# Patient Record
Sex: Female | Born: 1990 | Race: Black or African American | Hispanic: No | Marital: Single | Smoking: Never smoker
Health system: Southern US, Community
[De-identification: ages and names within clinical notes are randomized; demographics above are authoritative.]

## PROBLEM LIST (undated history)

## (undated) ENCOUNTER — Inpatient Hospital Stay (HOSPITAL_COMMUNITY): Payer: Self-pay

## (undated) DIAGNOSIS — Z3490 Encounter for supervision of normal pregnancy, unspecified, unspecified trimester: Secondary | ICD-10-CM

## (undated) DIAGNOSIS — N390 Urinary tract infection, site not specified: Secondary | ICD-10-CM

## (undated) DIAGNOSIS — F32A Depression, unspecified: Secondary | ICD-10-CM

## (undated) DIAGNOSIS — G43909 Migraine, unspecified, not intractable, without status migrainosus: Secondary | ICD-10-CM

## (undated) DIAGNOSIS — Z98891 History of uterine scar from previous surgery: Secondary | ICD-10-CM

## (undated) DIAGNOSIS — F329 Major depressive disorder, single episode, unspecified: Secondary | ICD-10-CM

## (undated) DIAGNOSIS — R87629 Unspecified abnormal cytological findings in specimens from vagina: Secondary | ICD-10-CM

## (undated) DIAGNOSIS — Z8489 Family history of other specified conditions: Secondary | ICD-10-CM

## (undated) DIAGNOSIS — B009 Herpesviral infection, unspecified: Secondary | ICD-10-CM

## (undated) DIAGNOSIS — F99 Mental disorder, not otherwise specified: Secondary | ICD-10-CM

## (undated) HISTORY — DX: Mental disorder, not otherwise specified: F99

## (undated) HISTORY — DX: Migraine, unspecified, not intractable, without status migrainosus: G43.909

## (undated) HISTORY — DX: Herpesviral infection, unspecified: B00.9

## (undated) HISTORY — DX: History of uterine scar from previous surgery: Z98.891

## (undated) HISTORY — DX: Encounter for supervision of normal pregnancy, unspecified, unspecified trimester: Z34.90

## (undated) HISTORY — DX: Unspecified abnormal cytological findings in specimens from vagina: R87.629

---
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
## 2013-08-03 LAB — OB RESULTS CONSOLE VARICELLA ZOSTER ANTIBODY, IGG: Varicella: IMMUNE

## 2013-08-03 LAB — OB RESULTS CONSOLE ABO/RH: RH TYPE: POSITIVE

## 2013-08-03 LAB — SICKLE CELL SCREEN: Sickle Cell Screen: NEGATIVE

## 2013-08-03 LAB — OB RESULTS CONSOLE HGB/HCT, BLOOD
HEMATOCRIT: 36 %
Hemoglobin: 11.9 g/dL

## 2013-08-03 LAB — OB RESULTS CONSOLE HIV ANTIBODY (ROUTINE TESTING): HIV: NONREACTIVE

## 2013-08-03 LAB — OB RESULTS CONSOLE RPR: RPR: NONREACTIVE

## 2013-08-03 LAB — OB RESULTS CONSOLE PLATELET COUNT: Platelets: 208 10*3/uL

## 2013-08-03 LAB — OB RESULTS CONSOLE TSH: TSH: 0.69

## 2013-08-03 LAB — OB RESULTS CONSOLE ANTIBODY SCREEN: Antibody Screen: NEGATIVE

## 2013-08-03 LAB — OB RESULTS CONSOLE HEPATITIS B SURFACE ANTIGEN: HEP B S AG: NEGATIVE

## 2013-08-03 LAB — OB RESULTS CONSOLE RUBELLA ANTIBODY, IGM: Rubella: IMMUNE

## 2013-08-07 LAB — HM PAP SMEAR

## 2013-08-07 LAB — OB RESULTS CONSOLE GC/CHLAMYDIA
Chlamydia: NEGATIVE
Gonorrhea: NEGATIVE

## 2013-11-22 LAB — OB RESULTS CONSOLE HGB/HCT, BLOOD
HCT: 33 %
Hemoglobin: 11.3 g/dL

## 2013-11-22 LAB — OB RESULTS CONSOLE PLATELET COUNT: PLATELETS: 190 10*3/uL

## 2014-01-23 ENCOUNTER — Encounter: Payer: Self-pay | Admitting: Advanced Practice Midwife

## 2014-01-23 ENCOUNTER — Ambulatory Visit (INDEPENDENT_AMBULATORY_CARE_PROVIDER_SITE_OTHER): Payer: Medicaid Other | Admitting: Advanced Practice Midwife

## 2014-01-23 VITALS — BP 110/66 | Ht 64.0 in | Wt 173.0 lb

## 2014-01-23 DIAGNOSIS — G43909 Migraine, unspecified, not intractable, without status migrainosus: Secondary | ICD-10-CM | POA: Insufficient documentation

## 2014-01-23 DIAGNOSIS — G43001 Migraine without aura, not intractable, with status migrainosus: Secondary | ICD-10-CM

## 2014-01-23 DIAGNOSIS — R8271 Bacteriuria: Secondary | ICD-10-CM | POA: Insufficient documentation

## 2014-01-23 DIAGNOSIS — A6000 Herpesviral infection of urogenital system, unspecified: Secondary | ICD-10-CM | POA: Insufficient documentation

## 2014-01-23 DIAGNOSIS — Z1389 Encounter for screening for other disorder: Secondary | ICD-10-CM

## 2014-01-23 DIAGNOSIS — Z3490 Encounter for supervision of normal pregnancy, unspecified, unspecified trimester: Secondary | ICD-10-CM

## 2014-01-23 DIAGNOSIS — Z3403 Encounter for supervision of normal first pregnancy, third trimester: Secondary | ICD-10-CM

## 2014-01-23 DIAGNOSIS — Z3400 Encounter for supervision of normal first pregnancy, unspecified trimester: Secondary | ICD-10-CM

## 2014-01-23 DIAGNOSIS — Z331 Pregnant state, incidental: Secondary | ICD-10-CM

## 2014-01-23 DIAGNOSIS — R011 Cardiac murmur, unspecified: Secondary | ICD-10-CM | POA: Insufficient documentation

## 2014-01-23 LAB — POCT URINALYSIS DIPSTICK
Blood, UA: NEGATIVE
Glucose, UA: NEGATIVE
Ketones, UA: NEGATIVE
LEUKOCYTES UA: NEGATIVE
NITRITE UA: NEGATIVE
PROTEIN UA: NEGATIVE

## 2014-01-23 NOTE — Progress Notes (Signed)
  Subjective:    Yvonne Stone is a G1P0 [id number] being seen today for her first obstetrical visit. She has just moved here from [place] where [place] has received care.  Her obstetrical history is significant for group B strep colonizer in urine this pregnancy.  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  Her 2 hr gtt was normal.  Normal anatomy scan.  Has had a few outbreaks of gential HSV2, on suppression therapy.  Pap showed LGSIL, recommended post partum evaluation.   Patient reports stuffy nose with some yellow drainage and some ear discomfort for 2-3 days  Filed Vitals:   01/23/14 1527 01/23/14 1531  BP: 110/66   Height:  5\' 4"  (1.626 m)  Weight: 78.472 kg (173 lb)     HISTORY: OB History  Gravida Para Term Preterm AB SAB TAB Ectopic Multiple Living  2    1 1         # Outcome Date GA Lbr Len/2nd Weight Sex Delivery Anes PTL Lv  2 CUR           1 SAB              Past Medical History  Diagnosis Date  . Migraines   . Vaginal Pap smear, abnormal    History reviewed. No pertinent past surgical history. Family History  Problem Relation Age of Onset  . Heart murmur Mother   . Hypertension Mother   . Diabetes Mother   . Heart murmur Sister   . Cancer Maternal Grandmother     breast   . Heart murmur Sister      Exam                                       HEENT:  Ears clear, throat is not red   Skin: normal coloration and turgor, no rashes    Neurologic: oriented, normal, normal mood   Extremities: normal strength, tone, and muscle mass   HEENT PERRLA   Mouth/Teeth mucous membranes moist, normal dentition   Neck supple and no masses   Cardiovascular: regular rate and rhythm   Respiratory:  appears well, vitals  normal, no respiratory distress, acyanotic   Abdomen: soft, non-tender;  FHR: 160          Assessment:    Pregnancy: G1P0 Patient Active Problem List   Diagnosis Date Noted  . 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 Heart murmur 01/23/2014  . Pregnancy 01/23/2014  . Group B streptococcal bacteriuria 01/23/2014  . Migraines         Plan:     Request records from cardiology consult Continue prenatal vitamins and acyclovir 400mg  TID Problem list reviewed and updated  If still URI sx Monday call for Abx  Follow up in 1 weeks for Low-risk ob appt   CRESENZO-DISHMAN,Donshay Lupinski 01/24/2014

## 2014-01-23 NOTE — Progress Notes (Signed)
Pt denies any problems or concerns at this time. Pt here for her New OB visit, [place] Pt states that she has had headache and brown mucus when blowing her nose and coughing, pt has had this for about 2 days. Pt was unsure of what she could take.

## 2014-01-27 ENCOUNTER — Other Ambulatory Visit: Payer: Self-pay | Admitting: *Deleted

## 2014-01-27 ENCOUNTER — Telehealth: Payer: Self-pay | Admitting: *Deleted

## 2014-01-27 ENCOUNTER — Telehealth: Payer: Self-pay | Admitting: Adult Health

## 2014-01-27 MED ORDER — PRENATAL 27-0.8 MG PO TABS: 1.0000 | ORAL_TABLET | Freq: Every day | ORAL | Status: DC

## 2014-01-27 NOTE — Telephone Encounter (Signed)
Spoke with pt. Pt is requesting a Rx for Valtrex 500 mg. Also, the cardiology office from [facility] is [facility] [address], [place], [place], [address]. Phone # [phone] and fax # [phone]. Thanks!!! [employer]

## 2014-01-27 NOTE — Telephone Encounter (Signed)
Refill request sent to [facility] for prenatal vits. [facility]

## 2014-01-28 ENCOUNTER — Encounter: Payer: Self-pay | Admitting: *Deleted

## 2014-01-28 ENCOUNTER — Telehealth: Payer: Self-pay | Admitting: *Deleted

## 2014-01-28 DIAGNOSIS — R87619 Unspecified abnormal cytological findings in specimens from cervix uteri: Secondary | ICD-10-CM | POA: Insufficient documentation

## 2014-01-28 DIAGNOSIS — R87612 Low grade squamous intraepithelial lesion on cytologic smear of cervix (LGSIL): Secondary | ICD-10-CM

## 2014-01-28 DIAGNOSIS — A6000 Herpesviral infection of urogenital system, unspecified: Secondary | ICD-10-CM

## 2014-01-28 DIAGNOSIS — R011 Cardiac murmur, unspecified: Secondary | ICD-10-CM

## 2014-01-28 DIAGNOSIS — Z3490 Encounter for supervision of normal pregnancy, unspecified, unspecified trimester: Secondary | ICD-10-CM

## 2014-01-28 DIAGNOSIS — R8271 Bacteriuria: Secondary | ICD-10-CM

## 2014-01-28 MED ORDER — VALACYCLOVIR HCL 1 G PO TABS: 500.0000 mg | ORAL_TABLET | Freq: Two times a day (BID) | ORAL | Status: DC

## 2014-01-28 NOTE — Telephone Encounter (Signed)
Request for records regarding cardiology consult faxed; valtrex  BID #[id number]

## 2014-01-28 NOTE — Telephone Encounter (Signed)
Pt called confused as to how many times a day she should be taking her Valtrex.  She states the rx bottle says to take it twice a day and Drenda Freeze told her to take it 3 X daily.  I spoke with Drenda Freeze and she states she was going to give the pt acyclovir that is 3 times daily but the pt wanted Valtrex so that is what she sent to the pharmacy and that medication is only 2 X daily, the patient states she is very confused and that since she left here last Thursday she has been taking the Valtrex 3 times a day.  I informed her that that is fine but to start taking just twice a day as prescribed.  Pt still confused so she put her mother on the phone and I explained it to her.  Pts mother verbalized understanding and repeated back to me that she will only take the medication twice daily from now on.

## 2014-01-30 ENCOUNTER — Encounter: Payer: Self-pay | Admitting: Obstetrics & Gynecology

## 2014-04-08 ENCOUNTER — Encounter: Payer: Self-pay | Admitting: *Deleted

## 2014-11-28 ENCOUNTER — Encounter (HOSPITAL_COMMUNITY): Payer: Self-pay | Admitting: *Deleted

## 2015-11-23 LAB — OB RESULTS CONSOLE GC/CHLAMYDIA
CHLAMYDIA, DNA PROBE: NEGATIVE
Chlamydia: NEGATIVE
GC PROBE AMP, GENITAL: NEGATIVE
Gonorrhea: NEGATIVE

## 2015-12-01 ENCOUNTER — Other Ambulatory Visit: Payer: Self-pay | Admitting: Obstetrics & Gynecology

## 2015-12-01 DIAGNOSIS — O3680X0 Pregnancy with inconclusive fetal viability, not applicable or unspecified: Secondary | ICD-10-CM

## 2015-12-02 ENCOUNTER — Encounter: Payer: Self-pay | Admitting: *Deleted

## 2015-12-03 ENCOUNTER — Ambulatory Visit (INDEPENDENT_AMBULATORY_CARE_PROVIDER_SITE_OTHER): Payer: Medicaid Other

## 2015-12-03 DIAGNOSIS — O3680X0 Pregnancy with inconclusive fetal viability, not applicable or unspecified: Secondary | ICD-10-CM | POA: Diagnosis not present

## 2015-12-03 DIAGNOSIS — Z3A01 Less than 8 weeks gestation of pregnancy: Secondary | ICD-10-CM | POA: Diagnosis not present

## 2015-12-03 NOTE — Progress Notes (Signed)
US 6+6 wks,single IUP w/ys,pos fht 131 bpm,normal ov's bilat,crl 10.1 mm

## 2015-12-16 ENCOUNTER — Other Ambulatory Visit (HOSPITAL_COMMUNITY)
Admission: RE | Admit: 2015-12-16 | Discharge: 2015-12-16 | Disposition: A | Discharge status: Home / Self Care | Payer: Medicaid Other | Source: Ambulatory Visit | Attending: Adult Health | Admitting: Adult Health

## 2015-12-16 ENCOUNTER — Ambulatory Visit (INDEPENDENT_AMBULATORY_CARE_PROVIDER_SITE_OTHER): Payer: Medicaid Other | Admitting: Adult Health

## 2015-12-16 ENCOUNTER — Encounter: Payer: Self-pay | Admitting: Adult Health

## 2015-12-16 VITALS — BP 106/80 | HR 84 | Wt 159.0 lb

## 2015-12-16 DIAGNOSIS — Z0283 Encounter for blood-alcohol and blood-drug test: Secondary | ICD-10-CM

## 2015-12-16 DIAGNOSIS — Z3490 Encounter for supervision of normal pregnancy, unspecified, unspecified trimester: Secondary | ICD-10-CM

## 2015-12-16 DIAGNOSIS — Z1389 Encounter for screening for other disorder: Secondary | ICD-10-CM

## 2015-12-16 DIAGNOSIS — Z331 Pregnant state, incidental: Secondary | ICD-10-CM | POA: Diagnosis not present

## 2015-12-16 DIAGNOSIS — Z01419 Encounter for gynecological examination (general) (routine) without abnormal findings: Secondary | ICD-10-CM | POA: Diagnosis present

## 2015-12-16 DIAGNOSIS — Z3A09 9 weeks gestation of pregnancy: Secondary | ICD-10-CM

## 2015-12-16 DIAGNOSIS — Z113 Encounter for screening for infections with a predominantly sexual mode of transmission: Secondary | ICD-10-CM | POA: Diagnosis present

## 2015-12-16 DIAGNOSIS — Z3481 Encounter for supervision of other normal pregnancy, first trimester: Secondary | ICD-10-CM | POA: Diagnosis not present

## 2015-12-16 DIAGNOSIS — Z3491 Encounter for supervision of normal pregnancy, unspecified, first trimester: Secondary | ICD-10-CM

## 2015-12-16 DIAGNOSIS — Z3682 Encounter for antenatal screening for nuchal translucency: Secondary | ICD-10-CM

## 2015-12-16 DIAGNOSIS — Z369 Encounter for antenatal screening, unspecified: Secondary | ICD-10-CM

## 2015-12-16 DIAGNOSIS — Z98891 History of uterine scar from previous surgery: Secondary | ICD-10-CM

## 2015-12-16 HISTORY — DX: Encounter for supervision of normal pregnancy, unspecified, unspecified trimester: Z34.90

## 2015-12-16 HISTORY — DX: History of uterine scar from previous surgery: Z98.891

## 2015-12-16 LAB — POCT URINALYSIS DIPSTICK
Glucose, UA: NEGATIVE
Ketones, UA: NEGATIVE
Leukocytes, UA: NEGATIVE
NITRITE UA: NEGATIVE

## 2015-12-16 NOTE — Patient Instructions (Signed)
Second Trimester of Pregnancy The second trimester is from week 13 through week 28, months 4 through 6. The second trimester is often a time when you feel your best. Your body has also adjusted to being pregnant, and you begin to feel better physically. Usually, morning sickness has lessened or quit completely, you may have more energy, and you may have an increase in appetite. The second trimester is also a time when the fetus is growing rapidly. At the end of the sixth month, the fetus is about 9 inches long and weighs about 1 pounds. You will likely begin to feel the baby move (quickening) between 18 and 20 weeks of the pregnancy. BODY CHANGES Your body goes through many changes during pregnancy. The changes vary from woman to woman.   Your weight will continue to increase. You will notice your lower abdomen bulging out.  You may begin to get stretch marks on your hips, abdomen, and breasts.  You may develop headaches that can be relieved by medicines approved by your health care provider.  You may urinate more often because the fetus is pressing on your bladder.  You may develop or continue to have heartburn as a result of your pregnancy.  You may develop constipation because certain hormones are causing the muscles that push waste through your intestines to slow down.  You may develop hemorrhoids or swollen, bulging veins (varicose veins).  You may have back pain because of the weight gain and pregnancy hormones relaxing your joints between the bones in your pelvis and as a result of a shift in weight and the muscles that support your balance.  Your breasts will continue to grow and be tender.  Your gums may bleed and may be sensitive to brushing and flossing.  Dark spots or blotches (chloasma, mask of pregnancy) may develop on your face. This will likely fade after the baby is born.  A dark line from your belly button to the pubic area (linea nigra) may appear. This will likely fade  after the baby is born.  You may have changes in your hair. These can include thickening of your hair, rapid growth, and changes in texture. Some women also have hair loss during or after pregnancy, or hair that feels dry or thin. Your hair will most likely return to normal after your baby is born. WHAT TO EXPECT AT YOUR PRENATAL VISITS During a routine prenatal visit:  You will be weighed to make sure you and the fetus are growing normally.  Your blood pressure will be taken.  Your abdomen will be measured to track your baby's growth.  The fetal heartbeat will be listened to.  Any test results from the previous visit will be discussed. Your health care provider may ask you:  How you are feeling.  If you are feeling the baby move.  If you have had any abnormal symptoms, such as leaking fluid, bleeding, severe headaches, or abdominal cramping.  If you are using any tobacco products, including cigarettes, chewing tobacco, and electronic cigarettes.  If you have any questions. Other tests that may be performed during your second trimester include:  Blood tests that check for:  Low iron levels (anemia).  Gestational diabetes (between 24 and 28 weeks).  Rh antibodies.  Urine tests to check for infections, diabetes, or protein in the urine.  An ultrasound to confirm the proper growth and development of the baby.  An amniocentesis to check for possible genetic problems.  Fetal screens for spina bifida   and Down syndrome.  HIV (human immunodeficiency virus) testing. Routine prenatal testing includes screening for HIV, unless you choose not to have this test. HOME CARE INSTRUCTIONS   Avoid all smoking, herbs, alcohol, and unprescribed drugs. These chemicals affect the formation and growth of the baby.  Do not use any tobacco products, including cigarettes, chewing tobacco, and electronic cigarettes. If you need help quitting, ask your health care provider. You may receive  counseling support and other resources to help you quit.  Follow your health care provider's instructions regarding medicine use. There are medicines that are either safe or unsafe to take during pregnancy.  Exercise only as directed by your health care provider. Experiencing uterine cramps is a good sign to stop exercising.  Continue to eat regular, healthy meals.  Wear a good support bra for breast tenderness.  Do not use hot tubs, steam rooms, or saunas.  Wear your seat belt at all times when driving.  Avoid raw meat, uncooked cheese, cat litter boxes, and soil used by cats. These carry germs that can cause birth defects in the baby.  Take your prenatal vitamins.  Take 1500-2000 mg of calcium daily starting at the 20th week of pregnancy until you deliver your baby.  Try taking a stool softener (if your health care provider approves) if you develop constipation. Eat more high-fiber foods, such as fresh vegetables or fruit and whole grains. Drink plenty of fluids to keep your urine clear or pale yellow.  Take warm sitz baths to soothe any pain or discomfort caused by hemorrhoids. Use hemorrhoid cream if your health care provider approves.  If you develop varicose veins, wear support hose. Elevate your feet for 15 minutes, 3-4 times a day. Limit salt in your diet.  Avoid heavy lifting, wear low heel shoes, and practice good posture.  Rest with your legs elevated if you have leg cramps or low back pain.  Visit your dentist if you have not gone yet during your pregnancy. Use a soft toothbrush to brush your teeth and be gentle when you floss.  A sexual relationship may be continued unless your health care provider directs you otherwise.  Continue to go to all your prenatal visits as directed by your health care provider. SEEK MEDICAL CARE IF:   You have dizziness.  You have mild pelvic cramps, pelvic pressure, or nagging pain in the abdominal area.  You have persistent nausea,  vomiting, or diarrhea.  You have a bad smelling vaginal discharge.  You have pain with urination. SEEK IMMEDIATE MEDICAL CARE IF:   You have a fever.  You are leaking fluid from your vagina.  You have spotting or bleeding from your vagina.  You have severe abdominal cramping or pain.  You have rapid weight gain or loss.  You have shortness of breath with chest pain.  You notice sudden or extreme swelling of your face, hands, ankles, feet, or legs.  You have not felt your baby move in over an hour.  You have severe headaches that do not go away with medicine.  You have vision changes.   This information is not intended to replace advice given to you by your health care provider. Make sure you discuss any questions you have with your health care provider.   Document Released: 05/17/2001 Document Revised: 06/13/2014 Document Reviewed: 07/24/2012 Elsevier Interactive Patient Education 2016 [place] of Pregnancy The first trimester of pregnancy is from week 1 until the end of week 12 (months 1 through  3). A week after a sperm fertilizes an egg, the egg will implant on the wall of the uterus. This embryo will begin to develop into a baby. Genes from you and your partner are forming the baby. The female genes determine whether the baby is a boy or a girl. At 6-8 weeks, the eyes and face are formed, and the heartbeat can be seen on ultrasound. At the end of 12 weeks, all the baby's organs are formed.  Now that you are pregnant, you will want to do everything you can to have a healthy baby. Two of the most important things are to get good prenatal care and to follow your health care provider's instructions. Prenatal care is all the medical care you receive before the baby's birth. This care will help prevent, find, and treat any problems during the pregnancy and childbirth. BODY CHANGES Your body goes through many changes during pregnancy. The changes vary from woman to  woman.   You may gain or lose a couple of pounds at first.  You may feel sick to your stomach (nauseous) and throw up (vomit). If the vomiting is uncontrollable, call your health care provider.  You may tire easily.  You may develop headaches that can be relieved by medicines approved by your health care provider.  You may urinate more often. Painful urination may mean you have a bladder infection.  You may develop heartburn as a result of your pregnancy.  You may develop constipation because certain hormones are causing the muscles that push waste through your intestines to slow down.  You may develop hemorrhoids or swollen, bulging veins (varicose veins).  Your breasts may begin to grow larger and become tender. Your nipples may stick out more, and the tissue that surrounds them (areola) may become darker.  Your gums may bleed and may be sensitive to brushing and flossing.  Dark spots or blotches (chloasma, mask of pregnancy) may develop on your face. This will likely fade after the baby is born.  Your menstrual periods will stop.  You may have a loss of appetite.  You may develop cravings for certain kinds of food.  You may have changes in your emotions from day to day, such as being excited to be pregnant or being concerned that something may go wrong with the pregnancy and baby.  You may have more vivid and strange dreams.  You may have changes in your hair. These can include thickening of your hair, rapid growth, and changes in texture. Some women also have hair loss during or after pregnancy, or hair that feels dry or thin. Your hair will most likely return to normal after your baby is born. WHAT TO EXPECT AT YOUR PRENATAL VISITS During a routine prenatal visit:  You will be weighed to make sure you and the baby are growing normally.  Your blood pressure will be taken.  Your abdomen will be measured to track your baby's growth.  The fetal heartbeat will be listened  to starting around week 10 or 12 of your pregnancy.  Test results from any previous visits will be discussed. Your health care provider may ask you:  How you are feeling.  If you are feeling the baby move.  If you have had any abnormal symptoms, such as leaking fluid, bleeding, severe headaches, or abdominal cramping.  If you are using any tobacco products, including cigarettes, chewing tobacco, and electronic cigarettes.  If you have any questions. Other tests that may be performed during your  first trimester include:  Blood tests to find your blood type and to check for the presence of any previous infections. They will also be used to check for low iron levels (anemia) and Rh antibodies. Later in the pregnancy, blood tests for diabetes will be done along with other tests if problems develop.  Urine tests to check for infections, diabetes, or protein in the urine.  An ultrasound to confirm the proper growth and development of the baby.  An amniocentesis to check for possible genetic problems.  Fetal screens for spina bifida and Down syndrome.  You may need other tests to make sure you and the baby are doing well.  HIV (human immunodeficiency virus) testing. Routine prenatal testing includes screening for HIV, unless you choose not to have this test. HOME CARE INSTRUCTIONS  Medicines  Follow your health care provider's instructions regarding medicine use. Specific medicines may be either safe or unsafe to take during pregnancy.  Take your prenatal vitamins as directed.  If you develop constipation, try taking a stool softener if your health care provider approves. Diet  Eat regular, well-balanced meals. Choose a variety of foods, such as meat or vegetable-based protein, fish, milk and low-fat dairy products, vegetables, fruits, and whole grain breads and cereals. Your health care provider will help you determine the amount of weight gain that is right for you.  Avoid raw  meat and uncooked cheese. These carry germs that can cause birth defects in the baby.  Eating four or five small meals rather than three large meals a day may help relieve nausea and vomiting. If you start to feel nauseous, eating a few soda crackers can be helpful. Drinking liquids between meals instead of during meals also seems to help nausea and vomiting.  If you develop constipation, eat more high-fiber foods, such as fresh vegetables or fruit and whole grains. Drink enough fluids to keep your urine clear or pale yellow. Activity and Exercise  Exercise only as directed by your health care provider. Exercising will help you:  Control your weight.  Stay in shape.  Be prepared for labor and delivery.  Experiencing pain or cramping in the lower abdomen or low back is a good sign that you should stop exercising. Check with your health care provider before continuing normal exercises.  Try to avoid standing for long periods of time. Move your legs often if you must stand in one place for a long time.  Avoid heavy lifting.  Wear low-heeled shoes, and practice good posture.  You may continue to have sex unless your health care provider directs you otherwise. Relief of Pain or Discomfort  Wear a good support bra for breast tenderness.   Take warm sitz baths to soothe any pain or discomfort caused by hemorrhoids. Use hemorrhoid cream if your health care provider approves.   Rest with your legs elevated if you have leg cramps or low back pain.  If you develop varicose veins in your legs, wear support hose. Elevate your feet for 15 minutes, 3-4 times a day. Limit salt in your diet. Prenatal Care  Schedule your prenatal visits by the twelfth week of pregnancy. They are usually scheduled monthly at first, then more often in the last 2 months before delivery.  Write down your questions. Take them to your prenatal visits.  Keep all your prenatal visits as directed by your health care  provider. Safety  Wear your seat belt at all times when driving.  Make a list of emergency phone  numbers, including numbers for family, friends, the hospital, and police and fire departments. General Tips  Ask your health care provider for a referral to a local prenatal education class. Begin classes no later than at the beginning of month 6 of your pregnancy.  Ask for help if you have counseling or nutritional needs during pregnancy. Your health care provider can offer advice or refer you to specialists for help with various needs.  Do not use hot tubs, steam rooms, or saunas.  Do not douche or use tampons or scented sanitary pads.  Do not cross your legs for long periods of time.  Avoid cat litter boxes and soil used by cats. These carry germs that can cause birth defects in the baby and possibly loss of the fetus by miscarriage or stillbirth.  Avoid all smoking, herbs, alcohol, and medicines not prescribed by your health care provider. Chemicals in these affect the formation and growth of the baby.  Do not use any tobacco products, including cigarettes, chewing tobacco, and electronic cigarettes. If you need help quitting, ask your health care provider. You may receive counseling support and other resources to help you quit.  Schedule a dentist appointment. At home, brush your teeth with a soft toothbrush and be gentle when you floss. SEEK MEDICAL CARE IF:   You have dizziness.  You have mild pelvic cramps, pelvic pressure, or nagging pain in the abdominal area.  You have persistent nausea, vomiting, or diarrhea.  You have a bad smelling vaginal discharge.  You have pain with urination.  You notice increased swelling in your face, hands, legs, or ankles. SEEK IMMEDIATE MEDICAL CARE IF:   You have a fever.  You are leaking fluid from your vagina.  You have spotting or bleeding from your vagina.  You have severe abdominal cramping or pain.  You have rapid weight gain  or loss.  You vomit blood or material that looks like coffee grounds.  You are exposed to [place] and have never had them.  You are exposed to fifth disease or chickenpox.  You develop a severe headache.  You have shortness of breath.  You have any kind of trauma, such as from a fall or a car accident.   This information is not intended to replace advice given to you by your health care provider. Make sure you discuss any questions you have with your health care provider.   Document Released: 05/17/2001 Document Revised: 06/13/2014 Document Reviewed: 04/02/2013 Elsevier Interactive Patient Education [employer] Return in 4 weeks for It/NT and see Dr Emelda FearFerguson

## 2015-12-16 NOTE — Progress Notes (Signed)
Subjective:  Yvonne Stone is a 25 y.o. 193P1011 African American female at 241w5d by US being seen today for her first obstetrical visit.  Her obstetrical history is significant for had Csection with priod delivery for FTD and fetal distressalso had postpartun depression.  Pregnancy history fully reviewed.  Patient reports no complaints. Denies vb, cramping, uti s/s, abnormal/malodorous vag d/c, or vulvovaginal irritation, has had some itching and used monistat.Has some pain in stomach at times.  BP 106/80 mmHg  Pulse 84  Wt 159 lb (72.122 kg)  LMP 08/19/2015 (Approximate)  HISTORY: OB History  Gravida Para Term Preterm AB SAB TAB Ectopic Multiple Living  3 1 1  1 1    1     # Outcome Date GA Lbr Len/2nd Weight Sex Delivery Anes PTL Lv  3 Current           2 Term 02/22/14 1286w6d  6 lb 10 oz (3.005 kg) M CS-LTranv   Y  1 SAB              Past Medical History  Diagnosis Date  . Migraines   . Vaginal Pap smear, abnormal   . Herpes     genital  . Mental disorder     postpartum depression  . Supervision of normal pregnancy 12/16/2015     [facility] at   8+5 weeks FOB  Corwin Levinswilliam smith 25 yo bm 11th child Dating By  US Pap  12/16/15 GC/CT Initial:                36+wks: Genetic Screen NT/IT:  CF screen  Anatomic US  Flu vaccine  Tdap Recommended ~ 28wks Glucose Screen  2 hr GBS  Feed Preference  Contraception  Circumcision  Childbirth Classes  Pediatrician     History reviewed. No pertinent past surgical history. Family History  Problem Relation Age of Onset  . Heart murmur Mother   . Hypertension Mother   . Diabetes Mother   . Thyroid disease Mother   . Heart murmur Sister   . Cancer Maternal Grandmother     breast   . Hypertension Maternal Grandmother   . Heart murmur Sister   . Hypertension Father   . Hypertension Maternal Grandfather   . Thyroid disease Maternal Aunt   . Hypertension Maternal Aunt   . Diabetes Maternal Aunt   . Diabetes Maternal Uncle    . Hypertension Maternal Uncle   . Diabetes Paternal Aunt   . Hypertension Paternal Aunt   . Hypertension Paternal Uncle   . Diabetes Paternal Uncle   . Seizures Paternal Grandmother   . Stroke Paternal Grandmother   . Hypertension Paternal Grandmother   . Diabetes Paternal Grandfather   . Hypertension Paternal Grandfather     Exam   System:     General: Well developed & nourished, no acute distress   Skin: Warm & dry, normal coloration and turgor, no rashes   Neurologic: Alert & oriented, normal mood   Cardiovascular: Regular rate & rhythm   Respiratory: Effort & rate normal, LCTAB, acyanotic   Abdomen: Soft, non tender   Extremities: normal strength, tone   Pelvic Exam:    Perineum: Normal perineum   Vulva: Normal, no lesions   Vagina:  Normal mucosa, normal discharge   Cervix: Normal, bulbous, appears closed   Uterus: Normal size/shape/contour for GA   Thin prep pap smear with GC/CHl done. FHR: 171 via doppler.   Assessment:   Pregnancy: G3P1011 Patient Active  Problem List   Diagnosis Date Noted  . Supervision of normal pregnancy 12/16/2015  . 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 Heart murmur 01/23/2014  . Pregnancy 01/23/2014  . Group B streptococcal bacteriuria 01/23/2014  . Migraines     [redacted]w[redacted]d G3P1011 New OB visit     Plan:  Initial labs drawn Continue prenatal vitamins Problem list reviewed and updated Reviewed n/v relief measures and warning s/s to report Reviewed recommended weight gain based on pre-gravid BMI Encouraged well-balanced diet Genetic Screening discussed Integrated Screen: requested Cystic fibrosis screening discussed requested Ultrasound discussed; fetal survey: requested Follow up in 4 weeks for IT/NT and see Dr Emelda Fear  Adline Potter, NP 12/16/2015 11:41 AM

## 2015-12-17 LAB — URINE CULTURE

## 2015-12-17 LAB — CYTOLOGY - PAP

## 2015-12-24 LAB — CYSTIC FIBROSIS MUTATION 97: GENE DIS ANAL CARRIER INTERP BLD/T-IMP: NOT DETECTED

## 2015-12-24 LAB — PMP SCREEN PROFILE (10S), URINE
Amphetamine Screen, Ur: NEGATIVE ng/mL
BARBITURATE SCRN UR: NEGATIVE ng/mL
BENZODIAZEPINE SCREEN, URINE: NEGATIVE ng/mL
CREATININE(CRT), U: 247.7 mg/dL (ref 20.0–300.0)
Cannabinoids Ur Ql Scn: NEGATIVE ng/mL
Cocaine(Metab.)Screen, Urine: NEGATIVE ng/mL
METHADONE SCREEN, URINE: NEGATIVE ng/mL
Opiate Scrn, Ur: NEGATIVE ng/mL
Oxycodone+Oxymorphone Ur Ql Scn: NEGATIVE ng/mL
PCP Scrn, Ur: NEGATIVE ng/mL
PH UR, DRUG SCRN: 6.1 (ref 4.5–8.9)
PROPOXYPHENE SCREEN: NEGATIVE ng/mL

## 2015-12-24 LAB — CBC
HEMOGLOBIN: 11.8 g/dL (ref 11.1–15.9)
Hematocrit: 35.8 % (ref 34.0–46.6)
MCH: 26.9 pg (ref 26.6–33.0)
MCHC: 33 g/dL (ref 31.5–35.7)
MCV: 82 fL (ref 79–97)
Platelets: 237 10*3/uL (ref 150–379)
RBC: 4.38 x10E6/uL (ref 3.77–5.28)
RDW: 13.7 % (ref 12.3–15.4)
WBC: 5.1 10*3/uL (ref 3.4–10.8)

## 2015-12-24 LAB — URINALYSIS, ROUTINE W REFLEX MICROSCOPIC
Bilirubin, UA: NEGATIVE
GLUCOSE, UA: NEGATIVE
KETONES UA: NEGATIVE
Leukocytes, UA: NEGATIVE
NITRITE UA: NEGATIVE
RBC, UA: NEGATIVE
UUROB: 0.2 mg/dL (ref 0.2–1.0)
pH, UA: 6.5 (ref 5.0–7.5)

## 2015-12-24 LAB — VARICELLA ZOSTER ANTIBODY, IGG: Varicella zoster IgG: 866 index (ref >165)

## 2015-12-24 LAB — RUBELLA SCREEN: RUBELLA: 2.06 {index} (ref >0.99)

## 2015-12-24 LAB — ABO/RH: Rh Factor: POSITIVE

## 2015-12-24 LAB — HIV ANTIBODY (ROUTINE TESTING W REFLEX): HIV SCREEN 4TH GENERATION: NONREACTIVE

## 2015-12-24 LAB — RPR: RPR Ser Ql: NONREACTIVE

## 2015-12-24 LAB — HEPATITIS B SURFACE ANTIGEN: HEP B S AG: NEGATIVE

## 2015-12-24 LAB — ANTIBODY SCREEN: ANTIBODY SCREEN: NEGATIVE

## 2015-12-31 ENCOUNTER — Telehealth: Payer: Self-pay | Admitting: *Deleted

## 2015-12-31 NOTE — Telephone Encounter (Signed)
Spoke with pt. Pt states she hasn't had a BM in 3 days. I advised to eat 4 prunes daily or drink prune juice and drink plenty of other fluids as well. Pt was advised to let us know if this don't help. Pt voiced understanding. JSY

## 2016-01-13 ENCOUNTER — Ambulatory Visit (INDEPENDENT_AMBULATORY_CARE_PROVIDER_SITE_OTHER): Payer: Medicaid Other | Admitting: Obstetrics and Gynecology

## 2016-01-13 ENCOUNTER — Ambulatory Visit (INDEPENDENT_AMBULATORY_CARE_PROVIDER_SITE_OTHER): Payer: Medicaid Other

## 2016-01-13 VITALS — BP 92/70 | HR 84 | Wt 162.3 lb

## 2016-01-13 DIAGNOSIS — Z3A13 13 weeks gestation of pregnancy: Secondary | ICD-10-CM

## 2016-01-13 DIAGNOSIS — Z3491 Encounter for supervision of normal pregnancy, unspecified, first trimester: Secondary | ICD-10-CM

## 2016-01-13 DIAGNOSIS — Z3492 Encounter for supervision of normal pregnancy, unspecified, second trimester: Secondary | ICD-10-CM

## 2016-01-13 DIAGNOSIS — Z3682 Encounter for antenatal screening for nuchal translucency: Secondary | ICD-10-CM

## 2016-01-13 DIAGNOSIS — Z1389 Encounter for screening for other disorder: Secondary | ICD-10-CM

## 2016-01-13 DIAGNOSIS — Z36 Encounter for antenatal screening of mother: Secondary | ICD-10-CM | POA: Diagnosis not present

## 2016-01-13 DIAGNOSIS — Z331 Pregnant state, incidental: Secondary | ICD-10-CM

## 2016-01-13 LAB — POCT URINALYSIS DIPSTICK
Blood, UA: NEGATIVE
Glucose, UA: NEGATIVE
KETONES UA: NEGATIVE
LEUKOCYTES UA: NEGATIVE
Nitrite, UA: NEGATIVE
PROTEIN UA: NEGATIVE

## 2016-01-13 NOTE — Patient Instructions (Signed)
25 y.o. at 323w5d with Estimated Date of Delivery: 07/22/16 was seen today in office to discuss trial of labor after cesarean section (TOLAC) versus elective repeat cesarean delivery (ERCD).   The patient has a previous obstetric history [facility] after reaching completely dilated but being unable to deliver vaginally after several hours..   The following risks were discussed with the patient: Risk of uterine rupture at term is 0.78 percent with TOLAC,   0.22 percent with ERCD.   1 in 10 uterine ruptures will result in neonatal death or neurological injury. The benefits of TOLAC resulting in a vaginal birth after cesarean (VBAC) include the following: shorter length of hospital stay and postpartum recovery (in most cases);                 fewer complications, such as postpartum fever, wound or uterine infection,  thromboembolism (blood clots in the leg or lung), need for blood transfusion and fewer neonatal breathing problems. The risks of an attempted TOLAC include the following: Risk of failed TOLAC without a vaginal birth after cesarean (VBAC) resulting in repeat cesarean delivery in about 20 to 40 percent of women who attempt VBAC.  Risk of rupture of uterus resulting in an emergency cesarean delivery. The risk of uterine rupture may be related in part to the type of uterine incision made during the first cesarean delivery. A previous transverse uterine incision has the lowest risk of rupture (0.2 to 1.5 percent risk). Vertical or T-shaped uterine incisions have a higher risk of uterine rupture (4 to 9 percent risk). The risk of fetal death is very low with both VBAC and elective repeat cesarean delivery, but the likelihood of fetal death is higher with VBAC than with ERCD. Maternal death is very rare with either type of delivery.  The risks of a repeat cesarean section were reviewed with the patient including but not limited to: 07/998 risk of uterine rupture which  could have serious consequences, bleeding which may require transfusion; infection which may require antibiotics; injury to bowel, bladder or other surrounding organs (bowel, bladder, ureters); injury to the fetus; need for additional procedures including hysterectomy in the event of a life-threatening hemorrhage; thromboembolic phenomenon, incisional problems and other postoperative/anesthesia complications.    Once  All her questionsare answered and she will sign a consent indicating a preference for repeat cesarean section, or for TOLAC. curently she is more likely to request repeat cesarean section

## 2016-01-13 NOTE — Progress Notes (Signed)
US 12+5 wks,measurements c/w dates,normal ov's bilat,NB present,NT 1.6 mm,crl 66.429mm,fhr 153 bpm,post pl gr 0

## 2016-01-13 NOTE — Progress Notes (Signed)
Patient ID: Yvonne Stone, female   DOB: 11-Feb-1991, 25 y.o.   MRN: [id number]  [id number]  Estimated Date of Delivery: 07/22/16 LROB 1239w5d  Blood pressure 92/70, pulse 84, weight 162 lb 4.8 oz (73.6 kg), last menstrual period 08/19/2015, unknown if currently breastfeeding.    Urine results: notable for none  Chief Complaint  Patient presents with  . Routine Prenatal Visit    NT IT today    Patient complaints: none. Pt reports she is considering a repeat cesarean section. She reports [facility]/t failure to descend and fetal distress.   Patient reports too early for fetal movement. She denies any bleeding, rupture of membranes, or regular contractions.  Refer to the ob flow sheet for FH and FHR.    Physical Examination: General appearance - alert, well appearing, and in no distress                                      Abdomen - FHR 153 bpm                                                         soft, nontender, nondistended, no masses or organomegaly  Discussed with pt risks and benefits of VBAC vs repeat cesarean section. At end of discussion, pt had opportunity to ask questions and has no further questions at this time. She reports she is still considering repeat c-section at this time.   Greater than 50% was spent in counseling and coordination of care with the patient. Total time greater than: 25 minutes                                           Questions were answered. Assessment: LROB G3P1011 @ 7039w5d NT/IT today   Plan:  Continued routine obstetrical care Lactation consultant and childbirth classes information provided  [facility] pertaining to prior Cesarean section Internal exam at next visit to assess pelvic bones   Pt provided with VBAC informed consent handout   F/u in 4 weeks for routine prenatal care, internal exam  Once  All her questionsare answered and she will sign a consent indicating a preference  for repeat cesarean section, or for TOLAC. curently she is more likely to request repeat cesarean section      By signing my name below, I, Doreatha MartinEva Mathews, attest that this documentation has been prepared under the direction and in the presence of Tilda BurrowJohn V Taneil Lazarus, MD. Electronically Signed: Doreatha MartinEva Mathews, ED Scribe. 01/13/16. 11:35 AM.  I personally performed the services described in this documentation, which was SCRIBED in my presence. The recorded information has been reviewed and considered accurate. It has been edited as necessary during review. Tilda BurrowFERGUSON,Almetta Liddicoat V, MD

## 2016-01-16 LAB — MATERNAL SCREEN, INTEGRATED #1
Crown Rump Length: 66.9 mm
Gest. Age on Collection Date: 12.9 weeks
MATERNAL AGE AT EDD: 24.3 a
NUCHAL TRANSLUCENCY (NT): 1.6 mm
Number of Fetuses: 1
PAPP-A VALUE: 1802.9 ng/mL
PDF: 0
WEIGHT: 162 [lb_av]

## 2016-02-10 ENCOUNTER — Encounter: Payer: Medicaid Other | Admitting: Women's Health

## 2016-02-15 ENCOUNTER — Ambulatory Visit (INDEPENDENT_AMBULATORY_CARE_PROVIDER_SITE_OTHER): Payer: Medicaid Other | Admitting: Women's Health

## 2016-02-15 ENCOUNTER — Encounter: Payer: Self-pay | Admitting: Women's Health

## 2016-02-15 VITALS — BP 116/74 | HR 88 | Wt 170.0 lb

## 2016-02-15 DIAGNOSIS — Z3682 Encounter for antenatal screening for nuchal translucency: Secondary | ICD-10-CM

## 2016-02-15 DIAGNOSIS — Z331 Pregnant state, incidental: Secondary | ICD-10-CM

## 2016-02-15 DIAGNOSIS — O9989 Other specified diseases and conditions complicating pregnancy, childbirth and the puerperium: Secondary | ICD-10-CM

## 2016-02-15 DIAGNOSIS — Z98891 History of uterine scar from previous surgery: Secondary | ICD-10-CM

## 2016-02-15 DIAGNOSIS — Z8759 Personal history of other complications of pregnancy, childbirth and the puerperium: Secondary | ICD-10-CM

## 2016-02-15 DIAGNOSIS — Z3492 Encounter for supervision of normal pregnancy, unspecified, second trimester: Secondary | ICD-10-CM

## 2016-02-15 DIAGNOSIS — A6000 Herpesviral infection of urogenital system, unspecified: Secondary | ICD-10-CM

## 2016-02-15 DIAGNOSIS — Z363 Encounter for antenatal screening for malformations: Secondary | ICD-10-CM

## 2016-02-15 DIAGNOSIS — Z1389 Encounter for screening for other disorder: Secondary | ICD-10-CM

## 2016-02-15 DIAGNOSIS — Z8659 Personal history of other mental and behavioral disorders: Secondary | ICD-10-CM | POA: Insufficient documentation

## 2016-02-15 LAB — POCT URINALYSIS DIPSTICK
Glucose, UA: NEGATIVE
Ketones, UA: NEGATIVE
LEUKOCYTES UA: NEGATIVE
NITRITE UA: NEGATIVE
PROTEIN UA: NEGATIVE
RBC UA: NEGATIVE

## 2016-02-15 NOTE — Patient Instructions (Addendum)
[place] Pediatricians/Family Doctors:  [facility] [phone]                 [place] [phone] (usually not accepting new patients unless you have family there already, you are always welcome to call and ask)            [facility] ([place]) [phone]   [facility] Pediatricians/Family Doctors:   [place]: [phone]  [facility]: [phone]   Second Trimester of Pregnancy The second trimester is from week 13 through week 28, months 4 through 6. The second trimester is often a time when you feel your best. Your body has also adjusted to being pregnant, and you begin to feel better physically. Usually, morning sickness has lessened or quit completely, you may have more energy, and you may have an increase in appetite. The second trimester is also a time when the fetus is growing rapidly. At the end of the sixth month, the fetus is about 9 inches long and weighs about 1 pounds. You will likely begin to feel the baby move (quickening) between 18 and 20 weeks of the pregnancy. BODY CHANGES Your body goes through many changes during pregnancy. The changes vary from woman to woman.   Your weight will continue to increase. You will notice your lower abdomen bulging out.  You may begin to get stretch marks on your hips, abdomen, and breasts.  You may develop headaches that can be relieved by medicines approved by your health care provider.  You may urinate more often because the fetus is pressing on your bladder.  You may develop or continue to have heartburn as a result of your pregnancy.  You may develop constipation because certain hormones are causing the muscles that push waste through your intestines to slow down.  You may develop hemorrhoids or swollen, bulging veins (varicose veins).  You may have back pain because of the weight  gain and pregnancy hormones relaxing your joints between the bones in your pelvis and as a result of a shift in weight and the muscles that support your balance.  Your breasts will continue to grow and be tender.  Your gums may bleed and may be sensitive to brushing and flossing.  Dark spots or blotches (chloasma, mask of pregnancy) may develop on your face. This will likely fade after the baby is born.  A dark line from your belly button to the pubic area (linea nigra) may appear. This will likely fade after the baby is born.  You may have changes in your hair. These can include thickening of your hair, rapid growth, and changes in texture. Some women also have hair loss during or after pregnancy, or hair that feels dry or thin. Your hair will most likely return to normal after your baby is born. WHAT TO EXPECT AT YOUR PRENATAL VISITS During a routine prenatal visit:  You will be weighed to make sure you and the fetus are growing normally.  Your blood pressure will be taken.  Your abdomen will be measured to track your baby's growth.  The fetal heartbeat will be listened to.  Any test results from the previous visit will be discussed. Your health care provider may ask you:  How you are feeling.  If you are feeling the baby move.  If you have had any abnormal symptoms, such as leaking fluid, bleeding, severe  headaches, or abdominal cramping.  If you are using any tobacco products, including cigarettes, chewing tobacco, and electronic cigarettes.  If you have any questions. Other tests that may be performed during your second trimester include:  Blood tests that check for:  Low iron levels (anemia).  Gestational diabetes (between 24 and 28 weeks).  Rh antibodies.  Urine tests to check for infections, diabetes, or protein in the urine.  An ultrasound to confirm the proper growth and development of the baby.  An amniocentesis to check for possible genetic  problems.  Fetal screens for spina bifida and Down syndrome.  HIV (human immunodeficiency virus) testing. Routine prenatal testing includes screening for HIV, unless you choose not to have this test. HOME CARE INSTRUCTIONS   Avoid all smoking, herbs, alcohol, and unprescribed drugs. These chemicals affect the formation and growth of the baby.  Do not use any tobacco products, including cigarettes, chewing tobacco, and electronic cigarettes. If you need help quitting, ask your health care provider. You may receive counseling support and other resources to help you quit.  Follow your health care provider's instructions regarding medicine use. There are medicines that are either safe or unsafe to take during pregnancy.  Exercise only as directed by your health care provider. Experiencing uterine cramps is a good sign to stop exercising.  Continue to eat regular, healthy meals.  Wear a good support bra for breast tenderness.  Do not use hot tubs, steam rooms, or saunas.  Wear your seat belt at all times when driving.  Avoid raw meat, uncooked cheese, cat litter boxes, and soil used by cats. These carry germs that can cause birth defects in the baby.  Take your prenatal vitamins.  Take 1500-2000 mg of calcium daily starting at the 20th week of pregnancy until you deliver your baby.  Try taking a stool softener (if your health care provider approves) if you develop constipation. Eat more high-fiber foods, such as fresh vegetables or fruit and whole grains. Drink plenty of fluids to keep your urine clear or pale yellow.  Take warm sitz baths to soothe any pain or discomfort caused by hemorrhoids. Use hemorrhoid cream if your health care provider approves.  If you develop varicose veins, wear support hose. Elevate your feet for 15 minutes, 3-4 times a day. Limit salt in your diet.  Avoid heavy lifting, wear low heel shoes, and practice good posture.  Rest with your legs elevated if you  have leg cramps or low back pain.  Visit your dentist if you have not gone yet during your pregnancy. Use a soft toothbrush to brush your teeth and be gentle when you floss.  A sexual relationship may be continued unless your health care provider directs you otherwise.  Continue to go to all your prenatal visits as directed by your health care provider. SEEK MEDICAL CARE IF:   You have dizziness.  You have mild pelvic cramps, pelvic pressure, or nagging pain in the abdominal area.  You have persistent nausea, vomiting, or diarrhea.  You have a bad smelling vaginal discharge.  You have pain with urination. SEEK IMMEDIATE MEDICAL CARE IF:   You have a fever.  You are leaking fluid from your vagina.  You have spotting or bleeding from your vagina.  You have severe abdominal cramping or pain.  You have rapid weight gain or loss.  You have shortness of breath with chest pain.  You notice sudden or extreme swelling of your face, hands, ankles, feet, or legs.  You have not felt your baby move in over an hour.  You have severe headaches that do not go away with medicine.  You have vision changes.   This information is not intended to replace advice given to you by your health care provider. Make sure you discuss any questions you have with your health care provider.   Document Released: 05/17/2001 Document Revised: 06/13/2014 Document Reviewed: 07/24/2012 Elsevier Interactive Patient Education 2016 [employer] Inc.  

## 2016-02-15 NOTE — Progress Notes (Signed)
Low-risk OB appointment G3P1011 8227w3d Estimated Date of Delivery: 07/22/16 BP 116/74   Pulse 88   Wt 170 lb (77.1 kg)   LMP 08/19/2015 (Approximate)   BMI 29.18 kg/m   BP, weight, and urine reviewed.  Refer to obstetrical flow sheet for FH & FHR.  No fm yet. Denies cramping, lof, vb, or uti s/s. No complaints. Has decided she definitely wants RLTCS, declines VBAC.  Reviewed warning s/s to report. Plan:  Continue routine obstetrical care  F/U in 2wks for OB appointment and anatomy u/s 2nd IT today

## 2016-02-18 LAB — MATERNAL SCREEN, INTEGRATED #2
AFP MARKER: 48.5 ng/mL
AFP MoM: 1.21
CROWN RUMP LENGTH: 66.9 mm
DIA MoM: 0.65
DIA VALUE: 107 pg/mL
Estriol, Unconjugated: 1.45 ng/mL
GEST. AGE ON COLLECTION DATE: 12.9 wk
GESTATIONAL AGE: 17.6 wk
HCG MOM: 1
HCG VALUE: 24.8 [IU]/mL
MATERNAL AGE AT EDD: 24.3 a
NUCHAL TRANSLUCENCY (NT): 1.6 mm
Nuchal Translucency MoM: 0.96
Number of Fetuses: 1
PAPP-A MOM: 1.81
PAPP-A VALUE: 1802.9 ng/mL
Test Results:: NEGATIVE
WEIGHT: 162 [lb_av]
Weight: 162 [lb_av]
uE3 MoM: 1.3

## 2016-02-22 ENCOUNTER — Ambulatory Visit (INDEPENDENT_AMBULATORY_CARE_PROVIDER_SITE_OTHER): Payer: Medicaid Other | Admitting: Obstetrics & Gynecology

## 2016-02-22 ENCOUNTER — Encounter: Payer: Self-pay | Admitting: Obstetrics & Gynecology

## 2016-02-22 ENCOUNTER — Telehealth: Payer: Self-pay | Admitting: Women's Health

## 2016-02-22 VITALS — BP 92/60 | HR 84 | Wt 170.0 lb

## 2016-02-22 DIAGNOSIS — Z118 Encounter for screening for other infectious and parasitic diseases: Secondary | ICD-10-CM

## 2016-02-22 DIAGNOSIS — Z3482 Encounter for supervision of other normal pregnancy, second trimester: Secondary | ICD-10-CM

## 2016-02-22 DIAGNOSIS — Z3492 Encounter for supervision of normal pregnancy, unspecified, second trimester: Secondary | ICD-10-CM

## 2016-02-22 DIAGNOSIS — N898 Other specified noninflammatory disorders of vagina: Secondary | ICD-10-CM

## 2016-02-22 DIAGNOSIS — Z331 Pregnant state, incidental: Secondary | ICD-10-CM

## 2016-02-22 DIAGNOSIS — Z1389 Encounter for screening for other disorder: Secondary | ICD-10-CM

## 2016-02-22 DIAGNOSIS — Z1159 Encounter for screening for other viral diseases: Secondary | ICD-10-CM

## 2016-02-22 DIAGNOSIS — O26892 Other specified pregnancy related conditions, second trimester: Secondary | ICD-10-CM

## 2016-02-22 DIAGNOSIS — Z3A19 19 weeks gestation of pregnancy: Secondary | ICD-10-CM

## 2016-02-22 LAB — POCT URINALYSIS DIPSTICK
Blood, UA: NEGATIVE
Glucose, UA: NEGATIVE
KETONES UA: NEGATIVE
Leukocytes, UA: NEGATIVE
Nitrite, UA: NEGATIVE
PROTEIN UA: NEGATIVE

## 2016-02-22 NOTE — Telephone Encounter (Signed)
Pt states she is having a thick white D/C with odor and is wanting to come in and have it checked out.  Call sent to front office staff for appointment to be scheduled.

## 2016-02-22 NOTE — Progress Notes (Signed)
       Chief Complaint  Patient presents with  . work-in-ob    greenish vaginal discharge x 1 month.    Blood pressure 92/60, pulse 84, weight 170 lb (77.1 kg), last menstrual period 08/19/2015, unknown if currently breastfeeding.  25 y.o. G3P1011 Patient's last menstrual period was 08/19/2015 (approximate). The current method of family planning is pregnant.  Subjective Vaginal discharge for 3months Itching no Irritation no Odor no Similar to previous no  Previous treatment   Objective Vulva:  normal appearing vulva with no masses, tenderness or lesions Vagina:  normal mucosa, no discharge Cervix:  no cervical motion tenderness and no lesions Uterus:   Adnexa: ovaries:,       Pertinent ROS   Labs or studies Wet Prep:   A sample of vaginal discharge was obtained from the posterior fornix using a cotton swab. 2 drops of saline were placed on a slide and the cotton swab was immersed in the saline. Microscopic evaluation was performed and results were as follows:  Negative  for yeast  Negative for clue cells , consistent with Bacterial vaginosis Negative for trichomonas  Normal WBC population   Whiff test: Negative     Impression Diagnoses this Encounter::   ICD-9-CM ICD-10-CM   1. Supervision of normal pregnancy, second trimester V22.1 Z34.92   2. Pregnant state, incidental V22.2 Z33.1 POCT urinalysis dipstick  3. Screening for genitourinary condition V81.6 Z13.89 POCT urinalysis dipstick  4. Vaginal discharge during pregnancy in second trimester 646.83 O26.892 POCT Wet Prep [facility]   623.5 N89.8   5. Screening for viral and chlamydial diseases V73.98 Z11.59 GC/Chlamydia Probe Amp   V73.99 Z11.8     Established relevant diagnosis(es):   Plan/Recommendations: No orders of the defined types were placed in this encounter.   Labs or Scans Ordered: Orders Placed This Encounter  Procedures  . GC/Chlamydia Probe Amp  . POCT urinalysis dipstick  .  POCT Wet Prep [facility]([place])    Management:: Normal cervical mucous  Follow up Return for keep scheduled.       All questions were answered.

## 2016-02-25 LAB — GC/CHLAMYDIA PROBE AMP
CHLAMYDIA, DNA PROBE: NEGATIVE
NEISSERIA GONORRHOEAE BY PCR: NEGATIVE

## 2016-02-29 ENCOUNTER — Encounter: Payer: Self-pay | Admitting: Women's Health

## 2016-02-29 ENCOUNTER — Ambulatory Visit (INDEPENDENT_AMBULATORY_CARE_PROVIDER_SITE_OTHER): Payer: Medicaid Other

## 2016-02-29 ENCOUNTER — Ambulatory Visit (INDEPENDENT_AMBULATORY_CARE_PROVIDER_SITE_OTHER): Payer: Medicaid Other | Admitting: Women's Health

## 2016-02-29 VITALS — BP 98/60 | HR 72 | Wt 169.0 lb

## 2016-02-29 DIAGNOSIS — Z1389 Encounter for screening for other disorder: Secondary | ICD-10-CM

## 2016-02-29 DIAGNOSIS — Z363 Encounter for antenatal screening for malformations: Secondary | ICD-10-CM

## 2016-02-29 DIAGNOSIS — Z3492 Encounter for supervision of normal pregnancy, unspecified, second trimester: Secondary | ICD-10-CM

## 2016-02-29 DIAGNOSIS — Z36 Encounter for antenatal screening of mother: Secondary | ICD-10-CM

## 2016-02-29 DIAGNOSIS — Z3A20 20 weeks gestation of pregnancy: Secondary | ICD-10-CM

## 2016-02-29 DIAGNOSIS — Z331 Pregnant state, incidental: Secondary | ICD-10-CM

## 2016-02-29 DIAGNOSIS — Z3482 Encounter for supervision of other normal pregnancy, second trimester: Secondary | ICD-10-CM

## 2016-02-29 LAB — POCT URINALYSIS DIPSTICK
GLUCOSE UA: NEGATIVE
Leukocytes, UA: NEGATIVE
Nitrite, UA: NEGATIVE
Protein, UA: NEGATIVE
RBC UA: NEGATIVE

## 2016-02-29 NOTE — Progress Notes (Signed)
US 19+3 wks,cephalic,post pl gr 0,normal ov's bilat,cx 4.3 cm,svp of fluid 6.4 cm,fhr 162 bpm,efw 336 g,anatomy complete,no obvious abnormalities seen

## 2016-02-29 NOTE — Progress Notes (Signed)
Low-risk OB appointment G3P1011 6936w3d Estimated Date of Delivery: 07/22/16 BP 98/60   Pulse 72   Wt 169 lb (76.7 kg)   LMP 08/19/2015 (Approximate)   BMI 29.01 kg/m   BP, weight, and urine reviewed.  Refer to obstetrical flow sheet for FH & FHR.  Reports good fm.  Denies regular uc's, lof, vb, or uti s/s. Vaginal d/c, states she's had it checked twice, always normal- no itching/irritation, she feels like there's an odor- but not a bad odor. Offered to check it again- declines. Discussed can have more d/c during pregnancy.  Reviewed today's normal anatomy u/s, warning s/s to report, fm. Plan:  Continue routine obstetrical care  F/U in 4wks for OB appointment  Declines flu shot

## 2016-02-29 NOTE — Patient Instructions (Signed)

## 2016-03-21 ENCOUNTER — Telehealth: Payer: Self-pay | Admitting: Obstetrics & Gynecology

## 2016-03-21 NOTE — Telephone Encounter (Signed)
Pt called stating that she went to the ER and they gave her a medication to take. Pt would like to know if the medication they gave her is ok to take while being preg. Please contact pt

## 2016-03-21 NOTE — Telephone Encounter (Signed)
Pt states [facility], dx with Bacterial Vaginosis and treated with Flagyl 500 mg. Is it safe to take Flagyl at [redacted] weeks gestation. Pt informed per Genelle GatherJennifer Griffin,NP is safe to take Flagyl 500 mg. Pt has f/u appt with Joellyn HaffKim Booker, CNM on 03/28/2016.

## 2016-03-28 ENCOUNTER — Encounter: Payer: Self-pay | Admitting: Women's Health

## 2016-03-28 ENCOUNTER — Ambulatory Visit (INDEPENDENT_AMBULATORY_CARE_PROVIDER_SITE_OTHER): Payer: Medicaid Other | Admitting: Women's Health

## 2016-03-28 VITALS — BP 90/48 | HR 81 | Wt 178.0 lb

## 2016-03-28 DIAGNOSIS — K429 Umbilical hernia without obstruction or gangrene: Secondary | ICD-10-CM | POA: Insufficient documentation

## 2016-03-28 DIAGNOSIS — Z3482 Encounter for supervision of other normal pregnancy, second trimester: Secondary | ICD-10-CM

## 2016-03-28 DIAGNOSIS — Z331 Pregnant state, incidental: Secondary | ICD-10-CM

## 2016-03-28 DIAGNOSIS — Z3A24 24 weeks gestation of pregnancy: Secondary | ICD-10-CM

## 2016-03-28 DIAGNOSIS — O9989 Other specified diseases and conditions complicating pregnancy, childbirth and the puerperium: Secondary | ICD-10-CM

## 2016-03-28 DIAGNOSIS — Z1389 Encounter for screening for other disorder: Secondary | ICD-10-CM

## 2016-03-28 DIAGNOSIS — O23592 Infection of other part of genital tract in pregnancy, second trimester: Secondary | ICD-10-CM

## 2016-03-28 LAB — POCT URINALYSIS DIPSTICK
Blood, UA: NEGATIVE
GLUCOSE UA: NEGATIVE
KETONES UA: NEGATIVE
Nitrite, UA: NEGATIVE
Protein, UA: NEGATIVE

## 2016-03-28 NOTE — Progress Notes (Signed)
Low-risk OB appointment G3P1011 2665w3d Estimated Date of Delivery: 07/22/16 BP (!) 90/48   Pulse 81   Wt 178 lb (80.7 kg)   LMP 08/19/2015 (Approximate)   BMI 30.55 kg/m   BP, weight, and urine reviewed.  Refer to obstetrical flow sheet for FH & FHR.  Reports good fm.  Denies regular uc's, lof, vb, or uti s/s. Just finished metronidazole this am for bv, now thinks she has yeast infection- thick clumpy d/c w/ some itching Spec exam: copious amt thick clumpy d/c adherent to vag walls, no wet prep needed To ust otc monistat 7 for yeast, no sex while using Reviewed ptl s/s, fm. Plan:  Continue routine obstetrical care  F/U in 4wks for OB appointment and pn2

## 2016-03-28 NOTE — Patient Instructions (Addendum)
Over the counter 7 night yeast cream (like Monistat 7), no sex while using  You will have your sugar test next visit.  Please do not eat or drink anything after midnight the night before you come, not even water.  You will be here for at least two hours.     Call the office [phone]) or go to [facility] if:  You begin to have strong, frequent contractions  Your water breaks.  Sometimes it is a big gush of fluid, sometimes it is just a trickle that keeps getting your panties wet or running down your legs  You have vaginal bleeding.  It is normal to have a small amount of spotting if your cervix was checked.   You don't feel your baby moving like normal.  If you don't, get you something to eat and drink and lay down and focus on feeling your baby move.   If your baby is still not moving like normal, you should call the office or [facility] to [facility].  Second Trimester of Pregnancy The second trimester is from week 13 through week 28, months 4 through 6. The second trimester is often a time when you feel your best. Your body has also adjusted to being pregnant, and you begin to feel better physically. Usually, morning sickness has lessened or quit completely, you may have more energy, and you may have an increase in appetite. The second trimester is also a time when the fetus is growing rapidly. At the end of the sixth month, the fetus is about 9 inches long and weighs about 1 pounds. You will likely begin to feel the baby move (quickening) between 18 and 20 weeks of the pregnancy. BODY CHANGES Your body goes through many changes during pregnancy. The changes vary from woman to woman.   Your weight will continue to increase. You will notice your lower abdomen bulging out.  You may begin to get stretch marks on your hips, abdomen, and breasts.  You may develop headaches that can be relieved by medicines approved by your health care provider.  You may urinate more often because the  fetus is pressing on your bladder.  You may develop or continue to have heartburn as a result of your pregnancy.  You may develop constipation because certain hormones are causing the muscles that push waste through your intestines to slow down.  You may develop hemorrhoids or swollen, bulging veins (varicose veins).  You may have back pain because of the weight gain and pregnancy hormones relaxing your joints between the bones in your pelvis and as a result of a shift in weight and the muscles that support your balance.  Your breasts will continue to grow and be tender.  Your gums may bleed and may be sensitive to brushing and flossing.  Dark spots or blotches (chloasma, mask of pregnancy) may develop on your face. This will likely fade after the baby is born.  A dark line from your belly button to the pubic area (linea nigra) may appear. This will likely fade after the baby is born.  You may have changes in your hair. These can include thickening of your hair, rapid growth, and changes in texture. Some women also have hair loss during or after pregnancy, or hair that feels dry or thin. Your hair will most likely return to normal after your baby is born. WHAT TO EXPECT AT YOUR PRENATAL VISITS During a routine prenatal visit:  You will be weighed to make sure  you and the fetus are growing normally.  Your blood pressure will be taken.  Your abdomen will be measured to track your baby's growth.  The fetal heartbeat will be listened to.  Any test results from the previous visit will be discussed. Your health care provider may ask you:  How you are feeling.  If you are feeling the baby move.  If you have had any abnormal symptoms, such as leaking fluid, bleeding, severe headaches, or abdominal cramping.  If you have any questions. Other tests that may be performed during your second trimester include:  Blood tests that check for:  Low iron levels (anemia).  Gestational  diabetes (between 24 and 28 weeks).  Rh antibodies.  Urine tests to check for infections, diabetes, or protein in the urine.  An ultrasound to confirm the proper growth and development of the baby.  An amniocentesis to check for possible genetic problems.  Fetal screens for spina bifida and Down syndrome. HOME CARE INSTRUCTIONS   Avoid all smoking, herbs, alcohol, and unprescribed drugs. These chemicals affect the formation and growth of the baby.  Follow your health care provider's instructions regarding medicine use. There are medicines that are either safe or unsafe to take during pregnancy.  Exercise only as directed by your health care provider. Experiencing uterine cramps is a good sign to stop exercising.  Continue to eat regular, healthy meals.  Wear a good support bra for breast tenderness.  Do not use hot tubs, steam rooms, or saunas.  Wear your seat belt at all times when driving.  Avoid raw meat, uncooked cheese, cat litter boxes, and soil used by cats. These carry germs that can cause birth defects in the baby.  Take your prenatal vitamins.  Try taking a stool softener (if your health care provider approves) if you develop constipation. Eat more high-fiber foods, such as fresh vegetables or fruit and whole grains. Drink plenty of fluids to keep your urine clear or pale yellow.  Take warm sitz baths to soothe any pain or discomfort caused by hemorrhoids. Use hemorrhoid cream if your health care provider approves.  If you develop varicose veins, wear support hose. Elevate your feet for 15 minutes, 3-4 times a day. Limit salt in your diet.  Avoid heavy lifting, wear low heel shoes, and practice good posture.  Rest with your legs elevated if you have leg cramps or low back pain.  Visit your dentist if you have not gone yet during your pregnancy. Use a soft toothbrush to brush your teeth and be gentle when you floss.  A sexual relationship may be continued unless  your health care provider directs you otherwise.  Continue to go to all your prenatal visits as directed by your health care provider. SEEK MEDICAL CARE IF:   You have dizziness.  You have mild pelvic cramps, pelvic pressure, or nagging pain in the abdominal area.  You have persistent nausea, vomiting, or diarrhea.  You have a bad smelling vaginal discharge.  You have pain with urination. SEEK IMMEDIATE MEDICAL CARE IF:   You have a fever.  You are leaking fluid from your vagina.  You have spotting or bleeding from your vagina.  You have severe abdominal cramping or pain.  You have rapid weight gain or loss.  You have shortness of breath with chest pain.  You notice sudden or extreme swelling of your face, hands, ankles, feet, or legs.  You have not felt your baby move in over an hour.  You  have severe headaches that do not go away with medicine.  You have vision changes. Document Released: 05/17/2001 Document Revised: 05/28/2013 Document Reviewed: 07/24/2012 [facility] Patient Information 2015 [employer] [place]. This information is not intended to replace advice given to you by your health care provider. Make sure you discuss any questions you have with your health care provider.

## 2016-04-02 ENCOUNTER — Inpatient Hospital Stay (HOSPITAL_COMMUNITY)
Admission: AD | Admit: 2016-04-02 | Discharge: 2016-04-02 | Disposition: A | Discharge status: Home / Self Care | Payer: Medicaid Other | Source: Ambulatory Visit | Attending: Obstetrics and Gynecology | Admitting: Obstetrics and Gynecology

## 2016-04-02 ENCOUNTER — Encounter (HOSPITAL_COMMUNITY): Payer: Self-pay | Admitting: *Deleted

## 2016-04-02 DIAGNOSIS — O368120 Decreased fetal movements, second trimester, not applicable or unspecified: Secondary | ICD-10-CM | POA: Insufficient documentation

## 2016-04-02 DIAGNOSIS — Z3A24 24 weeks gestation of pregnancy: Secondary | ICD-10-CM | POA: Diagnosis not present

## 2016-04-02 NOTE — MAU Note (Signed)
Pt reports she had not felt baby move since 8pm last night. Denies any pain or cramping.

## 2016-04-02 NOTE — MAU Provider Note (Signed)
History    G3P1101 @ 24.1 wks in with decreased fetal movement. Has not felt baby move since last night. CSN: [id number]  Arrival date & time 04/02/16  1520   None     Chief Complaint  Patient presents with  . Decreased Fetal Movement    HPI  Past Medical History:  Diagnosis Date  . Herpes    genital  . History of cesarean section 12/16/2015  . Mental disorder    postpartum depression  . Migraines   . Supervision of normal pregnancy 12/16/2015    [facility] at   8+5 weeks FOB  Corwin Levinswilliam smith 25 yo bm 11th child Dating By  US Pap  12/16/15 GC/CT Initial:                36+wks: Genetic Screen NT/IT:  CF screen  Anatomic US  Flu vaccine  Tdap Recommended ~ 28wks Glucose Screen  2 hr GBS  Feed Preference  Contraception  Circumcision  Childbirth Classes  Pediatrician    . Vaginal Pap smear, abnormal     Past Surgical History:  Procedure Laterality Date  . CESAREAN SECTION      Family History  Problem Relation Age of Onset  . Heart murmur Mother   . Hypertension Mother   . Diabetes Mother   . Thyroid disease Mother   . Heart murmur Sister   . Cancer Maternal Grandmother     breast   . Hypertension Maternal Grandmother   . Heart murmur Sister   . Hypertension Father   . Hypertension Maternal Grandfather   . Thyroid disease Maternal Aunt   . Hypertension Maternal Aunt   . Diabetes Maternal Aunt   . Diabetes Maternal Uncle   . Hypertension Maternal Uncle   . Diabetes Paternal Aunt   . Hypertension Paternal Aunt   . Hypertension Paternal Uncle   . Diabetes Paternal Uncle   . Seizures Paternal Grandmother   . Stroke Paternal Grandmother   . Hypertension Paternal Grandmother   . Diabetes Paternal Grandfather   . Hypertension Paternal Grandfather     Social History  Substance Use Topics  . Smoking status: Never Smoker  . Smokeless tobacco: Never Used  . Alcohol use No    OB History    Gravida Para Term Preterm AB Living   3 1 1   1 1    SAB TAB Ectopic Multiple Live Births   1       1      Review of Systems  Constitutional: Negative.   HENT: Negative.   Eyes: Negative.   Respiratory: Negative.   Cardiovascular: Negative.   Gastrointestinal: Negative.   Endocrine: Negative.   Genitourinary: Negative.   Musculoskeletal: Negative.   Skin: Negative.   Allergic/Immunologic: Negative.   Neurological: Negative.   Hematological: Negative.   Psychiatric/Behavioral: Negative.     Allergies  Review of patient's allergies indicates no known allergies.  Home Medications    BP 105/63   Pulse 89   Temp 97.7 F (36.5 C)   Resp 18   LMP 08/19/2015 (Approximate)   Physical Exam  Constitutional: She is oriented to person, place, and time. She appears well-developed and well-nourished.  HENT:  Head: Normocephalic.  Eyes: Pupils are equal, round, and reactive to light.  Neck: Normal range of motion.  Cardiovascular: Normal rate, regular rhythm, normal heart sounds and intact distal pulses.   Pulmonary/Chest: Effort normal and breath sounds normal.  Abdominal: Soft. Bowel sounds are normal.  Genitourinary: Vagina normal and uterus normal.  Musculoskeletal: Normal range of motion.  Neurological: She is alert and oriented to person, place, and time. She has normal reflexes.  Skin: Skin is warm and dry.  Psychiatric: She has a normal mood and affect. Her behavior is normal. Judgment and thought content normal.    MAU Course  Procedures (including critical care time)  Labs Reviewed - No data to display No results found.   No diagnosis found.    MDM  Decreased fetal movement FHR pattern reassuring, good movement noted on evaulation. [facility]

## 2016-04-02 NOTE — Discharge Instructions (Signed)
Fetal Movement Counts  Patient Name: __________________________________________________ Patient Due Date: ____________________  Performing a fetal movement count is highly recommended in high-risk pregnancies, but it is good for every pregnant woman to do. Your health care provider may ask you to start counting fetal movements at 28 weeks of the pregnancy. Fetal movements often increase:  · After eating a full meal.  · After physical activity.  · After eating or drinking something sweet or cold.  · At rest.  Pay attention to when you feel the baby is most active. This will help you notice a pattern of your baby's sleep and wake cycles and what factors contribute to an increase in fetal movement. It is important to perform a fetal movement count at the same time each day when your baby is normally most active.   HOW TO COUNT FETAL MOVEMENTS  1. Find a quiet and comfortable area to sit or lie down on your left side. Lying on your left side provides the best blood and oxygen circulation to your baby.  2. Write down the day and time on a sheet of paper or in a journal.  3. Start counting kicks, flutters, swishes, rolls, or jabs in a 2-hour period. You should feel at least 10 movements within 2 hours.  4. If you do not feel 10 movements in 2 hours, wait 2-3 hours and count again. Look for a change in the pattern or not enough counts in 2 hours.  SEEK MEDICAL CARE IF:  · You feel less than 10 counts in 2 hours, tried twice.  · There is no movement in over an hour.  · The pattern is changing or taking longer each day to reach 10 counts in 2 hours.  · You feel the baby is not moving as he or she usually does.  Date: ____________ Movements: ____________ Start time: ____________ Finish time: ____________   Date: ____________ Movements: ____________ Start time: ____________ Finish time: ____________  Date: ____________ Movements: ____________ Start time: ____________ Finish time: ____________  Date: ____________ Movements:  ____________ Start time: ____________ Finish time: ____________  Date: ____________ Movements: ____________ Start time: ____________ Finish time: ____________  Date: ____________ Movements: ____________ Start time: ____________ Finish time: ____________  Date: ____________ Movements: ____________ Start time: ____________ Finish time: ____________  Date: ____________ Movements: ____________ Start time: ____________ Finish time: ____________   Date: ____________ Movements: ____________ Start time: ____________ Finish time: ____________  Date: ____________ Movements: ____________ Start time: ____________ Finish time: ____________  Date: ____________ Movements: ____________ Start time: ____________ Finish time: ____________  Date: ____________ Movements: ____________ Start time: ____________ Finish time: ____________  Date: ____________ Movements: ____________ Start time: ____________ Finish time: ____________  Date: ____________ Movements: ____________ Start time: ____________ Finish time: ____________  Date: ____________ Movements: ____________ Start time: ____________ Finish time: ____________   Date: ____________ Movements: ____________ Start time: ____________ Finish time: ____________  Date: ____________ Movements: ____________ Start time: ____________ Finish time: ____________  Date: ____________ Movements: ____________ Start time: ____________ Finish time: ____________  Date: ____________ Movements: ____________ Start time: ____________ Finish time: ____________  Date: ____________ Movements: ____________ Start time: ____________ Finish time: ____________  Date: ____________ Movements: ____________ Start time: ____________ Finish time: ____________  Date: ____________ Movements: ____________ Start time: ____________ Finish time: ____________   Date: ____________ Movements: ____________ Start time: ____________ Finish time: ____________  Date: ____________ Movements: ____________ Start time: ____________ Finish  time: ____________  Date: ____________ Movements: ____________ Start time: ____________ Finish time: ____________  Date: ____________ Movements: ____________ Start time:   ____________ Finish time: ____________  Date: ____________ Movements: ____________ Start time: ____________ Finish time: ____________  Date: ____________ Movements: ____________ Start time: ____________ Finish time: ____________  Date: ____________ Movements: ____________ Start time: ____________ Finish time: ____________   Date: ____________ Movements: ____________ Start time: ____________ Finish time: ____________  Date: ____________ Movements: ____________ Start time: ____________ Finish time: ____________  Date: ____________ Movements: ____________ Start time: ____________ Finish time: ____________  Date: ____________ Movements: ____________ Start time: ____________ Finish time: ____________  Date: ____________ Movements: ____________ Start time: ____________ Finish time: ____________  Date: ____________ Movements: ____________ Start time: ____________ Finish time: ____________  Date: ____________ Movements: ____________ Start time: ____________ Finish time: ____________   Date: ____________ Movements: ____________ Start time: ____________ Finish time: ____________  Date: ____________ Movements: ____________ Start time: ____________ Finish time: ____________  Date: ____________ Movements: ____________ Start time: ____________ Finish time: ____________  Date: ____________ Movements: ____________ Start time: ____________ Finish time: ____________  Date: ____________ Movements: ____________ Start time: ____________ Finish time: ____________  Date: ____________ Movements: ____________ Start time: ____________ Finish time: ____________  Date: ____________ Movements: ____________ Start time: ____________ Finish time: ____________   Date: ____________ Movements: ____________ Start time: ____________ Finish time: ____________  Date: ____________  Movements: ____________ Start time: ____________ Finish time: ____________  Date: ____________ Movements: ____________ Start time: ____________ Finish time: ____________  Date: ____________ Movements: ____________ Start time: ____________ Finish time: ____________  Date: ____________ Movements: ____________ Start time: ____________ Finish time: ____________  Date: ____________ Movements: ____________ Start time: ____________ Finish time: ____________  Date: ____________ Movements: ____________ Start time: ____________ Finish time: ____________   Date: ____________ Movements: ____________ Start time: ____________ Finish time: ____________  Date: ____________ Movements: ____________ Start time: ____________ Finish time: ____________  Date: ____________ Movements: ____________ Start time: ____________ Finish time: ____________  Date: ____________ Movements: ____________ Start time: ____________ Finish time: ____________  Date: ____________ Movements: ____________ Start time: ____________ Finish time: ____________  Date: ____________ Movements: ____________ Start time: ____________ Finish time: ____________     This information is not intended to replace advice given to you by your health care provider. Make sure you discuss any questions you have with your health care provider.     Document Released: 06/22/2006 Document Revised: 06/13/2014 Document Reviewed: 03/19/2012  Elsevier Interactive Patient Education ©2016 [employer]

## 2016-04-08 ENCOUNTER — Telehealth: Payer: Self-pay | Admitting: *Deleted

## 2016-04-08 NOTE — Telephone Encounter (Signed)
Pt states she has a yeast infection, she took Monistat 7 OTC with no relief, her last dose was Tuesday 11/31 and then last night she noticed a rash on her upper thighs and around her groin area.  Pt states she is still having the d/c and irritation as well as having the new rash and it is painful to walk.  Advised pt she will need OV but we close at 2 pm today, will have to be worked in on Monday.  Advised pt she can go to the hospital to be evaluated if feels like can not wait until Monday.  Pt verbalized understanding and call transferred to front staff for appointment to be made.

## 2016-04-11 ENCOUNTER — Encounter: Payer: Self-pay | Admitting: Women's Health

## 2016-04-11 ENCOUNTER — Ambulatory Visit (INDEPENDENT_AMBULATORY_CARE_PROVIDER_SITE_OTHER): Payer: Medicaid Other | Admitting: Women's Health

## 2016-04-11 VITALS — BP 100/50 | HR 80 | Wt 179.0 lb

## 2016-04-11 DIAGNOSIS — Z3482 Encounter for supervision of other normal pregnancy, second trimester: Secondary | ICD-10-CM

## 2016-04-11 DIAGNOSIS — B9689 Other specified bacterial agents as the cause of diseases classified elsewhere: Secondary | ICD-10-CM | POA: Diagnosis not present

## 2016-04-11 DIAGNOSIS — N898 Other specified noninflammatory disorders of vagina: Secondary | ICD-10-CM

## 2016-04-11 DIAGNOSIS — Z331 Pregnant state, incidental: Secondary | ICD-10-CM

## 2016-04-11 DIAGNOSIS — F32A Depression, unspecified: Secondary | ICD-10-CM

## 2016-04-11 DIAGNOSIS — N760 Acute vaginitis: Secondary | ICD-10-CM

## 2016-04-11 DIAGNOSIS — F329 Major depressive disorder, single episode, unspecified: Secondary | ICD-10-CM

## 2016-04-11 DIAGNOSIS — F418 Other specified anxiety disorders: Secondary | ICD-10-CM | POA: Insufficient documentation

## 2016-04-11 DIAGNOSIS — Z3A26 26 weeks gestation of pregnancy: Secondary | ICD-10-CM

## 2016-04-11 DIAGNOSIS — O26892 Other specified pregnancy related conditions, second trimester: Secondary | ICD-10-CM

## 2016-04-11 DIAGNOSIS — O99342 Other mental disorders complicating pregnancy, second trimester: Secondary | ICD-10-CM

## 2016-04-11 DIAGNOSIS — Z1389 Encounter for screening for other disorder: Secondary | ICD-10-CM

## 2016-04-11 LAB — POCT URINALYSIS DIPSTICK
Blood, UA: NEGATIVE
Glucose, UA: NEGATIVE
Ketones, UA: NEGATIVE
LEUKOCYTES UA: NEGATIVE
NITRITE UA: NEGATIVE
PROTEIN UA: NEGATIVE

## 2016-04-11 LAB — POCT WET PREP (WET MOUNT)
Clue Cells Wet Prep Whiff POC: POSITIVE
Trichomonas Wet Prep HPF POC: ABSENT

## 2016-04-11 MED ORDER — ESCITALOPRAM OXALATE 10 MG PO TABS: 10.0000 mg | ORAL_TABLET | Freq: Every day | ORAL | 6 refills | Status: DC

## 2016-04-11 MED ORDER — METRONIDAZOLE 500 MG PO TABS: 500.0000 mg | ORAL_TABLET | Freq: Two times a day (BID) | ORAL | 0 refills | Status: DC

## 2016-04-11 NOTE — Progress Notes (Signed)
Work-in  Low-risk OB appointment [id number] Estimated Date of Delivery: 07/22/16 BP (!) 100/50   Pulse 80   Wt 179 lb (81.2 kg)   LMP 08/19/2015 (Approximate)   BMI 30.73 kg/m   BP, weight, and urine reviewed.  Refer to obstetrical flow sheet for FH & FHR.  Reports good fm.  Denies regular uc's, lof, vb, or uti s/s. D/c w/ odor and itching. Just finished Monistat 7 for yeast. Feels depressed, felt this way prior to pregnancy but was too embarrassed to discuss. Doesn't sleep well, no appetite, doesn't find joy in things she used to, doesn't feel like doing anything, does have occ thoughts that her son may be better off w/o her but does not have plan, has never thought she would act on thoughts. 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  Pelvic exam: no evidence of yeast, +odor, thin white d/c, wet prep many clues, rx metronidazole bid x 7d for BV- try wearing condom to help prevent. Doesn't douche, only washes pelvic area w/ warm water- no soap. Go ahead and start eating yogurt daily to try to help prevent yeast infection. \ Depression screen PHQ 2/9 04/11/2016  Decreased Interest 3  Down, Depressed, Hopeless 3  PHQ - 2 Score 6  Altered sleeping 2  Tired, decreased energy 2  Change in appetite 3  Feeling bad or failure about yourself  3  Trouble concentrating 2  Moving slowly or fidgety/restless 2  Suicidal thoughts 2  PHQ-9 Score 22    Results for orders placed or performed in visit on 04/11/16 (from the past 24 hour(s))  POCT urinalysis dipstick     Status: None   Collection Time: 04/11/16  4:03 PM  Result Value Ref Range   Color, UA     Clarity, UA     Glucose, UA neg    Bilirubin, UA     Ketones, UA neg    Spec Grav, UA     Blood, UA neg    pH, UA     Protein, UA neg    Urobilinogen, UA     Nitrite, UA neg    Leukocytes, UA Negative Negative  POCT Wet Prep Mellody Drown([place]et [place])     Status: Abnormal   Collection Time:  04/11/16  5:06 PM  Result Value Ref Range   Source Wet Prep POC vaginal    WBC, Wet Prep HPF POC none    Bacteria Wet Prep HPF POC None None, Few, Too numerous to count   BACTERIA WET PREP MORPHOLOGY POC     Clue Cells Wet Prep HPF POC Many (A) None, Too numerous to count   Clue Cells Wet Prep Whiff POC Positive Whiff    Yeast Wet Prep HPF POC None    KOH Wet Prep POC     Trichomonas Wet Prep HPF POC Absent Absent    Discussed depression, recommended antidepressant and counseling- agrees to both. Rx Lexapro 10mg  daily and [facility]. Understands can take a few weeks to notice improvement w/ medicine. If develops plan or feels she's worsening to call us or go to ED Plan:  Continue routine obstetrical care  F/U as scheduled on 11/20 for OB appointment and pn2

## 2016-04-11 NOTE — Patient Instructions (Addendum)
Bacterial Vaginosis Bacterial vaginosis is a vaginal infection that occurs when the normal balance of bacteria in the vagina is disrupted. It results from an overgrowth of certain bacteria. This is the most common vaginal infection in women of childbearing age. Treatment is important to prevent complications, especially in pregnant women, as it can cause a premature delivery. CAUSES  Bacterial vaginosis is caused by an increase in harmful bacteria that are normally present in smaller amounts in the vagina. Several different kinds of bacteria can cause bacterial vaginosis. However, the reason that the condition develops is not fully understood. RISK FACTORS Certain activities or behaviors can put you at an increased risk of developing bacterial vaginosis, including:  Having a new sex partner or multiple sex partners.  Douching.  Using an intrauterine device (IUD) for contraception. Women do not get bacterial vaginosis from toilet seats, bedding, swimming pools, or contact with objects around them. SIGNS AND SYMPTOMS  Some women with bacterial vaginosis have no signs or symptoms. Common symptoms include:  Grey vaginal discharge.  A fishlike odor with discharge, especially after sexual intercourse.  Itching or burning of the vagina and vulva.  Burning or pain with urination. DIAGNOSIS  Your health care provider will take a medical history and examine the vagina for signs of bacterial vaginosis. A sample of vaginal fluid may be taken. Your health care provider will look at this sample under a microscope to check for bacteria and abnormal cells. A vaginal pH test may also be done.  TREATMENT  Bacterial vaginosis may be treated with antibiotic medicines. These may be given in the form of a pill or a vaginal cream. A second round of antibiotics may be prescribed if the condition comes back after treatment. Because bacterial vaginosis increases your risk for sexually transmitted diseases, getting  treated can help reduce your risk for chlamydia, gonorrhea, HIV, and herpes. HOME CARE INSTRUCTIONS   Only take over-the-counter or prescription medicines as directed by your health care provider.  If antibiotic medicine was prescribed, take it as directed. Make sure you finish it even if you start to feel better.  Tell all sexual partners that you have a vaginal infection. They should see their health care provider and be treated if they have problems, such as a mild rash or itching.  During treatment, it is important that you follow these instructions:  Avoid sexual activity or use condoms correctly.  Do not douche.  Avoid alcohol as directed by your health care provider.  Avoid breastfeeding as directed by your health care provider. SEEK MEDICAL CARE IF:   Your symptoms are not improving after 3 days of treatment.  You have increased discharge or pain.  You have a fever. MAKE SURE YOU:   Understand these instructions.  Will watch your condition.  Will get help right away if you are not doing well or get worse. FOR MORE INFORMATION  Centers for Disease Control and Prevention, Division of STD Prevention: SolutionApps.co.za American Sexual Health Association (ASHA): www.ashastd.org    This information is not intended to replace advice given to you by your health care provider. Make sure you discuss any questions you have with your health care provider.   Document Released: 05/23/2005 Document Revised: 06/13/2014 Document Reviewed: 01/02/2013 Elsevier Interactive Patient Education 2016 [employer].  Suicidal Feelings: How to Help Yourself Suicide is the taking of one's own life. If you feel as though life is getting too tough to handle and are thinking about suicide, get help right away. To  get help:  Call your local emergency services (911 in the [place]  Call a suicide hotline to speak with a trained counselor who understands how you are feeling. The following is a list  of suicide hotlines in the [place]. For a list of hotlines in [place], visit InkDistributor.it.  1-800-273-TALK 4430038011).  1-800-SUICIDE 430-128-5114).  208-805-2066. This is a hotline for Spanish speakers.  4-696-295-2WUX 5738720311). This is a hotline for TTY users.  1-866-4-U-TREVOR 458 611 7610). This is a hotline for lesbian, gay, bisexual, transgender, or questioning youth.  Contact a crisis center or a local suicide prevention center. To find a crisis center or suicide prevention center:  Call your local hospital, clinic, community service organization, mental health center, social service provider, or health department. Ask for assistance in connecting to a crisis center.  Visit https://www.patel-king.com/ for a list of crisis centers in the [place], or visit www.suicideprevention.ca/thinking-about-suicide/find-a-crisis-centre for a list of centers in [place].  Visit the following websites:  National Suicide Prevention Lifeline: www.suicidepreventionlifeline.org  Hopeline: www.hopeline.com  [employer] for Suicide Prevention: https://www.ayers.com/  [employer] (for lesbian, gay, bisexual, transgender, or questioning youth): www.thetrevorproject.org HOW CAN I HELP MYSELF FEEL BETTER?  Promise yourself that you will not do anything drastic when you have suicidal feelings. Remember, there is hope. Many people have gotten through suicidal thoughts and feelings, and you will, too. You may have gotten through them before, and this proves that you can get through them again.  Let family, friends, teachers, or counselors know how you are feeling. Try not to isolate yourself from those who care about you. Remember, they will want to help you. Talk with someone every day, even if you do not feel sociable. Face-to-face conversation is best.  Call a mental health  professional and see one regularly.  Visit your primary health care provider every year.  Eat a well-balanced diet, and space your meals so you eat regularly.  Get plenty of rest.  Avoid alcohol and drugs, and remove them from your home. They will only make you feel worse.  If you are thinking of taking a lot of medicine, give your medicine to someone who can give it to you one day at a time. If you are on antidepressants and are concerned you will overdose, let your health care provider know so he or she can give you safer medicines. Ask your mental health professional about the possible side effects of any medicines you are taking.  Remove weapons, poisons, knives, and anything else that could harm you from your home.  Try to stick to routines. Follow a schedule every day. Put self-care on your schedule.  Make a list of realistic goals, and cross them off when you achieve them. Accomplishments give a sense of worth.  Wait until you are feeling better before doing the things you find difficult or unpleasant.  Exercise if you are able. You will feel better if you exercise for even a half hour each day.  Go out in the sun or into nature. This will help you recover from depression faster. If you have a favorite place to walk, go there.  Do the things that have always given you pleasure. Play your favorite music, read a good book, paint a picture, play your favorite instrument, or do anything else that takes your mind off your depression if it is safe to do.  Keep your living space well lit.  When you are feeling well, write yourself a letter about tips and support  that you can read when you are not feeling well.  Remember that life's difficulties can be sorted out with help. Conditions can be treated. You can work on thoughts and strategies that serve you well.   This information is not intended to replace advice given to you by your health care provider. Make sure you discuss any  questions you have with your health care provider.   Document Released: 11/27/2002 Document Revised: 06/13/2014 Document Reviewed: 09/17/2013 Elsevier Interactive Patient Education 2016 Elsevier Inc.  Major Depressive Disorder Major depressive disorder is a mental illness. It also may be called clinical depression or unipolar depression. Major depressive disorder usually causes feelings of sadness, hopelessness, or helplessness. Some people with this disorder do not feel particularly sad but lose interest in doing things they used to enjoy (anhedonia). Major depressive disorder also can cause physical symptoms. It can interfere with work, school, relationships, and other normal everyday activities. The disorder varies in severity but is longer lasting and more serious than the sadness we all feel from time to time in our lives. Major depressive disorder often is triggered by stressful life events or major life changes. Examples of these triggers include divorce, loss of your job or home, a move, and the death of a family member or close friend. Sometimes this disorder occurs for no obvious reason at all. People who have family members with major depressive disorder or bipolar disorder are at higher risk for developing this disorder, with or without life stressors. Major depressive disorder can occur at any age. It may occur just once in your life (single episode major depressive disorder). It may occur multiple times (recurrent major depressive disorder). SYMPTOMS People with major depressive disorder have either anhedonia or depressed mood on nearly a daily basis for at least 2 weeks or longer. Symptoms of depressed mood include:  Feelings of sadness (blue or down in the dumps) or emptiness.  Feelings of hopelessness or helplessness.  Tearfulness or episodes of crying (may be observed by others).  Irritability (children and adolescents). In addition to depressed mood or anhedonia or both, people  with this disorder have at least four of the following symptoms:  Difficulty sleeping or sleeping too much.   Significant change (increase or decrease) in appetite or weight.   Lack of energy or motivation.  Feelings of guilt and worthlessness.   Difficulty concentrating, remembering, or making decisions.  Unusually slow movement (psychomotor retardation) or restlessness (as observed by others).   Recurrent wishes for death, recurrent thoughts of self-harm (suicide), or a suicide attempt. People with major depressive disorder commonly have persistent negative thoughts about themselves, other people, and the world. People with severe major depressive disorder may experiencedistorted beliefs or perceptions about the world (psychotic delusions). They also may see or hear things that are not real (psychotic hallucinations). DIAGNOSIS Major depressive disorder is diagnosed through an assessment by your health care provider. Your health care provider will ask aboutaspects of your daily life, such as mood,sleep, and appetite, to see if you have the diagnostic symptoms of major depressive disorder. Your health care provider may ask about your medical history and use of alcohol or drugs, including prescription medicines. Your health care provider also may do a physical exam and blood work. This is because certain medical conditions and the use of certain substances can cause major depressive disorder-like symptoms (secondary depression). Your health care provider also may refer you to a mental health specialist for further evaluation and treatment. TREATMENT It is important  to recognize the symptoms of major depressive disorder and seek treatment. The following treatments can be prescribed for this disorder:   Medicine. Antidepressant medicines usually are prescribed. Antidepressant medicines are thought to correct chemical imbalances in the brain that are commonly associated with major  depressive disorder. Other types of medicine may be added if the symptoms do not respond to antidepressant medicines alone or if psychotic delusions or hallucinations occur.  Talk therapy. Talk therapy can be helpful in treating major depressive disorder by providing support, education, and guidance. Certain types of talk therapy also can help with negative thinking (cognitive behavioral therapy) and with relationship issues that trigger this disorder (interpersonal therapy). A mental health specialist can help determine which treatment is best for you. Most people with major depressive disorder do well with a combination of medicine and talk therapy. Treatments involving electrical stimulation of the brain can be used in situations with extremely severe symptoms or when medicine and talk therapy do not work over time. These treatments include electroconvulsive therapy, transcranial magnetic stimulation, and vagal nerve stimulation.   This information is not intended to replace advice given to you by your health care provider. Make sure you discuss any questions you have with your health care provider.   Document Released: 09/17/2012 Document Revised: 06/13/2014 Document Reviewed: 09/17/2012 Elsevier Interactive Patient Education [employer]2016 Elsevier Inc.

## 2016-04-25 ENCOUNTER — Ambulatory Visit (INDEPENDENT_AMBULATORY_CARE_PROVIDER_SITE_OTHER): Payer: Medicaid Other | Admitting: Obstetrics & Gynecology

## 2016-04-25 ENCOUNTER — Encounter: Payer: Self-pay | Admitting: Obstetrics & Gynecology

## 2016-04-25 ENCOUNTER — Other Ambulatory Visit: Payer: Medicaid Other

## 2016-04-25 VITALS — BP 118/70 | HR 84 | Wt 182.0 lb

## 2016-04-25 DIAGNOSIS — Z3A27 27 weeks gestation of pregnancy: Secondary | ICD-10-CM

## 2016-04-25 DIAGNOSIS — Z131 Encounter for screening for diabetes mellitus: Secondary | ICD-10-CM

## 2016-04-25 DIAGNOSIS — Z331 Pregnant state, incidental: Secondary | ICD-10-CM

## 2016-04-25 DIAGNOSIS — Z1389 Encounter for screening for other disorder: Secondary | ICD-10-CM

## 2016-04-25 DIAGNOSIS — Z3482 Encounter for supervision of other normal pregnancy, second trimester: Secondary | ICD-10-CM

## 2016-04-25 DIAGNOSIS — Z3483 Encounter for supervision of other normal pregnancy, third trimester: Secondary | ICD-10-CM

## 2016-04-25 LAB — POCT URINALYSIS DIPSTICK
Blood, UA: NEGATIVE
Glucose, UA: NEGATIVE
Ketones, UA: NEGATIVE
Leukocytes, UA: NEGATIVE
Nitrite, UA: NEGATIVE
PROTEIN UA: NEGATIVE

## 2016-04-25 NOTE — Progress Notes (Signed)
[id number] Estimated Date of Delivery: 07/22/16  Blood pressure 118/70, pulse 84, weight 182 lb (82.6 kg), last menstrual period 08/19/2015, unknown if currently breastfeeding.   BP weight and urine results all reviewed and noted.  Please refer to the obstetrical flow sheet for the fundal height and fetal heart rate documentation:  Patient reports good fetal movement, denies any bleeding and no rupture of membranes symptoms or regular contractions. Patient is without complaints. All questions were answered.  Orders Placed This Encounter  Procedures  . POCT urinalysis dipstick    Plan:  Continued routine obstetrical care, PN2 today  No Follow-up on file.

## 2016-04-26 LAB — HIV ANTIBODY (ROUTINE TESTING W REFLEX): HIV Screen 4th Generation wRfx: NONREACTIVE

## 2016-04-26 LAB — GLUCOSE TOLERANCE, 2 HOURS W/ 1HR
GLUCOSE, 1 HOUR: 101 mg/dL (ref 65–179)
GLUCOSE, 2 HOUR: 73 mg/dL (ref 65–152)
Glucose, Fasting: 72 mg/dL (ref 65–91)

## 2016-04-26 LAB — CBC
HEMATOCRIT: 29.9 % — AB (ref 34.0–46.6)
HEMOGLOBIN: 10.2 g/dL — AB (ref 11.1–15.9)
MCH: 27.2 pg (ref 26.6–33.0)
MCHC: 34.1 g/dL (ref 31.5–35.7)
MCV: 80 fL (ref 79–97)
Platelets: 197 10*3/uL (ref 150–379)
RBC: 3.75 x10E6/uL — AB (ref 3.77–5.28)
RDW: 13.2 % (ref 12.3–15.4)
WBC: 6.6 10*3/uL (ref 3.4–10.8)

## 2016-04-26 LAB — RPR: RPR Ser Ql: NONREACTIVE

## 2016-04-26 LAB — ANTIBODY SCREEN: ANTIBODY SCREEN: NEGATIVE

## 2016-05-09 ENCOUNTER — Ambulatory Visit (INDEPENDENT_AMBULATORY_CARE_PROVIDER_SITE_OTHER): Payer: Medicaid Other | Admitting: Women's Health

## 2016-05-09 ENCOUNTER — Encounter: Payer: Self-pay | Admitting: Women's Health

## 2016-05-09 VITALS — BP 102/54 | HR 84 | Wt 187.0 lb

## 2016-05-09 DIAGNOSIS — Z3A30 30 weeks gestation of pregnancy: Secondary | ICD-10-CM

## 2016-05-09 DIAGNOSIS — Z3483 Encounter for supervision of other normal pregnancy, third trimester: Secondary | ICD-10-CM

## 2016-05-09 DIAGNOSIS — Z1389 Encounter for screening for other disorder: Secondary | ICD-10-CM

## 2016-05-09 DIAGNOSIS — B373 Candidiasis of vulva and vagina: Secondary | ICD-10-CM

## 2016-05-09 DIAGNOSIS — N898 Other specified noninflammatory disorders of vagina: Secondary | ICD-10-CM

## 2016-05-09 DIAGNOSIS — O26893 Other specified pregnancy related conditions, third trimester: Secondary | ICD-10-CM | POA: Diagnosis not present

## 2016-05-09 DIAGNOSIS — B3731 Acute candidiasis of vulva and vagina: Secondary | ICD-10-CM

## 2016-05-09 DIAGNOSIS — Z331 Pregnant state, incidental: Secondary | ICD-10-CM

## 2016-05-09 LAB — POCT WET PREP (WET MOUNT)
CLUE CELLS WET PREP WHIFF POC: NEGATIVE
TRICHOMONAS WET PREP HPF POC: ABSENT

## 2016-05-09 LAB — POCT URINALYSIS DIPSTICK
Blood, UA: NEGATIVE
GLUCOSE UA: NEGATIVE
Ketones, UA: NEGATIVE
Nitrite, UA: NEGATIVE
PROTEIN UA: NEGATIVE

## 2016-05-09 MED ORDER — TERCONAZOLE 0.4 % VA CREA: 1.0000 | TOPICAL_CREAM | Freq: Every day | VAGINAL | 0 refills | Status: DC

## 2016-05-09 NOTE — Patient Instructions (Addendum)
Eat yogurt everyday  Femdophilus vaginal probiotic (pill you take by mouth daily) Can buy on Amazon  Ferrous sulfate Iron tablets 363m twice daily, take with orange juice   Iron-Rich Diet Introduction Iron is a mineral that helps your body to produce hemoglobin. Hemoglobin is a protein in your red blood cells that carries oxygen to your body's tissues. Eating too little iron may cause you to feel weak and tired, and it can increase your risk for infection. Eating enough iron is necessary for your body's metabolism, muscle function, and nervous system. Iron is naturally found in many foods. It can also be added to foods or fortified in foods. There are two types of dietary iron:  Heme iron. Heme iron is absorbed by the body more easily than nonheme iron. Heme iron is found in meat, poultry, and fish.  Nonheme iron. Nonheme iron is found in dietary supplements, iron-fortified grains, beans, and vegetables. You may need to follow an iron-rich diet if:  You have been diagnosed with iron deficiency or iron-deficiency anemia.  You have a condition that prevents you from absorbing dietary iron, such as:  Infection in your intestines.  Celiac disease. This involves long-lasting (chronic) inflammation of your intestines.  You do not eat enough iron.  You eat a diet that is high in foods that impair iron absorption.  You have lost a lot of blood.  You have heavy bleeding during your menstrual cycle.  You are pregnant. What is my plan? Your health care provider may help you to determine how much iron you need per day based on your condition. Generally, when a person consumes sufficient amounts of iron in the diet, the following iron needs are met:  Men.  [age]years old: 11 mg per day.  [age]years old: 8 mg per day.  Women.  [age]years old: 15 mg per day.  [age]years old: 18 mg per day.  Over 517years old: 8 mg per day.  Pregnant women: 27 mg per day.  Breastfeeding  women: 9 mg per day. What do I need to know about an iron-rich diet?  Eat fresh fruits and vegetables that are high in vitamin C along with foods that are high in iron. This will help increase the amount of iron that your body absorbs from food, especially with foods containing nonheme iron. Foods that are high in vitamin C include oranges, peppers, tomatoes, and mango.  Take iron supplements only as directed by your health care provider. Overdose of iron can be life-threatening. If you were prescribed iron supplements, take them with orange juice or a vitamin C supplement.  Cook foods in pots and pans that are made from iron.  Eat nonheme iron-containing foods alongside foods that are high in heme iron. This helps to improve your iron absorption.  Certain foods and drinks contain compounds that impair iron absorption. Avoid eating these foods in the same meal as iron-rich foods or with iron supplements. These include:  Coffee, black tea, and red wine.  Milk, dairy products, and foods that are high in calcium.  Beans, soybeans, and peas.  Whole grains.  When eating foods that contain both nonheme iron and compounds that impair iron absorption, follow these tips to absorb iron better.  Soak beans overnight before cooking.  Soak whole grains overnight and drain them before using.  Ferment flours before baking, such as using yeast in bread dough. What foods can I eat? Grains  Iron-fortified breakfast cereal. Iron-fortified whole-wheat bread. Enriched  rice. Sprouted grains. Vegetables  Spinach. Potatoes with skin. Green peas. Broccoli. Red and green bell peppers. Fermented vegetables. Fruits  Prunes. Raisins. Oranges. Strawberries. Mango. Grapefruit. Meats and Other Protein Sources  Beef liver. Oysters. Beef. Shrimp. [place]. Chicken. [place]. Sardines. Chickpeas. Nuts. Tofu. Beverages  Tomato juice. Fresh orange juice. Prune juice. Hibiscus tea. Fortified instant breakfast  shakes. Condiments  Tahini. Fermented soy sauce. Sweets and Desserts  Black-strap molasses. Other  Wheat germ. The items listed above may not be a complete list of recommended foods or beverages. Contact your dietitian for more options.  What foods are not recommended? Grains  Whole grains. Bran cereal. Bran flour. Oats. Vegetables  Artichokes. Brussels sprouts. Kale. Fruits  Blueberries. Raspberries. Strawberries. Figs. Meats and Other Protein Sources  Soybeans. Products made from soy protein. Dairy  Milk. Cream. Cheese. Yogurt. Cottage cheese. Beverages  Coffee. Black tea. Red wine. Sweets and Desserts  Cocoa. Chocolate. Ice cream. Other  Basil. Oregano. Parsley. The items listed above may not be a complete list of foods and beverages to avoid. Contact your dietitian for more information.  This information is not intended to replace advice given to you by your health care provider. Make sure you discuss any questions you have with your health care provider. Document Released: 01/04/2005 Document Revised: 12/11/2015 Document Reviewed: 12/18/2013  2017 Elsevier

## 2016-05-09 NOTE — Progress Notes (Signed)
[facility] appointment [id number] Estimated Date of Delivery: 07/22/16 BP (!) 102/54   Pulse 84   Wt 187 lb (84.8 kg)   LMP 08/19/2015 (Approximate)   BMI 32.10 kg/m   BP, weight, and urine reviewed.  Refer to obstetrical flow sheet for FH & FHR.  Reports good fm.  Denies regular uc's, lof, vb, or uti s/s. D/C w/ itching x 1wk. Keeps yeast or BV all the time. Hasn't had sex since treated for BV last time.  Not taking Lexapro w/ consistency, hesitant b/c she's pregnant- discussed generally considered acceptable during pregnancy, risks of depression during pregnancy can have ill effects [facility] going well Spec exam: thick clumpy whitish-yellow d/c adherent to vag walls Wet prep many wbc's, yeast- rx terazole 7, send urine for gc/ct To take Femdophilus vaginal probiotic daily- can buy on amazon Reviewed pn2 results- slightly anemic, to take fe bid, increase fe-rich foods. Discussed ptl s/s, fkc Plan:  Continue routine obstetrical care  F/U as schedule for OB appointment

## 2016-05-11 LAB — GC/CHLAMYDIA PROBE AMP
Chlamydia trachomatis, NAA: NEGATIVE
Neisseria gonorrhoeae by PCR: NEGATIVE

## 2016-05-16 ENCOUNTER — Encounter: Payer: Self-pay | Admitting: Women's Health

## 2016-05-16 ENCOUNTER — Ambulatory Visit (INDEPENDENT_AMBULATORY_CARE_PROVIDER_SITE_OTHER): Payer: Medicaid Other | Admitting: Women's Health

## 2016-05-16 VITALS — BP 96/60 | HR 84 | Wt 187.0 lb

## 2016-05-16 DIAGNOSIS — Z3A31 31 weeks gestation of pregnancy: Secondary | ICD-10-CM

## 2016-05-16 DIAGNOSIS — Z331 Pregnant state, incidental: Secondary | ICD-10-CM

## 2016-05-16 DIAGNOSIS — Z1389 Encounter for screening for other disorder: Secondary | ICD-10-CM

## 2016-05-16 DIAGNOSIS — Z3483 Encounter for supervision of other normal pregnancy, third trimester: Secondary | ICD-10-CM

## 2016-05-16 LAB — POCT URINALYSIS DIPSTICK
GLUCOSE UA: NEGATIVE
KETONES UA: NEGATIVE
Leukocytes, UA: NEGATIVE
Nitrite, UA: NEGATIVE
Protein, UA: NEGATIVE
RBC UA: NEGATIVE

## 2016-05-16 NOTE — Patient Instructions (Addendum)
Call the office [phone]) or go to [facility] if:  You begin to have strong, frequent contractions  Your water breaks.  Sometimes it is a big gush of fluid, sometimes it is just a trickle that keeps getting your panties wet or running down your legs  You have vaginal bleeding.  It is normal to have a small amount of spotting if your cervix was checked.   You don't feel your baby moving like normal.  If you don't, get you something to eat and drink and lay down and focus on feeling your baby move.  You should feel at least 10 movements in 2 hours.  If you don't, you should call the office or go to [facility].    [place] Pediatricians/Family Doctors:  [facility] [phone]            [facility] [phone]                 [facility] [phone] (usually not accepting new patients unless you have family there already, you are always welcome to call and ask)            [facility] ([address] ) [phone]   [facility]:   [facility]: [phone]  [facility]: [phone]    Preterm Labor and Birth Information The normal length of a pregnancy is 39-41 weeks. Preterm labor is when labor starts before 37 completed weeks of pregnancy. What are the risk factors for preterm labor? Preterm labor is more likely to occur in women who:  Have certain infections during pregnancy such as a bladder infection, sexually transmitted infection, or infection inside the uterus (chorioamnionitis).  Have a shorter-than-normal cervix.  Have gone into preterm labor before.  Have had surgery on their cervix.  Are younger than age 24 or older than age 535.  Are African American.  Are pregnant with twins or multiple babies (multiple gestation).  Take street drugs or smoke while pregnant.  Do not gain enough weight while pregnant.  Became pregnant  shortly after having been pregnant. What are the symptoms of preterm labor? Symptoms of preterm labor include:  Cramps similar to those that can happen during a menstrual period. The cramps may happen with diarrhea.  Pain in the abdomen or lower back.  Regular uterine contractions that may feel like tightening of the abdomen.  A feeling of increased pressure in the pelvis.  Increased watery or bloody mucus discharge from the vagina.  Water breaking (ruptured amniotic sac). Why is it important to recognize signs of preterm labor? It is important to recognize signs of preterm labor because babies who are born prematurely may not be fully developed. This can put them at an increased risk for:  Long-term (chronic) heart and lung problems.  Difficulty immediately after birth with regulating body systems, including blood sugar, body temperature, heart rate, and breathing rate.  Bleeding in the brain.  Cerebral palsy.  Learning difficulties.  Death. These risks are highest for babies who are born before 34 weeks of pregnancy. How is preterm labor treated? Treatment depends on the length of your pregnancy, your condition, and the health of your baby. It may involve:  Having a stitch (suture) placed in your cervix to prevent your cervix from opening too early (cerclage).  Taking or being given medicines, such as:  Hormone medicines. These may be given early in pregnancy to help support the pregnancy.  Medicine to stop contractions.  Medicines  to help mature the baby's lungs. These may be prescribed if the risk of delivery is high.  Medicines to prevent your baby from developing cerebral palsy. If the labor happens before 34 weeks of pregnancy, you may need to stay in the hospital. What should I do if I think I am in preterm labor? If you think that you are going into preterm labor, call your health care provider right away. How can I prevent preterm labor in future  pregnancies? To increase your chance of having a full-term pregnancy:  Do not use any tobacco products, such as cigarettes, chewing tobacco, and e-cigarettes. If you need help quitting, ask your health care provider.  Do not use street drugs or medicines that have not been prescribed to you during your pregnancy.  Talk with your health care provider before taking any herbal supplements, even if you have been taking them regularly.  Make sure you gain a healthy amount of weight during your pregnancy.  Watch for infection. If you think that you might have an infection, get it checked right away.  Make sure to tell your health care provider if you have gone into preterm labor before. This information is not intended to replace advice given to you by your health care provider. Make sure you discuss any questions you have with your health care provider. Document Released: 08/13/2003 Document Revised: 11/03/2015 Document Reviewed: 10/14/2015 Elsevier Interactive Patient Education  2017 [employer]

## 2016-05-16 NOTE — Progress Notes (Signed)
Low-risk OB appointment G3P1011 6051w3d Estimated Date of Delivery: 07/22/16 BP 96/60   Pulse 84   Wt 187 lb (84.8 kg)   LMP 08/19/2015 (Approximate)   BMI 32.10 kg/m   BP, weight, and urine reviewed.  Refer to obstetrical flow sheet for FH & FHR.  Reports good fm.  Denies regular uc's, lof, vb, or uti s/s. No complaints. Yeast infection gone, hasn't tried probiotics.  Reviewed ptl s/s, fkc. Plan:  Continue routine obstetrical care  F/U in 2wks for OB appointment

## 2016-05-23 ENCOUNTER — Telehealth: Payer: Self-pay | Admitting: *Deleted

## 2016-05-23 NOTE — Telephone Encounter (Signed)
Pt called complaining of cramping off and on. Baby is moving, maybe not as much as usual. No spotting or bleeding. No leaking fluid. Tightening 3-4 times in the last 30 minutes. I spoke with Selena BattenKim, CNM and she advised to drink a big cup of water and lay down to see if pain gets better. Check baby movement, need to feel 10 movements over 1-2 hours. If tightening happens 4-6 times in 1 hour, call us back during office hours, or call the after hours nurse line if after 5. Pt voiced understanding. JSY

## 2016-05-27 ENCOUNTER — Telehealth: Payer: Self-pay | Admitting: Women's Health

## 2016-05-27 NOTE — Telephone Encounter (Signed)
Pt states she has been having cramping X 5 days but does not feel like contractions and has had diarrhea, pt denies any loss of fluids and reports good FM.  Advised pt cramping can be normal, advised to take Tylenol, use Kaopectate or Imodium A-D for diarrhea, push fluids and rest when can.  If starts having contractions 5 - 7 minutes apart or has gush of fluids to [facility].  Pt verbalized understanding.

## 2016-06-01 ENCOUNTER — Encounter: Payer: Self-pay | Admitting: Advanced Practice Midwife

## 2016-06-01 ENCOUNTER — Ambulatory Visit (INDEPENDENT_AMBULATORY_CARE_PROVIDER_SITE_OTHER): Payer: Medicaid Other | Admitting: Advanced Practice Midwife

## 2016-06-01 VITALS — BP 100/70 | HR 80 | Wt 184.4 lb

## 2016-06-01 DIAGNOSIS — Z3483 Encounter for supervision of other normal pregnancy, third trimester: Secondary | ICD-10-CM

## 2016-06-01 DIAGNOSIS — Z331 Pregnant state, incidental: Secondary | ICD-10-CM

## 2016-06-01 DIAGNOSIS — Z3A33 33 weeks gestation of pregnancy: Secondary | ICD-10-CM

## 2016-06-01 DIAGNOSIS — Z1389 Encounter for screening for other disorder: Secondary | ICD-10-CM

## 2016-06-01 LAB — POCT URINALYSIS DIPSTICK
GLUCOSE UA: NEGATIVE
KETONES UA: NEGATIVE
Nitrite, UA: NEGATIVE
RBC UA: NEGATIVE

## 2016-06-01 NOTE — Progress Notes (Signed)
[id number] 4664w5d Estimated Date of Delivery: 07/22/16  Last menstrual period 08/19/2015, unknown if currently breastfeeding.   BP weight and urine results all reviewed and noted.  Please refer to the obstetrical flow sheet for the fundal height and fetal heart rate documentation:  Patient reports good fetal movement, denies any bleeding and no rupture of membranes symptoms or regular contractions. Patient is without complaints. All questions were answered.  No orders of the defined types were placed in this encounter.   Plan:  Continued routine obstetrical care,    Return in about 2 weeks (around 06/15/2016) for LROB.

## 2016-06-01 NOTE — Patient Instructions (Signed)
Third Trimester of Pregnancy The third trimester is from week 29 through week 40 (months 7 through 9). The third trimester is a time when the unborn baby (fetus) is growing rapidly. At the end of the ninth month, the fetus is about 20 inches in length and weighs 6-10 pounds. Body changes during your third trimester Your body goes through many changes during pregnancy. The changes vary from woman to woman. During the third trimester:  Your weight will continue to increase. You can expect to gain 25-35 pounds (11-16 kg) by the end of the pregnancy.  You may begin to get stretch marks on your hips, abdomen, and breasts.  You may urinate more often because the fetus is moving lower into your pelvis and pressing on your bladder.  You may develop or continue to have heartburn. This is caused by increased hormones that slow down muscles in the digestive tract.  You may develop or continue to have constipation because increased hormones slow digestion and cause the muscles that push waste through your intestines to relax.  You may develop hemorrhoids. These are swollen veins (varicose veins) in the rectum that can itch or be painful.  You may develop swollen, bulging veins (varicose veins) in your legs.  You may have increased body aches in the pelvis, back, or thighs. This is due to weight gain and increased hormones that are relaxing your joints.  You may have changes in your hair. These can include thickening of your hair, rapid growth, and changes in texture. Some women also have hair loss during or after pregnancy, or hair that feels dry or thin. Your hair will most likely return to normal after your baby is born.  Your breasts will continue to grow and they will continue to become tender. A yellow fluid (colostrum) may leak from your breasts. This is the first milk you are producing for your baby.  Your belly button may stick out.  You may notice more swelling in your hands, face, or  ankles.  You may have increased tingling or numbness in your hands, arms, and legs. The skin on your belly may also feel numb.  You may feel short of breath because of your expanding uterus.  You may have more problems sleeping. This can be caused by the size of your belly, increased need to urinate, and an increase in your body's metabolism.  You may notice the fetus "dropping," or moving lower in your abdomen.  You may have increased vaginal discharge.  Your cervix becomes thin and soft (effaced) near your due date. What to expect at prenatal visits You will have prenatal exams every 2 weeks until week 36. Then you will have weekly prenatal exams. During a routine prenatal visit:  You will be weighed to make sure you and the fetus are growing normally.  Your blood pressure will be taken.  Your abdomen will be measured to track your baby's growth.  The fetal heartbeat will be listened to.  Any test results from the previous visit will be discussed.  You may have a cervical check near your due date to see if you have effaced. At around 36 weeks, your health care provider will check your cervix. At the same time, your health care provider will also perform a test on the secretions of the vaginal tissue. This test is to determine if a type of bacteria, Group B streptococcus, is present. Your health care provider will explain this further. Your health care provider may ask you:    What your birth plan is.  How you are feeling.  If you are feeling the baby move.  If you have had any abnormal symptoms, such as leaking fluid, bleeding, severe headaches, or abdominal cramping.  If you are using any tobacco products, including cigarettes, chewing tobacco, and electronic cigarettes.  If you have any questions. Other tests or screenings that may be performed during your third trimester include:  Blood tests that check for low iron levels (anemia).  Fetal testing to check the health,  activity level, and growth of the fetus. Testing is done if you have certain medical conditions or if there are problems during the pregnancy.  Nonstress test (NST). This test checks the health of your baby to make sure there are no signs of problems, such as the baby not getting enough oxygen. During this test, a belt is placed around your belly. The baby is made to move, and its heart rate is monitored during movement. What is false labor? False labor is a condition in which you feel small, irregular tightenings of the muscles in the womb (contractions) that eventually go away. These are called Braxton Hicks contractions. Contractions may last for hours, days, or even weeks before true labor sets in. If contractions come at regular intervals, become more frequent, increase in intensity, or become painful, you should see your health care provider. What are the signs of labor?  Abdominal cramps.  Regular contractions that start at 10 minutes apart and become stronger and more frequent with time.  Contractions that start on the top of the uterus and spread down to the lower abdomen and back.  Increased pelvic pressure and dull back pain.  A watery or bloody mucus discharge that comes from the vagina.  Leaking of amniotic fluid. This is also known as your "water breaking." It could be a slow trickle or a gush. Let your doctor know if it has a color or strange odor. If you have any of these signs, call your health care provider right away, even if it is before your due date. Follow these instructions at home: Eating and drinking  Continue to eat regular, healthy meals.  Do not eat:  Raw meat or meat spreads.  Unpasteurized milk or cheese.  Unpasteurized juice.  Store-made salad.  Refrigerated smoked seafood.  Hot dogs or deli meat, unless they are piping hot.  More than 6 ounces of albacore tuna a week.  Shark, swordfish, king mackerel, or tile fish.  Store-made salads.  Raw  sprouts, such as mung bean or alfalfa sprouts.  Take prenatal vitamins as told by your health care provider.  Take 1000 mg of calcium daily as told by your health care provider.  If you develop constipation:  Take over-the-counter or prescription medicines.  Drink enough fluid to keep your urine clear or pale yellow.  Eat foods that are high in fiber, such as fresh fruits and vegetables, whole grains, and beans.  Limit foods that are high in fat and processed sugars, such as fried and sweet foods. Activity  Exercise only as directed by your health care provider. Healthy pregnant women should aim for 2 hours and 30 minutes of moderate exercise per week. If you experience any pain or discomfort while exercising, stop.  Avoid heavy lifting.  Do not exercise in extreme heat or humidity, or at high altitudes.  Wear low-heel, comfortable shoes.  Practice good posture.  Do not travel far distances unless it is absolutely necessary and only with the approval   of your health care provider.  Wear your seat belt at all times while in a car, on a bus, or on a plane.  Take frequent breaks and rest with your legs elevated if you have leg cramps or low back pain.  Do not use hot tubs, steam rooms, or saunas.  You may continue to have sex unless your health care provider tells you otherwise. Lifestyle  Do not use any products that contain nicotine or tobacco, such as cigarettes and e-cigarettes. If you need help quitting, ask your health care provider.  Do not drink alcohol.  Do not use any medicinal herbs or unprescribed drugs. These chemicals affect the formation and growth of the baby.  If you develop varicose veins:  Wear support pantyhose or compression stockings as told by your healthcare provider.  Elevate your feet for 15 minutes, 3-4 times a day.  Wear a supportive maternity bra to help with breast tenderness. General instructions  Take over-the-counter and prescription  medicines only as told by your health care provider. There are medicines that are either safe or unsafe to take during pregnancy.  Take warm sitz baths to soothe any pain or discomfort caused by hemorrhoids. Use hemorrhoid cream or witch hazel if your health care provider approves.  Avoid cat litter boxes and soil used by cats. These carry germs that can cause birth defects in the baby. If you have a cat, ask someone to clean the litter box for you.  To prepare for the arrival of your baby:  Take prenatal classes to understand, practice, and ask questions about the labor and delivery.  Make a trial run to the hospital.  Visit the hospital and tour the maternity area.  Arrange for maternity or paternity leave through employers.  Arrange for family and friends to take care of pets while you are in the hospital.  Purchase a rear-facing car seat and make sure you know how to install it in your car.  Pack your hospital bag.  Prepare the baby's nursery. Make sure to remove all pillows and stuffed animals from the baby's crib to prevent suffocation.  Visit your dentist if you have not gone during your pregnancy. Use a soft toothbrush to brush your teeth and be gentle when you floss.  Keep all prenatal follow-up visits as told by your health care provider. This is important. Contact a health care provider if:  You are unsure if you are in labor or if your water has broken.  You become dizzy.  You have mild pelvic cramps, pelvic pressure, or nagging pain in your abdominal area.  You have lower back pain.  You have persistent nausea, vomiting, or diarrhea.  You have an unusual or bad smelling vaginal discharge.  You have pain when you urinate. Get help right away if:  You have a fever.  You are leaking fluid from your vagina.  You have spotting or bleeding from your vagina.  You have severe abdominal pain or cramping.  You have rapid weight loss or weight gain.  You have  shortness of breath with chest pain.  You notice sudden or extreme swelling of your face, hands, ankles, feet, or legs.  Your baby makes fewer than 10 movements in 2 hours.  You have severe headaches that do not go away with medicine.  You have vision changes. Summary  The third trimester is from week 29 through week 40, months 7 through 9. The third trimester is a time when the unborn baby (fetus)   is growing rapidly.  During the third trimester, your discomfort may increase as you and your baby continue to gain weight. You may have abdominal, leg, and back pain, sleeping problems, and an increased need to urinate.  During the third trimester your breasts will keep growing and they will continue to become tender. A yellow fluid (colostrum) may leak from your breasts. This is the first milk you are producing for your baby.  False labor is a condition in which you feel small, irregular tightenings of the muscles in the womb (contractions) that eventually go away. These are called Braxton Hicks contractions. Contractions may last for hours, days, or even weeks before true labor sets in.  Signs of labor can include: abdominal cramps; regular contractions that start at 10 minutes apart and become stronger and more frequent with time; watery or bloody mucus discharge that comes from the vagina; increased pelvic pressure and dull back pain; and leaking of amniotic fluid. This information is not intended to replace advice given to you by your health care provider. Make sure you discuss any questions you have with your health care provider. Document Released: 05/17/2001 Document Revised: 10/29/2015 Document Reviewed: 07/24/2012 Elsevier Interactive Patient Education  2017 [employer] Inc.  

## 2016-06-15 ENCOUNTER — Ambulatory Visit (INDEPENDENT_AMBULATORY_CARE_PROVIDER_SITE_OTHER): Payer: Medicaid Other | Admitting: Obstetrics and Gynecology

## 2016-06-15 ENCOUNTER — Encounter: Payer: Self-pay | Admitting: Obstetrics and Gynecology

## 2016-06-15 VITALS — BP 119/96 | HR 87 | Wt 191.2 lb

## 2016-06-15 DIAGNOSIS — Z3A35 35 weeks gestation of pregnancy: Secondary | ICD-10-CM

## 2016-06-15 DIAGNOSIS — Z1389 Encounter for screening for other disorder: Secondary | ICD-10-CM

## 2016-06-15 DIAGNOSIS — Z3483 Encounter for supervision of other normal pregnancy, third trimester: Secondary | ICD-10-CM

## 2016-06-15 DIAGNOSIS — Z331 Pregnant state, incidental: Secondary | ICD-10-CM

## 2016-06-15 LAB — POCT URINALYSIS DIPSTICK
GLUCOSE UA: NEGATIVE
Ketones, UA: NEGATIVE
NITRITE UA: NEGATIVE
Protein, UA: NEGATIVE

## 2016-06-15 NOTE — Progress Notes (Signed)
[id number]  Estimated Date of Delivery: 07/22/16 LROB 473w5d  Blood pressure (!) 119/96, pulse 87, weight 191 lb 3.2 oz (86.7 kg), last menstrual period 08/19/2015, unknown if currently breastfeeding.   Urine results:notable for small amount of blood and 2+ leukocytes   Chief Complaint  Patient presents with  . Routine Prenatal Visit    Patient complaints: none at this time. Patient reports   good fetal movement,                           denies any bleeding , rupture of membranes,or regular contractions.   refer to the ob flow sheet for FH and FHR, ,                          Physical Examination: General appearance - alert, well appearing, and in no distress                                      Abdomen - FH 34 ,                                                         -FHR 156                                                         soft, nontender, nondistended, no masses or organomegaly                                      Pelvic - examination not indicated                                            Questions were answered. Assessment: LROB G3P1011 @ 2573w5d, EDD: 07/22/16                         C/S scheduled as case [id number] 2 pm 07/15/16 Plan:  Continued routine obstetrical care, wishes to have repeat c-section  F/u in 2 weeks for routine prenatal visit    By signing my name below, I, Sonum Patel, attest that this documentation has been prepared under the direction and in the presence of Tilda BurrowJohn V Bridget Westbrooks, MD. Electronically Signed: Sonum Patel, Neurosurgeoncribe. 06/15/16. 9:31 AM.  I personally performed the services described in this documentation, which was SCRIBED in my presence. The recorded information has been reviewed and considered accurate. It has been edited as necessary during review. Tilda BurrowFERGUSON,Vash Quezada V, MD

## 2016-06-17 ENCOUNTER — Telehealth: Payer: Self-pay | Admitting: *Deleted

## 2016-06-17 NOTE — Telephone Encounter (Signed)
Note completed and signed.pt to pickup before office hours end.

## 2016-06-29 ENCOUNTER — Ambulatory Visit (INDEPENDENT_AMBULATORY_CARE_PROVIDER_SITE_OTHER): Payer: Medicaid Other | Admitting: Obstetrics and Gynecology

## 2016-06-29 ENCOUNTER — Encounter: Payer: Self-pay | Admitting: Obstetrics and Gynecology

## 2016-06-29 VITALS — BP 107/76 | HR 93 | Wt 193.0 lb

## 2016-06-29 DIAGNOSIS — Z3A37 37 weeks gestation of pregnancy: Secondary | ICD-10-CM

## 2016-06-29 DIAGNOSIS — Z331 Pregnant state, incidental: Secondary | ICD-10-CM

## 2016-06-29 DIAGNOSIS — O34219 Maternal care for unspecified type scar from previous cesarean delivery: Secondary | ICD-10-CM

## 2016-06-29 DIAGNOSIS — Z1389 Encounter for screening for other disorder: Secondary | ICD-10-CM

## 2016-06-29 DIAGNOSIS — Z3685 Encounter for antenatal screening for Streptococcus B: Secondary | ICD-10-CM

## 2016-06-29 DIAGNOSIS — Z3483 Encounter for supervision of other normal pregnancy, third trimester: Secondary | ICD-10-CM

## 2016-06-29 LAB — POCT URINALYSIS DIPSTICK
Blood, UA: NEGATIVE
GLUCOSE UA: NEGATIVE
Ketones, UA: NEGATIVE
Leukocytes, UA: NEGATIVE
NITRITE UA: NEGATIVE
Protein, UA: NEGATIVE

## 2016-06-29 NOTE — Progress Notes (Signed)
[id number]  [facility]  Chief Complaint  Patient presents with  . Routine Prenatal Visit    GBS, CHL/GC  ____  Patient complaints: none at this time. Patient reports   good fetal movement,                           denies any bleeding , rupture of membranes,or regular contractions.  Blood pressure 107/76, pulse 93, weight 193 lb (87.5 kg), last menstrual period 08/19/2015, unknown if currently breastfeeding.   Urine results: negative refer to the ob flow sheet for FH and FHR, ,                          Physical Examination: General appearance - alert, well appearing, and in no distress                                      Abdomen - FH 35 , marked abd laxity                                                         -FHR 129                                                         soft, nontender, nondistended, no masses or organomegaly                                      Pelvic - normal, GC/Chlamydia and group B strep collected                                            Questions were answered. Assessment: LROB G3P1011 @ 2960w5d , EDD: 07/22/16                          Repeat cesarean for 2/9 Plan:  Continued routine obstetrical care, desires nexplanon after delivery   F/u in 1 weeks for LROB   By signing my name below, I, Sonum Patel, attest that this documentation has been prepared under the direction and in the presence of Tilda BurrowJohn Jere Vanburen V, MD. Electronically Signed: Sonum Patel, Neurosurgeoncribe. 06/29/16. 9:55 AM.  I personally performed the services described in this documentation, which was SCRIBED in my presence. The recorded information has been reviewed and considered accurate. It has been edited as necessary during review. Tilda BurrowFERGUSON,Ruston Fedora V, MD

## 2016-07-02 LAB — CULTURE, BETA STREP (GROUP B ONLY): Strep Gp B Culture: POSITIVE — AB

## 2016-07-02 LAB — GC/CHLAMYDIA PROBE AMP
Chlamydia trachomatis, NAA: NEGATIVE
NEISSERIA GONORRHOEAE BY PCR: NEGATIVE

## 2016-07-05 ENCOUNTER — Telehealth (HOSPITAL_COMMUNITY): Payer: Self-pay | Admitting: *Deleted

## 2016-07-05 NOTE — Telephone Encounter (Signed)
Preadmission screen  

## 2016-07-06 ENCOUNTER — Ambulatory Visit (INDEPENDENT_AMBULATORY_CARE_PROVIDER_SITE_OTHER): Payer: Medicaid Other | Admitting: Advanced Practice Midwife

## 2016-07-06 ENCOUNTER — Encounter: Payer: Self-pay | Admitting: Advanced Practice Midwife

## 2016-07-06 VITALS — BP 122/66 | HR 72 | Wt 196.0 lb

## 2016-07-06 DIAGNOSIS — Z3483 Encounter for supervision of other normal pregnancy, third trimester: Secondary | ICD-10-CM

## 2016-07-06 DIAGNOSIS — Z3A38 38 weeks gestation of pregnancy: Secondary | ICD-10-CM

## 2016-07-06 DIAGNOSIS — Z331 Pregnant state, incidental: Secondary | ICD-10-CM

## 2016-07-06 DIAGNOSIS — Z1389 Encounter for screening for other disorder: Secondary | ICD-10-CM

## 2016-07-06 LAB — POCT URINALYSIS DIPSTICK
Blood, UA: NEGATIVE
Glucose, UA: NEGATIVE
KETONES UA: NEGATIVE
LEUKOCYTES UA: NEGATIVE
NITRITE UA: NEGATIVE
PROTEIN UA: NEGATIVE

## 2016-07-06 NOTE — Progress Notes (Signed)
[id number] 571w5d Estimated Date of Delivery: 07/22/16  Blood pressure 122/66, pulse 72, weight 196 lb (88.9 kg), last menstrual period 08/19/2015, unknown if currently breastfeeding.   BP weight and urine results all reviewed and noted.  Please refer to the obstetrical flow sheet for the fundal height and fetal heart rate documentation:  Patient reports good fetal movement, denies any bleeding and no rupture of membranes symptoms or regular contractions. Patient is without complaints. All questions were answered.36   Orders Placed This Encounter  Procedures  . POCT urinalysis dipstick    Plan:  Continued routine obstetrical care, CS scheduled for 2/9 w/JVF  Return in about 1 week (around 07/13/2016) for LROB.

## 2016-07-07 ENCOUNTER — Encounter (HOSPITAL_COMMUNITY): Payer: Self-pay

## 2016-07-14 ENCOUNTER — Encounter: Payer: Self-pay | Admitting: Women's Health

## 2016-07-14 ENCOUNTER — Ambulatory Visit (INDEPENDENT_AMBULATORY_CARE_PROVIDER_SITE_OTHER): Payer: Medicaid Other | Admitting: Women's Health

## 2016-07-14 ENCOUNTER — Encounter (HOSPITAL_COMMUNITY)
Admission: RE | Admit: 2016-07-14 | Discharge: 2016-07-14 | Disposition: A | Discharge status: Home / Self Care | Payer: Medicaid Other | Source: Ambulatory Visit | Attending: Obstetrics and Gynecology | Admitting: Obstetrics and Gynecology

## 2016-07-14 VITALS — BP 106/80 | HR 76 | Wt 196.0 lb

## 2016-07-14 DIAGNOSIS — Z1389 Encounter for screening for other disorder: Secondary | ICD-10-CM

## 2016-07-14 DIAGNOSIS — K5909 Other constipation: Secondary | ICD-10-CM

## 2016-07-14 DIAGNOSIS — Z3A38 38 weeks gestation of pregnancy: Secondary | ICD-10-CM

## 2016-07-14 DIAGNOSIS — Z331 Pregnant state, incidental: Secondary | ICD-10-CM

## 2016-07-14 DIAGNOSIS — Z3483 Encounter for supervision of other normal pregnancy, third trimester: Secondary | ICD-10-CM

## 2016-07-14 HISTORY — DX: Major depressive disorder, single episode, unspecified: F32.9

## 2016-07-14 HISTORY — DX: Depression, unspecified: F32.A

## 2016-07-14 LAB — POCT URINALYSIS DIPSTICK
Blood, UA: NEGATIVE
Glucose, UA: NEGATIVE
KETONES UA: NEGATIVE
LEUKOCYTES UA: NEGATIVE
NITRITE UA: NEGATIVE
PROTEIN UA: NEGATIVE

## 2016-07-14 LAB — CBC
HEMATOCRIT: 32.9 % — AB (ref 36.0–46.0)
HEMOGLOBIN: 10.7 g/dL — AB (ref 12.0–15.0)
MCH: 25.5 pg — AB (ref 26.0–34.0)
MCHC: 32.5 g/dL (ref 30.0–36.0)
MCV: 78.5 fL (ref 78.0–100.0)
Platelets: 217 10*3/uL (ref 150–400)
RBC: 4.19 MIL/uL (ref 3.87–5.11)
RDW: 14.7 % (ref 11.5–15.5)
WBC: 4.8 10*3/uL (ref 4.0–10.5)

## 2016-07-14 LAB — TYPE AND SCREEN
ABO/RH(D): O POS
Antibody Screen: NEGATIVE

## 2016-07-14 LAB — ABO/RH: ABO/RH(D): O POS

## 2016-07-14 NOTE — Progress Notes (Signed)
Low-risk OB appointment G3P1011 5915w6d Estimated Date of Delivery: 07/22/16 BP 106/80   Pulse 76   Wt 196 lb (88.9 kg)   LMP 08/19/2015 (Approximate)   BMI 33.64 kg/m   BP, weight, and urine reviewed.  Refer to obstetrical flow sheet for FH & FHR.  Reports good fm.  Denies regular uc's, lof, vb, or uti s/s. Constipation- gave printed prevention/relief measures C/s scheduled for tomorrow at 2pm Reviewed labor s/s, fkc. Plan:  Continue routine obstetrical care  F/U in 1wk for c/s incision check

## 2016-07-14 NOTE — Patient Instructions (Signed)
Constipation  Drink plenty of fluid, preferably water, throughout the day  Eat foods high in fiber such as fruits, vegetables, and grains  Exercise, such as walking, is a good way to keep your bowels regular  Drink warm fluids, especially warm prune juice, or decaf coffee  Eat a 1/2 cup of real oatmeal (not instant), 1/2 cup applesauce, and 1/2-1 cup warm prune juice every day  If needed, you may take Colace (docusate sodium) stool softener once or twice a day to help keep the stool soft. If you are pregnant, wait until you are out of your first trimester (12-14 weeks of pregnancy)  If you still are having problems with constipation, you may take Miralax once daily as needed to help keep your bowels regular.  If you are pregnant, wait until you are out of your first trimester (12-14 weeks of pregnancy)    Call the office [phone]) or go to [facility] if:  You begin to have strong, frequent contractions  Your water breaks.  Sometimes it is a big gush of fluid, sometimes it is just a trickle that keeps getting your panties wet or running down your legs  You have vaginal bleeding.  It is normal to have a small amount of spotting if your cervix was checked.   You don't feel your baby moving like normal.  If you don't, get you something to eat and drink and lay down and focus on feeling your baby move.  You should feel at least 10 movements in 2 hours.  If you don't, you should call the office or go to [facility].

## 2016-07-14 NOTE — Patient Instructions (Signed)
21 Rosalyn ChartersDiamond E Sava  07/14/2016   Your procedure is scheduled on:  07/15/2016  Enter through the Main Entrance of [facility] at 1200  PM.  Pick up the phone at the desk and dial [phone]   Call this number if you have problems the morning of surgery: [phone]   Remember:   Do not eat food:After Midnight.  Do not drink clear liquids: 6 Hours before arrival.  Take these medicines the morning of surgery with A SIP OF WATER:none  Do not wear jewelry, make-up or nail polish.  Do not wear lotions, powders, or perfumes. Do not wear deodorant.  Do not shave 48 hours prior to surgery.  Do not bring valuables to the hospital.  [facility] is not   responsible for any belongings or valuables brought to the hospital.  Contacts, dentures or bridgework may not be worn into surgery.  Leave suitcase in the car. After surgery it may be brought to your room.  For patients admitted to the hospital, checkout time is 11:00 AM the day of              discharge.   Patients discharged the day of surgery will not be allowed to drive             home.  Name and phone number of your driver: na  Special Instructions:   N/A   Please read over the following fact sheets that you were given:   Surgical Site Infection Prevention

## 2016-07-14 NOTE — H&P (Signed)
Yvonne Stone is a 26 y.o. female [phone] presenting for repeat cesarean section at 5139wk gestational age. She had a prior cesarean, has declined consideration of TOLAC, , and Plans to breast feed, and is planning Nexplanon for future contraception.  OB History    Gravida Para Term Preterm AB Living   3 1 1   1 1    SAB TAB Ectopic Multiple Live Births   1       1     Past Medical History:  Diagnosis Date  . Depression   . Herpes    genital  . History of cesarean section 12/16/2015  . Mental disorder    postpartum depression  . Migraines   . Supervision of normal pregnancy 12/16/2015    [facility] Initiated Care at   8+5 weeks FOB  Corwin Levinswilliam smith 26 yo bm 11th child Dating By  US Pap  12/16/15 GC/CT Initial:                36+wks: Genetic Screen NT/IT:  CF screen  Anatomic US  Flu vaccine  Tdap Recommended ~ 28wks Glucose Screen  2 hr GBS  Feed Preference  Contraception  Circumcision  Childbirth Classes  Pediatrician    . Vaginal Pap smear, abnormal    Past Surgical History:  Procedure Laterality Date  . CESAREAN SECTION     Family History: family history includes Cancer in her maternal grandmother; Diabetes in her maternal aunt, maternal uncle, mother, paternal aunt, paternal grandfather, and paternal uncle; Heart murmur in her mother, sister, and sister; Hypertension in her father, maternal aunt, maternal grandfather, maternal grandmother, maternal uncle, mother, paternal aunt, paternal grandfather, paternal grandmother, and paternal uncle; Seizures in her paternal grandmother; Stroke in her paternal grandmother; Thyroid disease in her maternal aunt and mother. Social History:  reports that she has never smoked. She has never used smokeless tobacco. She reports that she does not drink alcohol or use drugs.     Maternal Diabetes: No Genetic Screening: Normal Maternal Ultrasounds/Referrals: Normal Fetal Ultrasounds or other Referrals:  None Maternal Substance Abuse:   No Significant Maternal Medications:  None Significant Maternal Lab Results:  Lab values include: Group B Strep positive Other Comments:  urine Pos for GBS early in pregnancy  ROS History dating by early u/s   Last menstrual period 08/19/2015, unknown if currently breastfeeding. Exam  LMP 08/19/2015 (Approximate)   Physical Exam  Constitutional: She is oriented to person, place, and time. She appears well-developed and well-nourished.  HENT:  Head: Normocephalic.  Eyes: Pupils are equal, round, and reactive to light.  Neck: Normal range of motion.  Cardiovascular: Normal rate.   Respiratory: Effort normal.  GI: Soft.  Gravid uterus c/w dates. Vertex presentation.  Musculoskeletal: Normal range of motion.  Neurological: She is alert and oriented to person, place, and time. She has normal reflexes.  Skin: Skin is warm and dry.  Psychiatric: She has a normal mood and affect. Her behavior is normal. Judgment and thought content normal.    Prenatal labs: ABO, Rh: --/--/O POS, O POS (02/08 1030) Antibody: NEG (02/08 1030) Rubella: 2.06 (07/12 1209) RPR: Non Reactive (11/20 0901)  HBsAg: Negative (07/12 1209)  HIV: Non Reactive (11/20 0901)  GBS:   pos urine culture  Assessment/Plan: Pregnancy at 39.0 wk  Prior cesarean not considering TOLAC, for scheduled repeat cesarean Breast feeding  Future contraception: Nexplanon.   Kaisei Gilbo V 07/14/2016, 10:55 PM

## 2016-07-15 ENCOUNTER — Encounter (HOSPITAL_COMMUNITY)
Admission: RE | Disposition: A | Discharge status: Home / Self Care | Payer: Self-pay | Source: Ambulatory Visit | Attending: Obstetrics and Gynecology

## 2016-07-15 ENCOUNTER — Encounter (HOSPITAL_COMMUNITY): Payer: Self-pay

## 2016-07-15 ENCOUNTER — Inpatient Hospital Stay (HOSPITAL_COMMUNITY)
Admission: RE | Admit: 2016-07-15 | Discharge: 2016-07-18 | DRG: 766 | Disposition: A | Discharge status: Home / Self Care | Payer: Medicaid Other | Source: Ambulatory Visit | Attending: Obstetrics and Gynecology | Admitting: Obstetrics and Gynecology

## 2016-07-15 ENCOUNTER — Inpatient Hospital Stay (HOSPITAL_COMMUNITY): Payer: Medicaid Other | Admitting: Anesthesiology

## 2016-07-15 DIAGNOSIS — Z833 Family history of diabetes mellitus: Secondary | ICD-10-CM

## 2016-07-15 DIAGNOSIS — Z3A39 39 weeks gestation of pregnancy: Secondary | ICD-10-CM

## 2016-07-15 DIAGNOSIS — Z8249 Family history of ischemic heart disease and other diseases of the circulatory system: Secondary | ICD-10-CM | POA: Diagnosis not present

## 2016-07-15 DIAGNOSIS — Z98891 History of uterine scar from previous surgery: Secondary | ICD-10-CM

## 2016-07-15 DIAGNOSIS — O34211 Maternal care for low transverse scar from previous cesarean delivery: Secondary | ICD-10-CM | POA: Diagnosis not present

## 2016-07-15 DIAGNOSIS — K5900 Constipation, unspecified: Secondary | ICD-10-CM | POA: Diagnosis not present

## 2016-07-15 DIAGNOSIS — R143 Flatulence: Secondary | ICD-10-CM | POA: Diagnosis not present

## 2016-07-15 DIAGNOSIS — O99824 Streptococcus B carrier state complicating childbirth: Secondary | ICD-10-CM | POA: Diagnosis present

## 2016-07-15 DIAGNOSIS — Z823 Family history of stroke: Secondary | ICD-10-CM

## 2016-07-15 DIAGNOSIS — Z3490 Encounter for supervision of normal pregnancy, unspecified, unspecified trimester: Secondary | ICD-10-CM

## 2016-07-15 LAB — RPR: RPR: NONREACTIVE

## 2016-07-15 SURGERY — Surgical Case
Anesthesia: Spinal | Wound class: Clean Contaminated

## 2016-07-15 MED ORDER — ONDANSETRON HCL 4 MG/2ML IJ SOLN: 4.0000 mg | Freq: Three times a day (TID) | INTRAMUSCULAR | Status: DC | PRN

## 2016-07-15 MED ORDER — SODIUM CHLORIDE 0.9% FLUSH: 3.0000 mL | INTRAVENOUS | Status: DC | PRN

## 2016-07-15 MED ORDER — NALBUPHINE HCL 10 MG/ML IJ SOLN: 5.0000 mg | INTRAMUSCULAR | Status: DC | PRN

## 2016-07-15 MED ORDER — PROMETHAZINE HCL 25 MG/ML IJ SOLN: 6.2500 mg | INTRAMUSCULAR | Status: DC | PRN

## 2016-07-15 MED ORDER — OXYTOCIN 10 UNIT/ML IJ SOLN
INTRAMUSCULAR | Status: AC
  Filled 2016-07-15: qty 4

## 2016-07-15 MED ORDER — CEFAZOLIN SODIUM-DEXTROSE 2-4 GM/100ML-% IV SOLN
2.0000 g | INTRAVENOUS | Status: AC
  Administered 2016-07-15: 2 g via INTRAVENOUS
  Filled 2016-07-15: qty 100

## 2016-07-15 MED ORDER — NALBUPHINE HCL 10 MG/ML IJ SOLN
5.0000 mg | INTRAMUSCULAR | Status: DC | PRN
  Administered 2016-07-15: 5 mg via INTRAVENOUS
  Filled 2016-07-15 (×2): qty 1

## 2016-07-15 MED ORDER — BUPIVACAINE IN DEXTROSE 0.75-8.25 % IT SOLN
INTRATHECAL | Status: DC | PRN
  Administered 2016-07-15: 1.2 mL via INTRATHECAL

## 2016-07-15 MED ORDER — OXYTOCIN 10 UNIT/ML IJ SOLN
INTRAMUSCULAR | Status: AC
Start: 2016-07-15 — End: 2016-07-15
  Filled 2016-07-15: qty 4

## 2016-07-15 MED ORDER — MORPHINE SULFATE (PF) 0.5 MG/ML IJ SOLN
INTRAMUSCULAR | Status: AC
  Filled 2016-07-15: qty 10

## 2016-07-15 MED ORDER — NALOXONE HCL 0.4 MG/ML IJ SOLN: 0.4000 mg | INTRAMUSCULAR | Status: DC | PRN

## 2016-07-15 MED ORDER — DIPHENHYDRAMINE HCL 25 MG PO CAPS: 25.0000 mg | ORAL_CAPSULE | ORAL | Status: DC | PRN

## 2016-07-15 MED ORDER — ONDANSETRON HCL 4 MG/2ML IJ SOLN
INTRAMUSCULAR | Status: AC
  Filled 2016-07-15: qty 2

## 2016-07-15 MED ORDER — WITCH HAZEL-GLYCERIN EX PADS: 1.0000 "application " | MEDICATED_PAD | CUTANEOUS | Status: DC | PRN

## 2016-07-15 MED ORDER — NALBUPHINE HCL 10 MG/ML IJ SOLN: 5.0000 mg | Freq: Once | INTRAMUSCULAR | Status: DC | PRN

## 2016-07-15 MED ORDER — KETOROLAC TROMETHAMINE 30 MG/ML IJ SOLN
30.0000 mg | Freq: Four times a day (QID) | INTRAMUSCULAR | Status: AC | PRN
  Administered 2016-07-15: 30 mg via INTRAVENOUS
  Filled 2016-07-15: qty 1

## 2016-07-15 MED ORDER — SCOPOLAMINE 1 MG/3DAYS TD PT72: 1.0000 | MEDICATED_PATCH | Freq: Once | TRANSDERMAL | Status: DC

## 2016-07-15 MED ORDER — MEPERIDINE HCL 25 MG/ML IJ SOLN: 6.2500 mg | INTRAMUSCULAR | Status: DC | PRN

## 2016-07-15 MED ORDER — SIMETHICONE 80 MG PO CHEW
80.0000 mg | CHEWABLE_TABLET | ORAL | Status: DC
  Administered 2016-07-15 – 2016-07-18 (×3): 80 mg via ORAL
  Filled 2016-07-15 (×3): qty 1

## 2016-07-15 MED ORDER — DIPHENHYDRAMINE HCL 50 MG/ML IJ SOLN: 12.5000 mg | INTRAMUSCULAR | Status: DC | PRN

## 2016-07-15 MED ORDER — PRENATAL MULTIVITAMIN CH
1.0000 | ORAL_TABLET | Freq: Every day | ORAL | Status: DC
  Administered 2016-07-16 – 2016-07-18 (×3): 1 via ORAL
  Filled 2016-07-15 (×3): qty 1

## 2016-07-15 MED ORDER — ONDANSETRON HCL 4 MG/2ML IJ SOLN
INTRAMUSCULAR | Status: DC | PRN
  Administered 2016-07-15: 4 mg via INTRAVENOUS

## 2016-07-15 MED ORDER — ESCITALOPRAM OXALATE 10 MG PO TABS
10.0000 mg | ORAL_TABLET | Freq: Every day | ORAL | Status: DC
  Administered 2016-07-15 – 2016-07-18 (×4): 10 mg via ORAL
  Filled 2016-07-15 (×5): qty 1

## 2016-07-15 MED ORDER — LACTATED RINGERS IV SOLN
INTRAVENOUS | Status: DC | PRN
  Administered 2016-07-15: 08:00:00 via INTRAVENOUS

## 2016-07-15 MED ORDER — MENTHOL 3 MG MT LOZG: 1.0000 | LOZENGE | OROMUCOSAL | Status: DC | PRN

## 2016-07-15 MED ORDER — SCOPOLAMINE 1 MG/3DAYS TD PT72
MEDICATED_PATCH | TRANSDERMAL | Status: DC | PRN
  Administered 2016-07-15: 1 via TRANSDERMAL

## 2016-07-15 MED ORDER — SIMETHICONE 80 MG PO CHEW
80.0000 mg | CHEWABLE_TABLET | ORAL | Status: DC | PRN
  Filled 2016-07-15: qty 1

## 2016-07-15 MED ORDER — LACTATED RINGERS IV SOLN
INTRAVENOUS | Status: DC | PRN
  Administered 2016-07-15: 40 [IU] via INTRAVENOUS

## 2016-07-15 MED ORDER — ACETAMINOPHEN 325 MG PO TABS
650.0000 mg | ORAL_TABLET | ORAL | Status: DC | PRN
  Administered 2016-07-15 – 2016-07-17 (×6): 650 mg via ORAL
  Filled 2016-07-15 (×6): qty 2

## 2016-07-15 MED ORDER — DIBUCAINE 1 % RE OINT: 1.0000 "application " | TOPICAL_OINTMENT | RECTAL | Status: DC | PRN

## 2016-07-15 MED ORDER — NALOXONE HCL 2 MG/2ML IJ SOSY
1.0000 ug/kg/h | PREFILLED_SYRINGE | INTRAVENOUS | Status: DC | PRN
  Filled 2016-07-15: qty 2

## 2016-07-15 MED ORDER — SOD CITRATE-CITRIC ACID 500-334 MG/5ML PO SOLN
30.0000 mL | ORAL | Status: AC
  Administered 2016-07-15: 30 mL via ORAL
  Filled 2016-07-15: qty 15

## 2016-07-15 MED ORDER — CEFAZOLIN SODIUM-DEXTROSE 2-4 GM/100ML-% IV SOLN
INTRAVENOUS | Status: AC
  Filled 2016-07-15: qty 100

## 2016-07-15 MED ORDER — KETOROLAC TROMETHAMINE 30 MG/ML IJ SOLN: 30.0000 mg | Freq: Four times a day (QID) | INTRAMUSCULAR | Status: AC | PRN

## 2016-07-15 MED ORDER — SCOPOLAMINE 1 MG/3DAYS TD PT72
MEDICATED_PATCH | TRANSDERMAL | Status: AC
  Filled 2016-07-15: qty 1

## 2016-07-15 MED ORDER — KETOROLAC TROMETHAMINE 30 MG/ML IJ SOLN
INTRAMUSCULAR | Status: AC
  Administered 2016-07-15: 30 mg via INTRAVENOUS
  Filled 2016-07-15: qty 1

## 2016-07-15 MED ORDER — MORPHINE SULFATE (PF) 4 MG/ML IV SOLN: 1.0000 mg | INTRAVENOUS | Status: DC | PRN

## 2016-07-15 MED ORDER — LACTATED RINGERS IV SOLN
INTRAVENOUS | Status: DC | PRN
  Administered 2016-07-15 (×2): via INTRAVENOUS

## 2016-07-15 MED ORDER — SIMETHICONE 80 MG PO CHEW
80.0000 mg | CHEWABLE_TABLET | Freq: Three times a day (TID) | ORAL | Status: DC
  Administered 2016-07-15 – 2016-07-18 (×8): 80 mg via ORAL
  Filled 2016-07-15 (×8): qty 1

## 2016-07-15 MED ORDER — MIDAZOLAM HCL 2 MG/2ML IJ SOLN: 0.5000 mg | Freq: Once | INTRAMUSCULAR | Status: DC | PRN

## 2016-07-15 MED ORDER — SENNOSIDES-DOCUSATE SODIUM 8.6-50 MG PO TABS
2.0000 | ORAL_TABLET | ORAL | Status: DC
  Administered 2016-07-15 – 2016-07-18 (×3): 2 via ORAL
  Filled 2016-07-15 (×3): qty 2

## 2016-07-15 MED ORDER — DIPHENHYDRAMINE HCL 25 MG PO CAPS: 25.0000 mg | ORAL_CAPSULE | Freq: Four times a day (QID) | ORAL | Status: DC | PRN

## 2016-07-15 MED ORDER — FENTANYL CITRATE (PF) 100 MCG/2ML IJ SOLN
INTRAMUSCULAR | Status: DC | PRN
  Administered 2016-07-15: 10 ug via INTRAVENOUS

## 2016-07-15 MED ORDER — TETANUS-DIPHTH-ACELL PERTUSSIS 5-2.5-18.5 LF-MCG/0.5 IM SUSP: 0.5000 mL | Freq: Once | INTRAMUSCULAR | Status: DC

## 2016-07-15 MED ORDER — KETOROLAC TROMETHAMINE 30 MG/ML IJ SOLN
30.0000 mg | Freq: Four times a day (QID) | INTRAMUSCULAR | Status: DC | PRN
  Administered 2016-07-15: 30 mg via INTRAMUSCULAR

## 2016-07-15 MED ORDER — FENTANYL CITRATE (PF) 100 MCG/2ML IJ SOLN
INTRAMUSCULAR | Status: AC
  Filled 2016-07-15: qty 2

## 2016-07-15 MED ORDER — OXYCODONE HCL 5 MG PO TABS
5.0000 mg | ORAL_TABLET | ORAL | Status: DC | PRN
  Administered 2016-07-16 – 2016-07-18 (×3): 5 mg via ORAL
  Filled 2016-07-15 (×3): qty 1

## 2016-07-15 MED ORDER — LACTATED RINGERS IV SOLN
INTRAVENOUS | Status: DC
  Administered 2016-07-15: 07:00:00 via INTRAVENOUS

## 2016-07-15 MED ORDER — LACTATED RINGERS IV SOLN
INTRAVENOUS | Status: DC
  Administered 2016-07-15 (×2): via INTRAVENOUS

## 2016-07-15 MED ORDER — ZOLPIDEM TARTRATE 5 MG PO TABS: 5.0000 mg | ORAL_TABLET | Freq: Every evening | ORAL | Status: DC | PRN

## 2016-07-15 MED ORDER — OXYTOCIN 40 UNITS IN LACTATED RINGERS INFUSION - SIMPLE MED: 2.5000 [IU]/h | INTRAVENOUS | Status: AC

## 2016-07-15 MED ORDER — COCONUT OIL OIL: 1.0000 "application " | TOPICAL_OIL | Status: DC | PRN

## 2016-07-15 MED ORDER — IBUPROFEN 600 MG PO TABS
600.0000 mg | ORAL_TABLET | Freq: Four times a day (QID) | ORAL | Status: DC
Start: 2016-07-15 — End: 2016-07-18
  Administered 2016-07-15 – 2016-07-18 (×11): 600 mg via ORAL
  Filled 2016-07-15 (×11): qty 1

## 2016-07-15 MED ORDER — PHENYLEPHRINE 8 MG IN D5W 100 ML (0.08MG/ML) PREMIX OPTIME
INJECTION | INTRAVENOUS | Status: AC
  Filled 2016-07-15: qty 100

## 2016-07-15 MED ORDER — OXYCODONE HCL 5 MG PO TABS
10.0000 mg | ORAL_TABLET | ORAL | Status: DC | PRN
  Administered 2016-07-16 – 2016-07-18 (×3): 10 mg via ORAL
  Filled 2016-07-15 (×4): qty 2

## 2016-07-15 MED ORDER — KETOROLAC TROMETHAMINE 30 MG/ML IJ SOLN: 30.0000 mg | Freq: Four times a day (QID) | INTRAMUSCULAR | Status: DC | PRN

## 2016-07-15 MED ORDER — MORPHINE SULFATE (PF) 0.5 MG/ML IJ SOLN
INTRAMUSCULAR | Status: DC | PRN
  Administered 2016-07-15: .2 mg via EPIDURAL

## 2016-07-15 MED ORDER — PHENYLEPHRINE 8 MG IN D5W 100 ML (0.08MG/ML) PREMIX OPTIME
INJECTION | INTRAVENOUS | Status: DC | PRN
  Administered 2016-07-15: 60 ug/min via INTRAVENOUS

## 2016-07-15 SURGICAL SUPPLY — 39 items
BENZOIN TINCTURE PRP APPL 2/3 (GAUZE/BANDAGES/DRESSINGS) ×3 IMPLANT
CHLORAPREP W/TINT 26ML (MISCELLANEOUS) ×3 IMPLANT
CLAMP CORD UMBIL (MISCELLANEOUS) IMPLANT
CLOSURE WOUND 1/2 X4 (GAUZE/BANDAGES/DRESSINGS)
CLOTH BEACON ORANGE TIMEOUT ST (SAFETY) ×3 IMPLANT
DRSG OPSITE POSTOP 4X10 (GAUZE/BANDAGES/DRESSINGS) ×3 IMPLANT
ELECT REM PT RETURN 9FT ADLT (ELECTROSURGICAL) ×3
ELECTRODE REM PT RTRN 9FT ADLT (ELECTROSURGICAL) ×1 IMPLANT
EXTRACTOR VACUUM KIWI (MISCELLANEOUS) IMPLANT
GLOVE BIO SURGEON ST LM GN SZ9 (GLOVE) ×3 IMPLANT
GLOVE BIOGEL PI IND STRL 7.0 (GLOVE) ×1 IMPLANT
GLOVE BIOGEL PI IND STRL 9 (GLOVE) ×1 IMPLANT
GLOVE BIOGEL PI INDICATOR 7.0 (GLOVE) ×2
GLOVE BIOGEL PI INDICATOR 9 (GLOVE) ×2
GOWN STRL REUS W/TWL 2XL LVL3 (GOWN DISPOSABLE) ×3 IMPLANT
GOWN STRL REUS W/TWL LRG LVL3 (GOWN DISPOSABLE) ×3 IMPLANT
NEEDLE HYPO 25X5/8 SAFETYGLIDE (NEEDLE) IMPLANT
NS IRRIG 1000ML POUR BTL (IV SOLUTION) ×3 IMPLANT
PACK C SECTION WH (CUSTOM PROCEDURE TRAY) ×3 IMPLANT
PAD ABD 8X10 STRL (GAUZE/BANDAGES/DRESSINGS) ×3 IMPLANT
PAD OB MATERNITY 4.3X12.25 (PERSONAL CARE ITEMS) ×3 IMPLANT
PENCIL SMOKE EVAC W/HOLSTER (ELECTROSURGICAL) ×3 IMPLANT
RTRCTR C-SECT PINK 25CM LRG (MISCELLANEOUS) IMPLANT
RTRCTR C-SECT PINK 34CM XLRG (MISCELLANEOUS) IMPLANT
SPONGE GAUZE 4X4 FOR O.R. (GAUZE/BANDAGES/DRESSINGS) ×6 IMPLANT
SPONGE LAP 18X18 X RAY DECT (DISPOSABLE) ×3 IMPLANT
STRIP CLOSURE SKIN 1/2X4 (GAUZE/BANDAGES/DRESSINGS) IMPLANT
SUT MNCRL 0 VIOLET CTX 36 (SUTURE) ×3 IMPLANT
SUT MONOCRYL 0 CTX 36 (SUTURE) ×6
SUT PLAIN 2 0 (SUTURE) ×2
SUT PLAIN ABS 2-0 CT1 27XMFL (SUTURE) ×1 IMPLANT
SUT VIC AB 0 CT1 27 (SUTURE) ×2
SUT VIC AB 0 CT1 27XBRD ANBCTR (SUTURE) ×1 IMPLANT
SUT VIC AB 2-0 CT1 27 (SUTURE) ×2
SUT VIC AB 2-0 CT1 TAPERPNT 27 (SUTURE) ×1 IMPLANT
SUT VIC AB 4-0 KS 27 (SUTURE) ×3 IMPLANT
SYR BULB IRRIGATION 50ML (SYRINGE) IMPLANT
TOWEL OR 17X24 6PK STRL BLUE (TOWEL DISPOSABLE) ×3 IMPLANT
TRAY FOLEY CATH SILVER 14FR (SET/KITS/TRAYS/PACK) ×3 IMPLANT

## 2016-07-15 NOTE — Interval H&P Note (Signed)
History and Physical Interval Note:  07/15/2016 7:17 AM  Yvonne Stone  has presented today for surgery, with the diagnosis of PREGNANCY 39 WEEKS,PREV CESAREAN, DECLINING TRIAL OF LABOR. The various methods of treatment have been discussed with the patient and family. After consideration of risks, benefits and other options for treatment, the patient has consented to  Procedure(s): REPEAT CESAREAN SECTION (N/A) as a surgical intervention .  The patient's history has been reviewed, patient examined, no change in status, stable for surgery.  I have reviewed the patient's chart and labs.  Questions were answered to the patient's satisfaction.     Tilda BurrowFERGUSON,Kaliope Quinonez V

## 2016-07-15 NOTE — Addendum Note (Signed)
Addendum  created 07/15/16 1231 by Jairo Benarswell Jalisa Sacco, MD   Order list changed, Order sets accessed

## 2016-07-15 NOTE — Lactation Note (Addendum)
This note was copied from a baby's chart. Lactation Consultation Note  Patient Name: Yvonne Stone XBJYN'WToday's Date: 07/15/2016 Reason for consult: Initial assessment  Initial visit at 12 hours of life. Mom is a P2 who nursed her 1st child for 3 months before her "milk dried up." Mom & MGM report that her milk came to volume on the 4th day postpartum w/her 1st child.  Mom is aware that "Chosen" is DAT+. I discussed beginning to express her milk, in case of rising bilirubin levels & need for supplementation. Mom was interested in learning hand expression. We hand expressed 765mL+ w/ease. "Chosen" is in a deep sleep cycle right now, but it will be spoon-fed to him when he awakes. Mom has my # to call for assist when he awakes.   Mom made aware of O/P services, breastfeeding support groups, community resources, and our phone # for post-discharge questions.   Mom takes Lexapro 10 mg qd (L2). 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  Lurline HareRichey, Zyriah Mask [facility] 07/15/2016, 8:27 PM

## 2016-07-15 NOTE — Anesthesia Preprocedure Evaluation (Addendum)
Anesthesia Evaluation  Patient identified by MRN, date of birth, ID band Patient awake    Reviewed: Allergy & Precautions, NPO status , Patient's Chart, lab work & pertinent test results  History of Anesthesia Complications Negative for: history of anesthetic complications  Airway Mallampati: II  TM Distance: >3 FB Neck ROM: Full    Dental  (+) Dental Advisory Given   Pulmonary neg pulmonary ROS,    breath sounds clear to auscultation       Cardiovascular negative cardio ROS   Rhythm:Regular Rate:Normal     Neuro/Psych  Headaches,    GI/Hepatic negative GI ROS, Neg liver ROS, neg GERD  ,  Endo/Other  negative endocrine ROS  Renal/GU negative Renal ROS     Musculoskeletal   Abdominal   Peds  Hematology negative hematology ROS (+) Hb 10.7, plt 271K   Anesthesia Other Findings   Reproductive/Obstetrics (+) Pregnancy                            Anesthesia Physical Anesthesia Plan  ASA: II  Anesthesia Plan: Spinal   Post-op Pain Management:    Induction:   Airway Management Planned: Natural Airway and Simple Face Mask  Additional Equipment:   Intra-op Plan:   Post-operative Plan:   Informed Consent: I have reviewed the patients History and Physical, chart, labs and discussed the procedure including the risks, benefits and alternatives for the proposed anesthesia with the patient or authorized representative who has indicated his/her understanding and acceptance.   Dental advisory given  Plan Discussed with: CRNA and Surgeon  Anesthesia Plan Comments: (Plan routine monitors, SAB)        Anesthesia Quick Evaluation

## 2016-07-15 NOTE — Anesthesia Postprocedure Evaluation (Signed)
Anesthesia Post Note  Patient: Yvonne ChartersDiamond E Weick  Procedure(s) Performed: Procedure(s) (LRB): REPEAT CESAREAN SECTION (N/A)  Patient location during evaluation: PACU Anesthesia Type: Spinal Level of consciousness: awake and alert, oriented and patient cooperative Pain management: pain level controlled Vital Signs Assessment: post-procedure vital signs reviewed and stable Respiratory status: spontaneous breathing, nonlabored ventilation and respiratory function stable Cardiovascular status: blood pressure returned to baseline and stable Postop Assessment: spinal receding and patient able to bend at knees Anesthetic complications: no        Last Vitals:  Vitals:   07/15/16 0848 07/15/16 0915  BP: (!) 128/117 112/84  Pulse: 65 61  Resp:  16  Temp: 36.6 C     Last Pain:  Vitals:   07/15/16 0915  TempSrc:   PainSc: 1    Pain Goal: Patients Stated Pain Goal: 5 (07/15/16 16100614)               Erling CruzJACKSON,E. Andretta Ergle

## 2016-07-15 NOTE — Addendum Note (Signed)
Addendum  created 07/15/16 1610 by Orlie Pollenebra R Jayshon Dommer, CRNA   Sign clinical note

## 2016-07-15 NOTE — Anesthesia Postprocedure Evaluation (Addendum)
Anesthesia Post Note  Patient: Rosalyn ChartersDiamond E Greff  Procedure(s) Performed: Procedure(s) (LRB): REPEAT CESAREAN SECTION (N/A)  Patient location during evaluation: PACU Anesthesia Type: Spinal Level of consciousness: oriented and awake and alert Pain management: pain level controlled Vital Signs Assessment: post-procedure vital signs reviewed and stable Respiratory status: spontaneous breathing, respiratory function stable and patient connected to nasal cannula oxygen Cardiovascular status: blood pressure returned to baseline and stable Postop Assessment: no headache and no backache Anesthetic complications: no        Last Vitals:  Vitals:   07/15/16 1230 07/15/16 1330  BP: 123/70 121/73  Pulse: 63 60  Resp: 17 18  Temp: 36.5 C 36.6 C    Last Pain:  Vitals:   07/15/16 1330  TempSrc: Axillary  PainSc: 0-No pain   Pain Goal: Patients Stated Pain Goal: 5 (07/15/16 [id number])               Marrion CoyMERRITT,DEBRA

## 2016-07-15 NOTE — Anesthesia Procedure Notes (Deleted)
Spinal

## 2016-07-15 NOTE — Transfer of Care (Signed)
Immediate Anesthesia Transfer of Care Note  Patient: Yvonne Stone  Procedure(s) Performed: Procedure(s): REPEAT CESAREAN SECTION (N/A)  Patient Location: PACU  Anesthesia Type:Spinal  Level of Consciousness: awake and alert   Airway & Oxygen Therapy: Patient Spontanous Breathing  Post-op Assessment: Report given to RN and Post -op Vital signs reviewed and stable  Post vital signs: Reviewed  Last Vitals:  Vitals:   07/15/16 0614  BP: 137/80  Resp: 18  Temp: 36.9 C    Last Pain:  Vitals:   07/15/16 0614  TempSrc: Oral      Patients Stated Pain Goal: 5 (07/15/16 16100614)  Complications: No apparent anesthesia complications

## 2016-07-15 NOTE — Progress Notes (Signed)
UR chart review completed.  

## 2016-07-15 NOTE — Anesthesia Procedure Notes (Addendum)
Spinal  Patient location during procedure: OR Start time: 07/15/2016 7:38 AM End time: 07/15/2016 7:38 AM Staffing Anesthesiologist: Jairo BenJACKSON, [place] Performed: other anesthesia staff and anesthesiologist  Preanesthetic Checklist Completed: patient identified, site marked, surgical consent, pre-op evaluation, timeout performed, IV checked, risks and benefits discussed and monitors and equipment checked Spinal Block Patient position: sitting Prep: DuraPrep Patient monitoring: continuous pulse ox, blood pressure and heart rate Approach: midline Location: L2-3 Injection technique: single-shot Needle Needle type: Pencan  Needle gauge: 24 G Needle length: 10 cm Assessment Sensory level: T4 Additional Notes Fabio NeighborsMichelle Yoder, SRNA supervised by Sandford Craze. Kashira Behunin, MD for procedure. Patient positioned. Landmarks identified. Sterile prep and drape. Lidocaine 1% wheal placed. Introducer catheter inserted and stabilized with left hand as spinal needle inserted. Negative for blood aspiration, positive CSF flow. Bupivacaine 0.75% with dextrose 1.582mL with 10mcg Fentanyl and 0.2mg  Duramorph injected with positive CSF swirl prior to start of injection, mid injection and at the end of injection of spinal dose. Pt positioned supine with left uterine displacement. Assessed level with pinwheel, T4 level of block. Negative allis test prior to start of procedure by Dr. Emelda FearFerguson.

## 2016-07-15 NOTE — Op Note (Signed)
Cesarean Section Operative Report  Yvonne ChartersDiamond E Gemmer  07/15/2016  Indications: Scheduled Proceedure/Maternal Request   Pre-operative Diagnosis: PREV CS.   Post-operative Diagnosis: Same   Surgeon: Surgeon(s) and Role:    * Tilda BurrowJohn Ferguson V, MD - Primary    * Lorne SkeensNicholas Michael Lamari Youngers, MD - Fellow   Assistants: none  Anesthesia: spinal    Estimated Blood Loss: 600 ml  Specimens: none  Findings: Viable female infant in cephalix presentation; Apgars 8 and 10; arterial cord pH not collected; clear amniotic fluid; intact placenta with three vessel cord; normal uterus, fallopian tubes and ovaries bilaterally.  Baby condition / location:  Couplet care / Skin to Skin   Complications: no complications  Indications: Yvonne Stone is a 26 y.o. [phone] with an IUP 5487w0d presenting elective repeat c-section.  Procedure Details:  The patient was taken back to the operative suite where spinal anesthesia was placed.  A time out was held and the above information confirmed.   After induction of anesthesia, the patient was draped and prepped in the usual sterile manner and placed in a dorsal supine position with a leftward tilt. A Pfannenstiel incision was made and carried down through the subcutaneous tissue to the fascia. Fascial incision was made and sharply extended transversely. The fascia was separated from the underlying rectus tissue superiorly and inferiorly. The peritoneum was identified and sharply entered and extended longitudinally. Alexis retractor was placed. A low transverse uterine incision was made and extended bluntly. Delivered from cephalic presentation was a viable infant with Apgarst as above.  After waiting 60 seconds for delayed cord cutting, the umbilical cord was clamped and cut cord blood was obtained for evaluation. Cord ph was not sent. The placenta was removed Intact and appeared normal. The uterine outline, tubes and ovaries appeared normal. The uterine incision was  closed with running locked sutures of 1-0 monocyl with an imbricating layer of the same.   Hemostasis was observed. The peritoneum was closed with  vircyl. The rectus muscles were examined and hemostasis observed. The fascia was then reapproximated with running sutures of 0Vicryl. The subcuticular closure was performed using 2-0plain gut. The skin was closed with 4-0Vicryl.   Instrument, sponge, and needle counts were correct prior the abdominal closure and were correct at the conclusion of the case.     Disposition: PACU - hemodynamically stable.       SignedLes Pou: Jarick Harkins SchenkMD 07/15/2016 9:46 AM

## 2016-07-16 DIAGNOSIS — O34211 Maternal care for low transverse scar from previous cesarean delivery: Secondary | ICD-10-CM

## 2016-07-16 DIAGNOSIS — Z3A39 39 weeks gestation of pregnancy: Secondary | ICD-10-CM

## 2016-07-16 LAB — CBC
HCT: 23 % — ABNORMAL LOW (ref 36.0–46.0)
Hemoglobin: 8 g/dL — ABNORMAL LOW (ref 12.0–15.0)
MCH: 27.1 pg (ref 26.0–34.0)
MCHC: 34.8 g/dL (ref 30.0–36.0)
MCV: 78 fL (ref 78.0–100.0)
Platelets: 162 10*3/uL (ref 150–400)
RBC: 2.95 MIL/uL — AB (ref 3.87–5.11)
RDW: 14.5 % (ref 11.5–15.5)
WBC: 5.4 10*3/uL (ref 4.0–10.5)

## 2016-07-16 LAB — BIRTH TISSUE RECOVERY COLLECTION (PLACENTA DONATION)

## 2016-07-16 MED ORDER — POLYETHYLENE GLYCOL 3350 17 G PO PACK
17.0000 g | PACK | Freq: Every day | ORAL | Status: DC
  Administered 2016-07-16 – 2016-07-18 (×3): 17 g via ORAL
  Filled 2016-07-16 (×4): qty 1

## 2016-07-16 NOTE — Progress Notes (Signed)
Patient ID: Yvonne Stone, female   DOB: December 01, 1990, 26 y.o.   MRN: [id number]  POSTPARTUM PROGRESS NOTE  Post Op/Partum Day #1 Subjective:  Yvonne ChartersDiamond E Lograsso is a 26 y.o. [id number] 7855w0d s/p RLTCS.  No acute events overnight.  Pt denies problems with ambulating, voiding or po intake.  She denies nausea or vomiting.  Pain is moderately controlled.  She has not had flatus. She has not had bowel movement.  Lochia Small.   Objective: Blood pressure 100/60, pulse 68, temperature 98 F (36.7 C), temperature source Oral, resp. rate 20, height 5\' 4"  (1.626 m), weight 196 lb (88.9 kg), last menstrual period 08/19/2015, SpO2 97 %, unknown if currently breastfeeding.  Physical Exam:  General: alert, cooperative and no distress Lochia:normal flow Chest: CTAB Heart: RRR no m/r/g Abdomen: +BS, soft, nontender, mildly distended Incision: clean, dry, intact, minimal old blood noted Uterine Fundus: firm, below umbilicus DVT Evaluation: No calf swelling or tenderness Extremities: Trace edema   Recent Labs  07/14/16 1030 07/16/16 0555  HGB 10.7* 8.0*  HCT 32.9* 23.0*    Assessment/Plan:  ASSESSMENT: Yvonne ChartersDiamond E Dwiggins is a 26 y.o. [id number] 2955w0d s/p RLTCS  Repeat AM CBC Hb dropped from 10.7>8.0 Breastfeeding and Lactation consult  Contraception: Nexplanon Miralax Plan for discharge tomorrow if flatus occurs or BM   LOS: 1 day   Jen MowElizabeth Soundra Lampley, DO OB Fellow [facility]  07/16/2016, 3:31 PM

## 2016-07-16 NOTE — Lactation Note (Signed)
This note was copied from a baby's chart. Lactation Consultation Note  Patient Name: Boy Susa SimmondsDiamond Salo ZOXWR'UToday's Date: 07/16/2016 Reason for consult: Follow-up assessment  "Chosen" was spoon-fed EBM w/ease. I later returned to assist w/latching per Mom's request. Chosen latched well on the R breast, using the "teacup hold." Swallows verified by cervical auscultation. Mom comfortable w/latch. Infant fed for 30 minutes. I showed Mom how we could increase frequency of swallows w/breast compressions. Nipple shape was not distorted when Chosen released latch. Chosen was placed in bassinet, satiated.    Mom has WIC & DEBP set-up (in light of +DAT), but Mom to sleep first. MGM to be present in room, overnight.   Lurline HareRichey, [facility] 07/16/2016, 12:26 AM

## 2016-07-16 NOTE — Clinical Social Work Maternal (Signed)
  CLINICAL SOCIAL WORK MATERNAL/CHILD NOTE  Patient Details  Name: Yvonne Stone MRN: [id number] Date of Birth: [birth year]  Date:  07/16/2016  Clinical Social Worker Initiating Note:  Ferdinand Lango Clarke Amburn, MSW, LCSW-A  Date/ Time Initiated:  07/16/16/1439     Child's Name:  Yvonne Stone    Legal Guardian:  Other (Comment) (Not established by court system; MOB primary care giver at this time. )   Need for Interpreter:  None   Date of Referral:  07/15/16     Reason for Referral:  Other (Comment) (MOB hx of depression )   Referral Source:  RN   Address:  [address] [place] [address]  Phone number:  [phone]   Household Members:  Self, Minor Children   Natural Supports (not living in the home):  Parent, Friends, Immediate Family   Professional Supports: Therapist [facility] )   Employment: Part-time   Type of Work: Unknown.    Education:      Museum/gallery curator Resources:  [employer]   Other Resources:  [employer], Food Stamps    Cultural/Religious Considerations Which May Impact Care:  None reported at this time.   Strengths:  Ability to meet basic needs , Pediatrician chosen , Compliance with medical plan , Home prepared for child  ([place] )   Risk Factors/Current Problems:  Other (Comment) (Hx of depression )   Cognitive State:  Able to Concentrate , Alert , Goal Oriented , Insightful    Mood/Affect:  Calm , Comfortable , Interested    CSW Assessment: CSW met with MOB at bedside to complete assessment for consult regarding depression. Upon this writers arrival MOB was relaxing in bed while baby was asleep in basinet. This Probation officer explained role and reasoning or visit. MOB was pleasant and engaging of this Probation officer and confirmed her hx of depression. MOB noted she has been dealing with it for a few years but was not able to recall how many. She notes [facility] [place]. MOB notes she finds it very helpful for her  there. MOB notes she is currently taking Lexapro once a day everyday. MOB further notes she finds it to be very helpful. This Probation officer praised MOB for acknowledging mental health concerns and seeking treatment. MOB was happy and notes she plans to continue taking medication and attending therapy post d/c. This Probation officer discussed PPD and safe sleeping/SIDS. MOB verbalized understanding. At this time, MOB denies any further need for additional resources/ support. She notes she has a good support system in place to help her and baby if need be. CSW finds no barriers to d/c thus case closed to this CSW.   CSW Plan/Description:  Engineer, mining , No Further Intervention Required/No Barriers to Discharge   Oda Cogan, MSW, [facility]  Office: [phone]

## 2016-07-16 NOTE — Lactation Note (Signed)
This note was copied from a baby's chart. Lactation Consultation Note  Patient Name: Yvonne Stone UJWJX'BToday's Date: 07/16/2016 Reason for consult: Follow-up assessment Baby at 34 hr of life. Mom is requesting help because she thinks the baby is hungry. Upon entry mom was holding a screaming baby away from her with a frustrated look on her face. Demonstrated how to sooth baby. Discussed baby behavior, feeding frequency, baby belly size, voids, wt loss, breast changes, and nipple care. Baby latched well and denies breast or nipple pain. She will bf baby on demand and call for help as needed.    Maternal Data    Feeding Feeding Type: Breast Fed Length of feed: 20 min  LATCH Score/Interventions Latch: Grasps breast easily, tongue down, lips flanged, rhythmical sucking.  Audible Swallowing: Spontaneous and intermittent Intervention(s): Skin to skin;Hand expression;Alternate breast massage  Type of Nipple: Everted at rest and after stimulation  Comfort (Breast/Nipple): Soft / non-tender     Hold (Positioning): Assistance needed to correctly position infant at breast and maintain latch. Intervention(s): Position options;Support Pillows  LATCH Score: 9  Lactation Tools Discussed/Used     Consult Status Consult Status: Follow-up Date: 07/17/16 Follow-up type: In-patient    Rulon Eisenmengerlizabeth E Gerritt [place] 07/16/2016, 6:52 PM

## 2016-07-16 NOTE — Progress Notes (Signed)
POSTPARTUM PROGRESS NOTE  Post Partum Day 1  Subjective:  Yvonne Stone is a 26 y.o. [id number] 5753w0d s/p RLTCS.  No acute events overnight.  Pt denies problems with ambulating, voiding or po intake.  She denies nausea or vomiting.  Pain is well controlled.  She has not had flatus. She has not had bowel movement.  Lochia Small.   Objective: Blood pressure 98/68, pulse 63, temperature 97.5 F (36.4 C), temperature source Oral, resp. rate 18, height 5\' 4"  (1.626 m), weight 88.9 kg (196 lb), last menstrual period 08/19/2015, SpO2 97 %, unknown if currently breastfeeding.  Physical Exam:  General: alert, cooperative and no distress Lochia:normal flow Chest: CTAB Heart: RRR no m/r/g Abdomen: +BS, soft, nontender,  Uterine Fundus: firm, below level of umbilicus DVT Evaluation: No calf swelling or tenderness Extremities: No lower extremity edema   Recent Labs  07/14/16 1030 07/16/16 0555  HGB 10.7* 8.0*  HCT 32.9* 23.0*    Assessment/Plan:  ASSESSMENT: Yvonne Stone is a 26 y.o. [id number] 2253w0d s/p RLTCS doing well today but having difficulties with breastfeeding/latching.  Plan for discharge tomorrow, Breastfeeding, Lactation consult and Contraception nexplanon. Circumcision outpatient.    LOS: 1 day   Satira AnisSung W Park, Medical Student 07/16/2016, 9:11 AM   OB FELLOW MEDICAL STUDENT NOTE ATTESTATION  I have seen and examined this patient. Note this is a Psychologist, occupationalmedical student note and as such does not necessarily reflect the patient's plan of care. Please see progress note for this date of service.    Jen MowElizabeth Jesslyn Viglione, DO [place] Fellow 07/16/2016

## 2016-07-17 ENCOUNTER — Encounter (HOSPITAL_COMMUNITY): Payer: Self-pay | Admitting: Obstetrics and Gynecology

## 2016-07-17 DIAGNOSIS — K5900 Constipation, unspecified: Secondary | ICD-10-CM

## 2016-07-17 LAB — CBC
HCT: 24.5 % — ABNORMAL LOW (ref 36.0–46.0)
HEMOGLOBIN: 8.2 g/dL — AB (ref 12.0–15.0)
MCH: 26.1 pg (ref 26.0–34.0)
MCHC: 33.5 g/dL (ref 30.0–36.0)
MCV: 78 fL (ref 78.0–100.0)
PLATELETS: 189 10*3/uL (ref 150–400)
RBC: 3.14 MIL/uL — AB (ref 3.87–5.11)
RDW: 14.8 % (ref 11.5–15.5)
WBC: 5.5 10*3/uL (ref 4.0–10.5)

## 2016-07-17 MED ORDER — DOCUSATE SODIUM 100 MG PO CAPS: 100.0000 mg | ORAL_CAPSULE | Freq: Two times a day (BID) | ORAL | 0 refills | Status: DC

## 2016-07-17 MED ORDER — IBUPROFEN 600 MG PO TABS: 600.0000 mg | ORAL_TABLET | Freq: Four times a day (QID) | ORAL | 0 refills | Status: DC

## 2016-07-17 MED ORDER — OXYCODONE-ACETAMINOPHEN 5-325 MG PO TABS: 1.0000 | ORAL_TABLET | ORAL | 0 refills | Status: DC | PRN

## 2016-07-17 NOTE — Lactation Note (Signed)
This note was copied from a baby's chart. Lactation Consultation Note  Patient Name: [name] ZOXWR'UToday's Date: 07/17/2016 Reason for consult: Follow-up assessment Baby at 59 hr of life. Mom is worried because baby has been sleepy today and not bf as often. Upon entry baby was latched in football position. Mom denies breast or nipple pain. Discussed baby behavior, feeding frequency, baby belly size, voids, wt loss, breast changes, and nipple care. She will bf on demand and pump as needed. She is aware of lactation services and support group.     Maternal Data    Feeding Feeding Type: Breast Fed Length of feed: 15 min  LATCH Score/Interventions Latch: Grasps breast easily, tongue down, lips flanged, rhythmical sucking. Intervention(s): Adjust position  Audible Swallowing: Spontaneous and intermittent Intervention(s): Skin to skin;Hand expression;Alternate breast massage  Type of Nipple: Everted at rest and after stimulation  Comfort (Breast/Nipple): Soft / non-tender     Hold (Positioning): No assistance needed to correctly position infant at breast. Intervention(s): Breastfeeding basics reviewed  LATCH Score: 10  Lactation Tools Discussed/Used     Consult Status Consult Status: Follow-up Date: 07/18/16 Follow-up type: In-patient    Rulon Eisenmengerlizabeth E Destane Speas 07/17/2016, 7:02 PM

## 2016-07-17 NOTE — Progress Notes (Signed)
Patient ID: Yvonne Stone, female   DOB: 09-29-1990, 26 y.o.   MRN: [id number]  POSTPARTUM PROGRESS NOTE  Post Op/Partum Day #2 Subjective:  Yvonne Stone is a 26 y.o. [id number] 1417w0d s/p RLTCS.  No acute events overnight.  Pt denies problems with ambulating, voiding or po intake.  She denies nausea or vomiting.  Pain is well controlled.  She has not had flatus. She has not had bowel movement.  Patient has tried stool softeners and Miralax yesterday. Willing to try enema today. Lochia Minimal.   Objective: Blood pressure (!) 113/55, pulse 62, temperature 97.7 F (36.5 C), temperature source Oral, resp. rate 16, height 5\' 4"  (1.626 m), weight 196 lb (88.9 kg), last menstrual period 08/19/2015, SpO2 97 %, unknown if currently breastfeeding.  Physical Exam:  General: alert, cooperative and no distress Lochia:normal flow Chest: CTAB Heart: RRR no m/r/g Abdomen: +BS, soft, nontender,  Uterine Fundus: firm, below umbilicus DVT Evaluation: No calf swelling or tenderness Extremities: Trace edema   Recent Labs  07/16/16 0555 07/17/16 0555  HGB 8.0* 8.2*  HCT 23.0* 24.5*    Assessment/Plan:  ASSESSMENT: Yvonne Stone is a 26 y.o. W4X3244G3P2012 5217w0d s/p RLTCS w/ BTL.  Plan for discharge tomorrow , possibly today if +flatus Enema this AM Miralax this evening, Senna this evening Encouraged ambulation Routine postpartum care   LOS: 2 days   Jen MowElizabeth Mumaw, DO OB Fellow Center [facility], [facility]  07/17/2016, 10:40 AM

## 2016-07-17 NOTE — Discharge Instructions (Signed)

## 2016-07-17 NOTE — Discharge Summary (Signed)
OB Discharge Summary  Patient Name: Yvonne Stone DOB: August 01, 1990 MRN: [id number]  Date of admission: 07/15/2016 Delivering MD: Christin BachFERGUSON, JOHN   Date of discharge: 07/17/2016  Admitting diagnosis: PREV CS Intrauterine pregnancy: 5970w0d     Secondary diagnosis:Principal Problem:   Status post repeat low transverse cesarean section Active Problems:   Supervision of normal pregnancy   History of cesarean section   History of cesarean delivery  Additional problems:none     Discharge diagnosis: Term Pregnancy Delivered                                                                     Post partum procedures:n/a  Augmentation: Pitocin  Complications: None  Hospital course:  Onset of Labor With Vaginal Delivery     26 y.o. yo Q6V7846G3P2012 at 7170w0d was admitted in Latent Labor on 07/15/2016. Patient had an uncomplicated labor course as follows:  Membrane Rupture Time/Date: 10:46 AM ,07/15/2016   Intrapartum Procedures: Episiotomy: None [1]                                         Lacerations:  None [1]  Patient had a delivery of a Viable infant. 07/15/2016  Information for the patient's newborn:  Miguel RotaWilson, Boy Miray [[id number]][[id number]]  Delivery Method: C-Section, Low Transverse (Filed from Delivery Summary)    Pateint had an uncomplicated postpartum course.  She is ambulating, tolerating a regular diet, passing flatus, and urinating well. Patient is discharged home in stable condition on 07/17/16.   Physical exam  Vitals:   07/16/16 0339 07/16/16 0900 07/16/16 1935 07/17/16 0636  BP: 98/68 100/60 111/64 (!) 113/55  Pulse: 63 68 81 62  Resp: 18 20 (!) 81 16  Temp: 97.5 F (36.4 C) 98 F (36.7 C) 98.2 F (36.8 C) 97.7 F (36.5 C)  TempSrc: Oral Oral Oral Oral  SpO2: 97%     Weight:      Height:       General: alert, cooperative and no distress Lochia: appropriate Uterine Fundus: firm Incision: N/A DVT Evaluation: No evidence of DVT seen on physical exam. Labs: Lab Results   Component Value Date   WBC 5.5 07/17/2016   HGB 8.2 (L) 07/17/2016   HCT 24.5 (L) 07/17/2016   MCV 78.0 07/17/2016   PLT 189 07/17/2016   No flowsheet data found.  Discharge instruction: per After Visit Summary and "Baby and Me Booklet".  After Visit Meds:  Allergies as of 07/17/2016   No Known Allergies     Medication List    TAKE these medications   acetaminophen 500 MG tablet Commonly known as:  TYLENOL Take 500 mg by mouth every 6 (six) hours as needed for headache.   [employer] PRENATAL GUMMY PO Take 2 tablets by mouth daily.   docusate sodium 100 MG capsule Commonly known as:  COLACE Take 1 capsule (100 mg total) by mouth 2 (two) times daily.   escitalopram 10 MG tablet Commonly known as:  LEXAPRO Take 1 tablet (10 mg total) by mouth daily.   ibuprofen 600 MG tablet Commonly known as:  ADVIL,MOTRIN Take 1 tablet (600 mg  total) by mouth every 6 (six) hours.   oxyCODONE-acetaminophen 5-325 MG tablet Commonly known as:  ROXICET Take 1-2 tablets by mouth every 4 (four) hours as needed for severe pain.       Diet: routine diet  Activity: Advance as tolerated. Pelvic rest for 6 weeks.   Outpatient follow up:6 weeks Follow up Appt:Future Appointments Date Time Provider Department Center  07/21/2016 1:45 PM Cheral Marker, CNM [facility]  07/27/2016 9:30 AM Tilda Burrow, MD [facility]   Follow up visit: No Follow-up on file.  Postpartum contraception: Undecided  Newborn Data: Live born female  Birth Weight: 7 lb 2.8 oz (3255 g) APGAR: 8, 10  Baby Feeding: Bottle and Breast Disposition:home with mother   07/17/2016 Wyvonnia Dusky, CNM

## 2016-07-17 NOTE — Progress Notes (Addendum)
Patient has not passed flatus audible bowel sounds and  order  for soap suds enema. Warm prune and apple juice also given patient had good results with enema

## 2016-07-17 NOTE — Progress Notes (Signed)
Patient ID: Yvonne Stone, female   DOB: 05/20/1991, 26 y.o.   MRN: [id number] POSTPARTUM PROGRESS NOTE  Post Operative Day 2 Subjective:  Yvonne Stone is a 26 y.o. G3O7564G3P2012 1382w0d s/p U/S.  No acute events overnight.  Pt denies problems with ambulating, voiding or po intake.  She reports nausea, but denies vomiting.  Pain is moderately controlled.  She has not had flatus. She has not had bowel movement since 3 days prior to admission.  Lochia Small.   Objective: Blood pressure (!) 113/55, pulse 62, temperature 97.7 F (36.5 C), temperature source Oral, resp. rate 16, height 5\' 4"  (1.626 m), weight 88.9 kg (196 lb), last menstrual period 08/19/2015, SpO2 97 %, unknown if currently breastfeeding.  Physical Exam:  General: alert, cooperative and no distress Lochia:normal flow Chest: CTAB, normal work of breathing Heart: RRR no m/r/g, normal s1 & s2 Abdomen: +BS, soft, diffuse tenderness, clean surgical bandage without erythema or drainage  DVT Evaluation: No calf swelling or tenderness;  Extremities: no edema; +2 dorsalis pedis pulses   Recent Labs  07/16/16 0555 07/17/16 0555  HGB 8.0* 8.2*  HCT 23.0* 24.5*    Assessment/Plan:  ASSESSMENT: Yvonne Stone is a 26 y.o. P3I9518G3P2012 4982w0d s/p U/S POD#2 RTLC  Plan for discharge tomorrow, Breastfeeding and Contraception Nexplanon;  BM/Flatus yet to occur; will consider  Enema in AM   LOS: 2 days   Collie Siadgar Ogar, Medical Student MS3 Center for [facility], [facility]  07/17/2016, 10:19 AM

## 2016-07-18 DIAGNOSIS — Z3A39 39 weeks gestation of pregnancy: Secondary | ICD-10-CM

## 2016-07-18 DIAGNOSIS — O34211 Maternal care for low transverse scar from previous cesarean delivery: Secondary | ICD-10-CM

## 2016-07-18 DIAGNOSIS — R143 Flatulence: Secondary | ICD-10-CM

## 2016-07-18 MED ORDER — OXYCODONE HCL 5 MG PO TABS: 5.0000 mg | ORAL_TABLET | ORAL | 0 refills | Status: DC | PRN

## 2016-07-18 MED ORDER — IBUPROFEN 600 MG PO TABS: 600.0000 mg | ORAL_TABLET | Freq: Four times a day (QID) | ORAL | 0 refills | Status: DC | PRN

## 2016-07-18 MED ORDER — DOCUSATE SODIUM 100 MG PO CAPS: 100.0000 mg | ORAL_CAPSULE | Freq: Two times a day (BID) | ORAL | 0 refills | Status: DC

## 2016-07-18 NOTE — Lactation Note (Signed)
This note was copied from a baby's chart. Lactation Consultation Note  Baby latched in football upon entering.  Sucks and swallows observed. Encouraged mother to compress breast during feeding to keep baby active. Re-latched on other breast in football.  Mom encouraged to feed baby 8-12 times/24 hours and with feeding cues.  Reminded mother to wake baby for feedings if needed and set alarm. Provided mother w/manual pump. She will post pump approx 4 times a day and give volume back to baby until weight stabilizes.   Patient Name: Yvonne Stone: 07/18/2016 Reason for consult: Follow-up assessment   Maternal Data    Feeding Feeding Type: Breast Fed Length of feed: 30 min  LATCH Score/Interventions Latch: Grasps breast easily, tongue down, lips flanged, rhythmical sucking. Intervention(s): Adjust position  Audible Swallowing: A few with stimulation Intervention(s): Skin to skin;Hand expression  Type of Nipple: Everted at rest and after stimulation  Comfort (Breast/Nipple): Soft / non-tender     Hold (Positioning): Assistance needed to correctly position infant at breast and maintain latch.  LATCH Score: 8  Lactation Tools Discussed/Used     Consult Status Consult Status: Complete    Hardie PulleyBerkelhammer, Ruth Boschen 07/18/2016, 1:11 PM

## 2016-07-18 NOTE — Discharge Summary (Signed)
OB Discharge Summary     Patient Name: BO ROGUE DOB: [birth year] MRN: [id number]  Date of admission: 07/15/2016 Delivering MD: Christin Bach   Date of discharge: 07/18/2016  Admitting diagnosis: PREV CS Intrauterine pregnancy: [redacted]w[redacted]d     Secondary diagnosis:  Principal Problem:   Status post repeat low transverse cesarean section Active Problems:   Supervision of normal pregnancy   History of cesarean section   History of cesarean delivery  Additional problems: none     Discharge diagnosis: Term Pregnancy Delivered                                                                                                Post partum procedures:none  Augmentation: AROM  Complications: None  Hospital course:  Sceduled C/S   26 y.o. yo [id number] at [redacted]w[redacted]d was admitted to the hospital 07/15/2016 for scheduled cesarean section with the following indication:Elective Repeat.  Membrane Rupture Time/Date: 10:46 AM ,07/15/2016   Patient delivered a Viable infant.07/15/2016  Details of operation can be found in separate operative note.  Pateint had an uncomplicated postpartum course.  She is ambulating, tolerating a regular diet, passing flatus, and urinating well. Patient is discharged home in stable condition on  07/18/16         Physical exam  Vitals:   07/16/16 1935 07/17/16 0636 07/17/16 1820 07/18/16 0642  BP: 111/64 (!) 113/55 112/71 110/69  Pulse: 81 62 63 82  Resp: (!) 81 16 18 16   Temp: 98.2 F (36.8 C) 97.7 F (36.5 C) 97.9 F (36.6 C) 97.7 F (36.5 C)  TempSrc: Oral Oral Oral Oral  SpO2:      Weight:      Height:       General: alert and cooperative Lochia: appropriate Uterine Fundus: firm Incision: Healing well with no significant drainage, No significant erythema, Dressing is clean, dry, and intact DVT Evaluation: No evidence of DVT seen on physical exam. Labs: Lab Results  Component Value Date   WBC 5.5 07/17/2016   HGB 8.2 (L) 07/17/2016   HCT 24.5 (L) 07/17/2016    MCV 78.0 07/17/2016   PLT 189 07/17/2016   No flowsheet data found.  Discharge instruction: per After Visit Summary and "Baby and Me Booklet".  After visit meds:  Allergies as of 07/18/2016   No Known Allergies     Medication List    TAKE these medications   acetaminophen 500 MG tablet Commonly known as:  TYLENOL Take 500 mg by mouth every 6 (six) hours as needed for headache.   [employer] PRENATAL GUMMY PO Take 2 tablets by mouth daily.   docusate sodium 100 MG capsule Commonly known as:  COLACE Take 1 capsule (100 mg total) by mouth 2 (two) times daily.   docusate sodium 100 MG capsule Commonly known as:  COLACE Take 1 capsule (100 mg total) by mouth 2 (two) times daily.   escitalopram 10 MG tablet Commonly known as:  LEXAPRO Take 1 tablet (10 mg total) by mouth daily.   ibuprofen 600 MG tablet Commonly known as:  ADVIL,MOTRIN Take 1 tablet (600 mg  total) by mouth every 6 (six) hours.   ibuprofen 600 MG tablet Commonly known as:  ADVIL,MOTRIN Take 1 tablet (600 mg total) by mouth every 6 (six) hours as needed for mild pain or moderate pain.   oxyCODONE 5 MG immediate release tablet Commonly known as:  Oxy IR/ROXICODONE Take 1 tablet (5 mg total) by mouth every 4 (four) hours as needed (pain scale 4-7).   oxyCODONE-acetaminophen 5-325 MG tablet Commonly known as:  ROXICET Take 1-2 tablets by mouth every 4 (four) hours as needed for severe pain.       Diet: routine diet  Activity: Advance as tolerated. Pelvic rest for 6 weeks.   Outpatient follow up:6 weeks Follow up Appt: Future Appointments Date Time Provider Department Center  07/21/2016 1:45 PM Cheral MarkerKimberly R Booker, CNM [facility]  07/27/2016 9:30 AM Tilda BurrowJohn Ferguson V, MD [facility]   Follow up Visit:No Follow-up on file.  Postpartum contraception: Nexplanon  Newborn Data: Live born female  Birth Weight: 7 lb 2.8 oz (3255 g) APGAR: 8, 10  Baby Feeding: Breast Disposition:home with  mother   07/18/2016 Renne Muscaaniel L Warden, MD  CNM attestation:  I have seen and examined this patient; I agree with above documentation in the resident's note.   Rosalyn ChartersDiamond E Takeshita is a 26 y.o. [id number] reporting no complaint today, up ad lib, tolerating PO, +flatus/BM  PE: BP 110/69 (BP Location: Right Arm)   Pulse 82   Temp 97.7 F (36.5 C) (Oral)   Resp 16   Ht 5\' 4"  (1.626 m)   Wt 196 lb (88.9 kg)   LMP 08/19/2015 (Approximate)   SpO2 97%   Breastfeeding? Unknown   BMI 33.64 kg/m  Gen: calm comfortable, NAD Resp: normal effort, no distress Abd: soft/NT/fundus firm   ROS, labs, PMH reviewed   Plan: [facility]   Tawnya CrookHogan, Anyah Swallow Donovan, CNM 10:34 AM

## 2016-07-18 NOTE — Lactation Note (Signed)
This note was copied from a baby's chart. Lactation Consultation Note  Baby 6474 hours old.  8.3% weight loss. Baby sleeping after feeding.   Mom has my # to call for assist w/next feeding. Recommend placing baby STS after 3 hours if he has not woke for feeding. Mom encouraged to feed baby 8-12 times/24 hours and with feeding cues.  Set an alarm for feedings if needed. Mother recently pumped approx 8 ml which she will give him at next feeding.    Patient Name: Boy Susa SimmondsDiamond Yerby ZOXWR'UToday's Date: 07/18/2016     Maternal Data    Feeding    LATCH Score/Interventions                      Lactation Tools Discussed/Used     Consult Status      Dahlia ByesBerkelhammer, Paxson Harrower [facility] 07/18/2016, 10:07 AM

## 2016-07-20 ENCOUNTER — Telehealth: Payer: Self-pay | Admitting: *Deleted

## 2016-07-20 NOTE — Telephone Encounter (Signed)
Patient called stating incision was "burning". Advised patient to continue taking Ibuprofen and Percocet and to splint incision site when coughing, laughing, etc. Pt verbalized understanding. Has incision check tomorrow.

## 2016-07-20 NOTE — Telephone Encounter (Signed)
Called patient to inform her of low hgb 9.6. Encouraged her to continue taking PNV and to increase intake of green leafy vegetables, nuts, beans, red meat. Pt verbalized understanding.

## 2016-07-20 NOTE — Telephone Encounter (Signed)
Hgb 9.6

## 2016-07-21 ENCOUNTER — Encounter: Payer: Medicaid Other | Admitting: Women's Health

## 2016-07-22 ENCOUNTER — Ambulatory Visit (INDEPENDENT_AMBULATORY_CARE_PROVIDER_SITE_OTHER): Payer: Medicaid Other | Admitting: Women's Health

## 2016-07-22 ENCOUNTER — Encounter: Payer: Self-pay | Admitting: Women's Health

## 2016-07-22 VITALS — BP 112/74 | HR 76 | Wt 183.8 lb

## 2016-07-22 DIAGNOSIS — Z4889 Encounter for other specified surgical aftercare: Secondary | ICD-10-CM

## 2016-07-22 NOTE — Patient Instructions (Signed)
NO SEX UNTIL AFTER YOU GET YOUR BIRTH CONTROL  

## 2016-07-22 NOTE — Progress Notes (Signed)
   [facility] Visit  Patient name: Rosalyn ChartersDiamond E Olvera MRN [id number]  Date of birth: Mar 12, 1991  CC & HPI:  Rosalyn ChartersDiamond E Punt is a 26 y.o. [id number] African American female 1wk s/p RLTCS presenting today for incision check. Breastfeeding going well. Wants nexplanon, hasn't ordered.  No LMP recorded.  Pertinent History Reviewed:  Medical & Surgical Hx:   Past medical, surgical, family, and social history reviewed in electronic medical record Medications: Reviewed & Updated - see associated section Allergies: Reviewed in electronic medical record  Objective Findings:  Vitals: BP 112/74 (BP Location: Right Arm, Patient Position: Sitting, Cuff Size: Normal)   Pulse 76   Wt 183 lb 12.8 oz (83.4 kg)   Breastfeeding? Yes   BMI 31.55 kg/m  Body mass index is 31.55 kg/m.  Physical Examination: General appearance - alert, well appearing, and in no distress C/S Incision: honeycomb dressing removed, steri-strips removed, incision healing well, well-approximate, no s/s infection  No results found for this or any previous visit (from the past 24 hour(s)).   Assessment & Plan:  A:   1wk s/p RLTCS  Incision check  P:  No sex until nexplanon insertion, order today  Return for 4wks for pp visit, then separate visit for nexplanon insertion- order today please.  Marge DuncansBooker, Kimberly Randall CNM, [facility] 07/22/2016 12:08 PM

## 2016-07-25 ENCOUNTER — Telehealth: Payer: Self-pay | Admitting: *Deleted

## 2016-07-25 NOTE — Telephone Encounter (Signed)
Informed patient that K.Booker, CNM would not refill oxycodone. Patient was seen last week with normal looking incision and stated burning was probably coming from nerves. Advised patient if she was hurting that bad, she needed to see a MD. Pt very frustrated stating "if incision looked normal, I'm not entitled to getting pain medication because I'm in pain." Offered patient appointment with MD but patient declined. Will call if she changes her mind.

## 2016-07-25 NOTE — Telephone Encounter (Signed)
Patient called stating she is still having incisional burning and pain. She is taking Ibuprofen with no relief and would like her Oxycodone 10mg  refilled. Please advise.

## 2016-07-27 ENCOUNTER — Ambulatory Visit: Payer: Medicaid Other | Admitting: Obstetrics and Gynecology

## 2016-08-19 ENCOUNTER — Encounter: Payer: Self-pay | Admitting: Women's Health

## 2016-08-19 ENCOUNTER — Ambulatory Visit (INDEPENDENT_AMBULATORY_CARE_PROVIDER_SITE_OTHER): Payer: Medicaid Other | Admitting: Women's Health

## 2016-08-19 ENCOUNTER — Other Ambulatory Visit: Payer: Self-pay | Admitting: Family Medicine

## 2016-08-19 DIAGNOSIS — F32A Depression, unspecified: Secondary | ICD-10-CM

## 2016-08-19 DIAGNOSIS — Z98891 History of uterine scar from previous surgery: Secondary | ICD-10-CM

## 2016-08-19 DIAGNOSIS — F329 Major depressive disorder, single episode, unspecified: Secondary | ICD-10-CM

## 2016-08-19 NOTE — Progress Notes (Signed)
Subjective:    Yvonne Stone is a 26 y.o. G30P2012 African American female who presents for a postpartum visit. She is 5 weeks postpartum following a repeat cesarean section, low transverse incision at 39 gestational weeks. Anesthesia: spinal. I have fully reviewed the prenatal and intrapartum course. Postpartum course has been uncomplicated. Baby's course has been uncomplicated. Baby is feeding by breast, doesn't have enough to pump/store. Bleeding brown spotting. Bowel function is occ constipation. Bladder function is normal. Patient is not sexually active. Last sexual activity: prior to birth of baby. Contraception method is wants nexplanon. Postpartum depression screening: positive. Score 16. h/o depression, on lexapro  daily- only takes 'when remembers', counseling [facility]- is supposed to make appt, but forgot. Decreased appetite at times, not sleeping well d/t baby and 2yo, still finds joy in things she used to. Denies SI/HI/II. Overall feels she is coping well. Last pap July 2017 and was neg, has h/o abnormal paps.  The following portions of the patient's history were reviewed and updated as appropriate: allergies, current medications, past medical history, past surgical history and problem list.  Review of Systems Pertinent items are noted in HPI.   Vitals:   08/19/16 1031  BP: 90/60  Pulse: 76  Weight: 175 lb 6.4 oz (79.6 kg)   No LMP recorded.  Objective:   General:  alert, cooperative and no distress   Breasts:  deferred, no complaints  Lungs: clear to auscultation bilaterally  Heart:  regular rate and rhythm  Abdomen: soft, nontender, c/s incision well healed   Vulva: normal  Vagina: normal vagina  Cervix:  closed  Corpus: Well-involuted  Adnexa:  Non-palpable  Rectal Exam: No hemorrhoids        Assessment:   Postpartum exam 5 wks s/p RLTCS Breastfeeding Depression screening Contraception counseling   Plan:  Contraception: abstinence until  nexplanon Follow up in: on Wed as scheduled for nexplanon  Take lexapro daily as rx'd, set alarm to help remember Call [facility] to make f/u appt Let us know if feels depression worsening Gave tips on increasing milk supply and constipation prevention/relief  Marge Duncans CNM, [facility] 08/19/2016 10:59 AM

## 2016-08-19 NOTE — Patient Instructions (Addendum)
Take your lexapro every day [facility] to schedule appointment  NO SEX UNTIL AFTER YOU GET YOUR BIRTH CONTROL   Tips To Increase Milk Supply  Lots of water! Enough so that your urine is clear  Plenty of calories, if you're not getting enough calories, your milk supply can decrease  Breastfeed/pump often, every 2-3 hours x 20-5730mins  Fenugreek 3 pills 3 times a day, this may make your urine smell like maple syrup  Mother's Milk Tea  Lactation cookies, google for the recipe  Real oatmeal   Constipation  Drink plenty of fluid, preferably water, throughout the day  Eat foods high in fiber such as fruits, vegetables, and grains  Exercise, such as walking, is a good way to keep your bowels regular  Drink warm fluids, especially warm prune juice, or decaf coffee  Eat a 1/2 cup of real oatmeal (not instant), 1/2 cup applesauce, and 1/2-1 cup warm prune juice every day  If needed, you may take Colace (docusate sodium) stool softener once or twice a day to help keep the stool soft. If you are pregnant, wait until you are out of your first trimester (12-14 weeks of pregnancy)  If you still are having problems with constipation, you may take Miralax once daily as needed to help keep your bowels regular.  If you are pregnant, wait until you are out of your first trimester (12-14 weeks of pregnancy)

## 2016-08-24 ENCOUNTER — Encounter: Payer: Self-pay | Admitting: Women's Health

## 2016-08-24 ENCOUNTER — Ambulatory Visit (INDEPENDENT_AMBULATORY_CARE_PROVIDER_SITE_OTHER): Payer: Medicaid Other | Admitting: Women's Health

## 2016-08-24 VITALS — BP 104/66 | HR 68 | Ht 64.0 in | Wt 173.0 lb

## 2016-08-24 DIAGNOSIS — Z3049 Encounter for surveillance of other contraceptives: Secondary | ICD-10-CM | POA: Diagnosis not present

## 2016-08-24 DIAGNOSIS — Z3202 Encounter for pregnancy test, result negative: Secondary | ICD-10-CM

## 2016-08-24 DIAGNOSIS — Z30017 Encounter for initial prescription of implantable subdermal contraceptive: Secondary | ICD-10-CM | POA: Insufficient documentation

## 2016-08-24 LAB — POCT URINE PREGNANCY: Preg Test, Ur: NEGATIVE

## 2016-08-24 NOTE — Patient Instructions (Signed)

## 2016-08-24 NOTE — Progress Notes (Signed)
Yvonne Stone is a 26 y.o. year old African American female here for Nexplanon insertion.  No LMP recorded., last sexual intercourse was prior to birth of her baby, and her pregnancy test today was negative.  Risks/benefits/side effects of Nexplanon have been discussed and her questions have been answered.  Specifically, a failure rate of 06/998 has been reported, with an increased failure rate if pt takes St. John's Wort and/or antiseizure medicaitons.  Yvonne Stone is aware of the common side effect of irregular bleeding, which the incidence of decreases over time. h/o depression, taking lexapro 10mg  daily- getting better at taking daily like she's supposed to. [facility], still plans to. Denies SI/HI/II. Understands Nexplanon can potentially worsen depression, if it does, let us know.   BP 104/66 (BP Location: Right Arm, Patient Position: Sitting, Cuff Size: Normal)   Pulse 68   Ht 5\' 4"  (1.626 m)   Wt 173 lb (78.5 kg)   Breastfeeding? Yes   BMI 29.70 kg/m   Results for orders placed or performed in visit on 08/24/16 (from the past 24 hour(s))  POCT urine pregnancy   Collection Time: 08/24/16 11:10 AM  Result Value Ref Range   Preg Test, Ur Negative Negative     She is right-handed, so her left arm, approximately 4 inches proximal from the elbow, was cleansed with alcohol and anesthetized with 2cc of 2% Lidocaine.  The area was cleansed again with betadine and the Nexplanon was inserted per manufacturer's recommendations without difficulty.  3 steri-strips and pressure bandage were applied.  Pt was instructed to keep the area clean and dry, remove pressure bandage in 24 hours, and keep insertion site covered with the steri-strip for 3-5 days.  Back up contraception was recommended for 2 weeks.  She was given a card indicating date Nexplanon was inserted and date it needs to be removed. Follow-up PRN problems, then July for physical or sooner if needed.    Marge DuncansBooker, Chelsia Serres Randall CNM, [facility] 08/24/2016 11:14 AM

## 2016-09-22 ENCOUNTER — Encounter: Payer: Self-pay | Admitting: *Deleted

## 2016-09-22 ENCOUNTER — Ambulatory Visit: Payer: Medicaid Other | Admitting: Women's Health

## 2016-09-27 ENCOUNTER — Encounter: Payer: Self-pay | Admitting: Advanced Practice Midwife

## 2016-09-27 ENCOUNTER — Ambulatory Visit (INDEPENDENT_AMBULATORY_CARE_PROVIDER_SITE_OTHER): Payer: Medicaid Other | Admitting: Advanced Practice Midwife

## 2016-09-27 VITALS — BP 110/72 | HR 80 | Ht 64.0 in | Wt 168.0 lb

## 2016-09-27 DIAGNOSIS — B9689 Other specified bacterial agents as the cause of diseases classified elsewhere: Secondary | ICD-10-CM

## 2016-09-27 DIAGNOSIS — N760 Acute vaginitis: Secondary | ICD-10-CM | POA: Diagnosis not present

## 2016-09-27 MED ORDER — CLINDAMYCIN PHOSPHATE 100 MG VA SUPP: 100.0000 mg | Freq: Every day | VAGINAL | 0 refills | Status: DC

## 2016-09-27 NOTE — Progress Notes (Signed)
[facility] Visit  Patient name: Yvonne Stone MRN [id number]  Date of birth: 1990-07-15  CC & HPI:  Yvonne Stone is a 26 y.o. African American female presenting today for vaginal irritation for about a week. Had heavy discharge at one time, not today though. Has not tried any OTC remedies.  Has been bleeding most of the time since Nexplanon last month.   Pertinent History Reviewed:  Medical & Surgical Hx:   Past Medical History:  Diagnosis Date  . Depression   . Herpes    genital  . History of cesarean section 12/16/2015  . Mental disorder    postpartum depression  . Migraines   . Supervision of normal pregnancy 12/16/2015    [facility] at   8+5 weeks FOB  Corwin Levins 25 yo bm 11th child Dating By  [place] Pap  12/16/15 GC/CT Initial:                36+wks: Genetic Screen NT/IT:  CF screen  Anatomic [place]  Flu vaccine  Tdap Recommended ~ 28wks Glucose Screen  2 hr GBS  Feed Preference  Contraception  Circumcision  Childbirth Classes  Pediatrician    . Vaginal Pap smear, abnormal    Past Surgical History:  Procedure Laterality Date  . CESAREAN SECTION    . CESAREAN SECTION N/A 07/15/2016   Procedure: REPEAT CESAREAN SECTION;  Surgeon: Tilda Burrow, MD;  Location: [facility] B[facility]TH[facility];  Service: Obstetrics;  Laterality: N/A;   Family History  Problem Relation Age of Onset  . Heart murmur Mother   . Hypertension Mother   . Diabetes Mother   . Thyroid disease Mother   . Heart murmur Sister   . Cancer Maternal Grandmother     breast   . Hypertension Maternal Grandmother   . Heart murmur Sister   . Hypertension Father   . Hypertension Maternal Grandfather   . Thyroid disease Maternal Aunt   . Hypertension Maternal Aunt   . Diabetes Maternal Aunt   . Diabetes Maternal Uncle   . Hypertension Maternal Uncle   . Diabetes Paternal Aunt   . Hypertension Paternal Aunt   . Hypertension Paternal Uncle   . Diabetes Paternal Uncle   . Seizures  Paternal Grandmother   . Stroke Paternal Grandmother   . Hypertension Paternal Grandmother   . Diabetes Paternal Grandfather   . Hypertension Paternal Grandfather     Current Outpatient Prescriptions:  .  escitalopram (LEXAPRO) 10 MG tablet, Take 10 mg by mouth daily., Disp: , Rfl: 6 .  Prenatal Vit-Min-FA-Fish Oil ([employer] PRENATAL GUMMY PO), Take 2 tablets by mouth daily., Disp: , Rfl:  .  clindamycin (CLEOCIN) 100 MG vaginal suppository, Place 1 suppository (100 mg total) vaginally at bedtime., Disp: 3 suppository, Rfl: 0 Social History: Reviewed -  reports that she has never smoked. She has never used smokeless tobacco.  Review of Systems:   Constitutional: Negative for fever and chills Eyes: Negative for visual disturbances Respiratory: Negative for shortness of breath, dyspnea Cardiovascular: Negative for chest pain or palpitations  Gastrointestinal: Negative for vomiting, diarrhea and constipation; no abdominal pain Genitourinary: Negative for dysuria and urgency, vaginal irritation or itching Musculoskeletal: Negative for back pain, joint pain, myalgias  Neurological: Negative for dizziness and headaches    Objective Findings:    Physical Examination: General appearance - well appearing, and in no distress Mental status - alert, oriented to person, place, and time Chest:  Normal respiratory  effort Heart - normal rate and regular rhythm Abdomen:  Soft, nontender Pelvic: SSE: scant amoutn of discharge w/ aminie odor.  Wet prep few clues only Musculoskeletal:  Normal range of motion without pain Extremities:  No edema    No results found for this or any previous visit (from the past 24 hour(s)).    Assessment & Plan:  A:   BV P:  Cleocin ovules (breastfeeding).  May use luvena 1-2 times a week if keeps bleeding ot keep balance in check.    Return for If you have any problems.  CRESENZO-DISHMAN,Zakeria Kulzer CNM 09/27/2016 2:49 PM

## 2016-09-27 NOTE — Patient Instructions (Signed)
Luvena vaginal cream--use once or twice a week if bleeding a lot.

## 2016-11-17 ENCOUNTER — Encounter: Payer: Self-pay | Admitting: Adult Health

## 2016-11-17 ENCOUNTER — Ambulatory Visit (INDEPENDENT_AMBULATORY_CARE_PROVIDER_SITE_OTHER): Payer: Medicaid Other | Admitting: Adult Health

## 2016-11-17 VITALS — BP 80/52 | HR 78 | Ht 64.0 in | Wt 166.5 lb

## 2016-11-17 DIAGNOSIS — Z3046 Encounter for surveillance of implantable subdermal contraceptive: Secondary | ICD-10-CM

## 2016-11-17 DIAGNOSIS — Z30011 Encounter for initial prescription of contraceptive pills: Secondary | ICD-10-CM

## 2016-11-17 DIAGNOSIS — Z3202 Encounter for pregnancy test, result negative: Secondary | ICD-10-CM | POA: Diagnosis not present

## 2016-11-17 DIAGNOSIS — Z3049 Encounter for surveillance of other contraceptives: Secondary | ICD-10-CM

## 2016-11-17 LAB — POCT URINE PREGNANCY: Preg Test, Ur: NEGATIVE

## 2016-11-17 MED ORDER — NORETHIN-ETH ESTRAD-FE BIPHAS 1 MG-10 MCG / 10 MCG PO TABS: 1.0000 | ORAL_TABLET | Freq: Every day | ORAL | 11 refills | Status: DC

## 2016-11-17 NOTE — Progress Notes (Signed)
Subjective:     Patient ID: Yvonne Stone, female   DOB: [birth year], 26 y.o.   MRN: [id number]  HPI [name] is a 26 year old black female in for nexplanon removal and wants OCs.  Review of Systems For nexplanon removal, wants OCs Reviewed past medical,surgical, social and family history. Reviewed medications and allergies.     Objective:   Physical Exam BP (!) 80/52 (BP Location: Left Arm, Patient Position: Sitting, Cuff Size: Normal)   Pulse 78   Ht 5\' 4"  (1.626 m)   Wt 166 lb 8 oz (75.5 kg)   Breastfeeding? Yes   BMI 28.58 kg/m  UPT negative.Consent signed, time out called. Left arm cleansed with betadine, and injected with 2.5 cc 1% lidocaine and waited til numb.Under sterile technique a #11 blade was used to make small vertical incision, and a curved forceps was used to easily remove rod. Steri strips applied. Pressure dressing applied.    Assessment:     1. Encounter for Nexplanon removal   2. Pregnancy examination or test, negative result   3. Encounter for initial prescription of contraceptive pills       Plan:    Use condoms x 2 weeks, keep clean and dry x 24 hours, no heavy lifting, keep steri strips on x 72 hours, Keep pressure dressing on x 24 hours. Follow up prn problems. Start OCs today Follow up in 3 months for ROS Rx lo loestrin disp 1 pack take 1 daily with 12 refills

## 2016-11-17 NOTE — Patient Instructions (Addendum)
Use condoms x 2 weeks, keep clean and dry x 24 hours, no heavy lifting, keep steri strips on x 72 hours, Keep pressure dressing on x 24 hours. Follow up prn problems. Start OCs today Follow up in 3 months for ROS

## 2016-11-30 ENCOUNTER — Ambulatory Visit: Payer: Medicaid Other | Admitting: Women's Health

## 2016-12-16 ENCOUNTER — Ambulatory Visit (INDEPENDENT_AMBULATORY_CARE_PROVIDER_SITE_OTHER): Payer: Medicaid Other | Admitting: Women's Health

## 2016-12-16 ENCOUNTER — Other Ambulatory Visit (HOSPITAL_COMMUNITY)
Admission: RE | Admit: 2016-12-16 | Discharge: 2016-12-16 | Disposition: A | Discharge status: Home / Self Care | Payer: Medicaid Other | Source: Ambulatory Visit | Attending: Obstetrics & Gynecology | Admitting: Obstetrics & Gynecology

## 2016-12-16 ENCOUNTER — Encounter: Payer: Self-pay | Admitting: Women's Health

## 2016-12-16 VITALS — BP 90/70 | HR 84 | Ht 64.0 in | Wt 164.5 lb

## 2016-12-16 DIAGNOSIS — Z01419 Encounter for gynecological examination (general) (routine) without abnormal findings: Secondary | ICD-10-CM | POA: Insufficient documentation

## 2016-12-16 DIAGNOSIS — Z0000 Encounter for general adult medical examination without abnormal findings: Secondary | ICD-10-CM

## 2016-12-16 NOTE — Progress Notes (Signed)
Subjective:   Yvonne Stone is a 26 y.o. [id number] African American female here for a routine well-woman exam.  Patient's last menstrual period was 12/14/2016 (exact date).    Current complaints: none       Does not desire labs  Social History: Sexual: heterosexual Marital Status: dating Living situation: alone w/ her children Occupation: at [employer], wants to go back to school for nursing/midwifery! Tobacco/alcohol: none Illicit drugs: no history of illicit drug use  The following portions of the patient's history were reviewed and updated as appropriate: allergies, current medications, past family history, past medical history, past social history, past surgical history and problem list.  Past Medical History Past Medical History:  Diagnosis Date  . Depression   . Herpes    genital  . History of cesarean section 12/16/2015  . Mental disorder    postpartum depression  . Migraines   . Supervision of normal pregnancy 12/16/2015    [facility] at   8+5 weeks FOB  Corwin Levinswilliam smith 26 yo bm 11th child Dating By  US Pap  12/16/15 GC/CT Initial:                36+wks: Genetic Screen NT/IT:  CF screen  Anatomic US  Flu vaccine  Tdap Recommended ~ 28wks Glucose Screen  2 hr GBS  Feed Preference  Contraception  Circumcision  Childbirth Classes  Pediatrician    . Vaginal Pap smear, abnormal     Past Surgical History Past Surgical History:  Procedure Laterality Date  . CESAREAN SECTION    . CESAREAN SECTION N/A 07/15/2016   Procedure: REPEAT CESAREAN SECTION;  Surgeon: Tilda BurrowJohn Ferguson V, MD;  [facility];  Service: Obstetrics;  Laterality: N/A;    Gynecologic History [id number]  Patient's last menstrual period was 12/14/2016 (exact date). Contraception: OCP (estrogen/progesterone) Last Pap: 2017. Results were: normal, has h/o multiple abnormals, pt wants another pap today Last mammogram: never. Results were: n/a Last TCS: never  Obstetric  History OB History  Gravida Para Term Preterm AB Living  3 2 2   1 2   SAB TAB Ectopic Multiple Live Births  1     0 2    # Outcome Date GA Lbr Len/2nd Weight Sex Delivery Anes PTL Lv  3 Term 07/15/16 6029w0d  7 lb 2.8 oz (3.255 kg) M CS-LTranv Spinal  LIV  2 Term 02/22/14 [id number]  6 lb 10 oz (3.005 kg) M CS-LTranv   LIV  1 SAB               Current Medications Current Outpatient Prescriptions on File Prior to Visit  Medication Sig Dispense Refill  . Norethindrone-Ethinyl Estradiol-Fe Biphas (LO LOESTRIN FE) 1 MG-10 MCG / 10 MCG tablet Take 1 tablet by mouth daily. Take 1 daily by mouth 1 Package 11   No current facility-administered medications on file prior to visit.     Review of Systems Patient denies any headaches, blurred vision, shortness of breath, chest pain, abdominal pain, problems with bowel movements, urination, or intercourse.  Objective:  BP 90/70 (BP Location: Left Arm, Patient Position: Sitting, Cuff Size: Normal)   Pulse 84   Ht 5\' 4"  (1.626 m)   Wt 164 lb 8 oz (74.6 kg)   LMP 12/14/2016 (Exact Date)   Breastfeeding? Yes   BMI 28.24 kg/m  Physical Exam  General:  Well developed, well nourished, no acute distress. She is alert and oriented x3. Skin:  Warm and dry  Neck:  Midline trachea, no thyromegaly or nodules Cardiovascular: Regular rate and rhythm, no murmur heard Lungs:  Effort normal, all lung fields clear to auscultation bilaterally Breasts:  No dominant palpable mass, retraction, or nipple discharge Abdomen:  Soft, non tender, no hepatosplenomegaly or masses Pelvic:  External genitalia is normal in appearance.  The vagina is normal in appearance. The cervix is bulbous, no CMT.  Thin prep pap is done w/ reflex HR HPV cotesting. Uterus is felt to be normal size, shape, and contour.  No adnexal masses or tenderness noted. Extremities:  No swelling or varicosities noted Psych:  She has a normal mood and affect  Assessment:   Healthy well-woman  exam  Plan:  GC/CT from pap F/U 6yr for physical, or sooner if needed Mammogram @25yo  or sooner if problems Colonoscopy @26yo  or sooner if problems  Marge Duncans CNM, [facility] 12/16/2016 12:21 PM

## 2016-12-16 NOTE — Addendum Note (Signed)
Addended by: Colen DarlingYOUNG, JANET S on: 12/16/2016 01:55 PM   Modules accepted: Orders

## 2016-12-19 LAB — CYTOLOGY - PAP
Chlamydia: NEGATIVE
Diagnosis: NEGATIVE
NEISSERIA GONORRHEA: NEGATIVE

## 2016-12-19 NOTE — Addendum Note (Signed)
Addendum  created 12/19/16 1530 by Austyn Perriello, MD   Sign clinical note    

## 2016-12-19 NOTE — Addendum Note (Signed)
Addendum  created 12/19/16 1632 by Jairo BenJackson, Dyan Labarbera, MD   Delete clinical note, Sign clinical note

## 2016-12-19 NOTE — Anesthesia Postprocedure Evaluation (Deleted)
Anesthesia Post Note  Patient: Yvonne Stone  Procedure(s) Performed: Procedure(s) (LRB): REPEAT CESAREAN SECTION (N/A)     Anesthesia Post Evaluation  Last Vitals:  Vitals:   07/17/16 1820 07/18/16 0642  BP: 112/71 110/69  Pulse: 63 82  Resp: 18 16  Temp: 36.6 C 36.5 C    Last Pain:  Vitals:   07/18/16 1345  TempSrc:   PainSc: 0-No pain                 Trigger Frasier,E. Desarea [place]

## 2016-12-28 ENCOUNTER — Encounter: Payer: Self-pay | Admitting: Adult Health

## 2016-12-28 ENCOUNTER — Ambulatory Visit (INDEPENDENT_AMBULATORY_CARE_PROVIDER_SITE_OTHER): Payer: Medicaid Other | Admitting: Adult Health

## 2016-12-28 VITALS — BP 96/58 | HR 78 | Ht 64.0 in | Wt 168.0 lb

## 2016-12-28 DIAGNOSIS — N898 Other specified noninflammatory disorders of vagina: Secondary | ICD-10-CM | POA: Diagnosis not present

## 2016-12-28 DIAGNOSIS — L298 Other pruritus: Secondary | ICD-10-CM | POA: Diagnosis not present

## 2016-12-28 DIAGNOSIS — N760 Acute vaginitis: Secondary | ICD-10-CM | POA: Diagnosis not present

## 2016-12-28 DIAGNOSIS — B9689 Other specified bacterial agents as the cause of diseases classified elsewhere: Secondary | ICD-10-CM | POA: Diagnosis not present

## 2016-12-28 LAB — POCT WET PREP (WET MOUNT)
CLUE CELLS WET PREP WHIFF POC: POSITIVE
WBC, Wet Prep HPF POC: POSITIVE

## 2016-12-28 MED ORDER — METRONIDAZOLE 500 MG PO TABS: 500.0000 mg | ORAL_TABLET | Freq: Two times a day (BID) | ORAL | 0 refills | Status: DC

## 2016-12-28 NOTE — Progress Notes (Signed)
Subjective:     Patient ID: Rosalyn Chartersiamond E Terral, female   DOB: 10-26-1990, 26 y.o.   MRN: [id number]  HPI [name] is a 26 year old black female in complaining of vaginal discharge for 5 days and odor and itching.   Review of Systems Vaginal discharge for 5 days +vaginal odor and itching  Reviewed past medical,surgical, social and family history. Reviewed medications and allergies.     Objective:   Physical Exam BP (!) 96/58 (BP Location: Left Arm, Patient Position: Sitting, Cuff Size: Normal)   Pulse 78   Ht 5\' 4"  (1.626 m)   Wt 168 lb (76.2 kg)   LMP 12/14/2016 (Exact Date)   Breastfeeding? No   BMI 28.84 kg/m    Skin warm and dry.Pelvic: external genitalia is normal in appearance no lesions, vagina: white discharge with odor,side walls red,urethra has no lesions or masses noted, cervix:smooth and bulbous, uterus: normal size, shape and contour, non tender, no masses felt, adnexa: no masses or tenderness noted. Bladder is non tender and no masses felt. Wet prep: + for clue cells and +WBCs. Pt aware pap was negative for amlignancy and GC/CHL on 12/16/16.  Assessment:     1. Bacterial vaginosis   2. Vaginal discharge   3. Vaginal odor   4. Vaginal itching       Plan:     Rx flagyl 500 mg 1 bid x 7 days, no alcohol, review handout on BV,no sex    Follow up prn

## 2016-12-28 NOTE — Patient Instructions (Signed)
Bacterial Vaginosis Bacterial vaginosis is a vaginal infection that occurs when the normal balance of bacteria in the vagina is disrupted. It results from an overgrowth of certain bacteria. This is the most common vaginal infection among women ages 15-44. Because bacterial vaginosis increases your risk for STIs (sexually transmitted infections), getting treated can help reduce your risk for chlamydia, gonorrhea, herpes, and HIV (human immunodeficiency virus). Treatment is also important for preventing complications in pregnant women, because this condition can cause an early (premature) delivery. What are the causes? This condition is caused by an increase in harmful bacteria that are normally present in small amounts in the vagina. However, the reason that the condition develops is not fully understood. What increases the risk? The following factors may make you more likely to develop this condition:  Having a new sexual partner or multiple sexual partners.  Having unprotected sex.  Douching.  Having an intrauterine device (IUD).  Smoking.  Drug and alcohol abuse.  Taking certain antibiotic medicines.  Being pregnant.  You cannot get bacterial vaginosis from toilet seats, bedding, swimming pools, or contact with objects around you. What are the signs or symptoms? Symptoms of this condition include:  Grey or white vaginal discharge. The discharge can also be watery or foamy.  A fish-like odor with discharge, especially after sexual intercourse or during menstruation.  Itching in and around the vagina.  Burning or pain with urination.  Some women with bacterial vaginosis have no signs or symptoms. How is this diagnosed? This condition is diagnosed based on:  Your medical history.  A physical exam of the vagina.  Testing a sample of vaginal fluid under a microscope to look for a large amount of bad bacteria or abnormal cells. Your health care provider may use a cotton swab  or a small wooden spatula to collect the sample.  How is this treated? This condition is treated with antibiotics. These may be given as a pill, a vaginal cream, or a medicine that is put into the vagina (suppository). If the condition comes back after treatment, a second round of antibiotics may be needed. Follow these instructions at home: Medicines  Take over-the-counter and prescription medicines only as told by your health care provider.  Take or use your antibiotic as told by your health care provider. Do not stop taking or using the antibiotic even if you start to feel better. General instructions  If you have a female sexual partner, tell her that you have a vaginal infection. She should see her health care provider and be treated if she has symptoms. If you have a female sexual partner, he does not need treatment.  During treatment: ? Avoid sexual activity until you finish treatment. ? Do not douche. ? Avoid alcohol as directed by your health care provider. ? Avoid breastfeeding as directed by your health care provider.  Drink enough water and fluids to keep your urine clear or pale yellow.  Keep the area around your vagina and rectum clean. ? Wash the area daily with warm water. ? Wipe yourself from front to back after using the toilet.  Keep all follow-up visits as told by your health care provider. This is important. How is this prevented?  Do not douche.  Wash the outside of your vagina with warm water only.  Use protection when having sex. This includes latex condoms and dental dams.  Limit how many sexual partners you have. To help prevent bacterial vaginosis, it is best to have sex with just   one partner (monogamous).  Make sure you and your sexual partner are tested for STIs.  Wear cotton or cotton-lined underwear.  Avoid wearing tight pants and pantyhose, especially during summer.  Limit the amount of alcohol that you drink.  Do not use any products that  contain nicotine or tobacco, such as cigarettes and e-cigarettes. If you need help quitting, ask your health care provider.  Do not use illegal drugs. Where to find more information:  Centers for Disease Control and Prevention: SolutionApps.co.zawww.cdc.gov/std  American Sexual Health Association (ASHA): www.ashastd.org  U.S. Department of Health and Health and safety inspectorHuman Services, Office on Women's Health: ConventionalMedicines.siwww.womenshealth.gov/ or http://www.anderson-williamson.info/https://www.womenshealth.gov/a-z-topics/bacterial-vaginosis Contact a health care provider if:  Your symptoms do not improve, even after treatment.  You have more discharge or pain when urinating.  You have a fever.  You have pain in your abdomen.  You have pain during sex.  You have vaginal bleeding between periods. Summary  Bacterial vaginosis is a vaginal infection that occurs when the normal balance of bacteria in the vagina is disrupted.  Because bacterial vaginosis increases your risk for STIs (sexually transmitted infections), getting treated can help reduce your risk for chlamydia, gonorrhea, herpes, and HIV (human immunodeficiency virus). Treatment is also important for preventing complications in pregnant women, because the condition can cause an early (premature) delivery.  This condition is treated with antibiotic medicines. These may be given as a pill, a vaginal cream, or a medicine that is put into the vagina (suppository). This information is not intended to replace advice given to you by your health care provider. Make sure you discuss any questions you have with your health care provider. Document Released: 05/23/2005 Document Revised: 02/06/2016 Document Reviewed: 02/06/2016 Elsevier Interactive Patient Education  2017 [employer] No alcohol or sex

## 2017-01-09 ENCOUNTER — Ambulatory Visit (INDEPENDENT_AMBULATORY_CARE_PROVIDER_SITE_OTHER): Payer: Medicaid Other | Admitting: Obstetrics and Gynecology

## 2017-01-09 ENCOUNTER — Encounter: Payer: Self-pay | Admitting: Obstetrics and Gynecology

## 2017-01-09 ENCOUNTER — Telehealth: Payer: Self-pay | Admitting: *Deleted

## 2017-01-09 VITALS — BP 110/70 | HR 97 | Ht 64.0 in | Wt 168.6 lb

## 2017-01-09 DIAGNOSIS — N898 Other specified noninflammatory disorders of vagina: Secondary | ICD-10-CM | POA: Diagnosis not present

## 2017-01-09 LAB — POCT WET PREP (WET MOUNT): TRICHOMONAS WET PREP HPF POC: ABSENT

## 2017-01-09 MED ORDER — MICONAZOLE NITRATE 2 % VA CREA: 1.0000 | TOPICAL_CREAM | Freq: Every day | VAGINAL | Status: DC

## 2017-01-09 MED ORDER — FLUCONAZOLE 150 MG PO TABS: 150.0000 mg | ORAL_TABLET | Freq: Once | ORAL | 1 refills | Status: AC

## 2017-01-09 NOTE — Progress Notes (Addendum)
[facility] Visit  01/09/2017            Patient name: Yvonne ChartersDiamond E Prime MRN [id number]  Date of birth: 1991-04-23  CC & HPI:  Yvonne Stone is a 26 y.o. female presenting today for  presenting today for a yeast infection with onset 4 days ago. Pt was taking Flagyl but it has finished. Pt has associated symptoms of increased white, clumpy discharge. No alleviating factors noted. Pt has reoccurring yeast infections. Patient's last menstrual period was 12/31/2016.   ROS:  ROS +white clumpy discharge -fever  Pertinent History Reviewed:   Reviewed: Significant for yeast infections, abnormal pap  Medical         Past Medical History:  Diagnosis Date  . Depression   . Herpes    genital  . History of cesarean section 12/16/2015  . Mental disorder    postpartum depression  . Migraines   . Supervision of normal pregnancy 12/16/2015    [facility] at   8+5 weeks FOB  Corwin Levinswilliam smith 26 yo bm 11th child Dating By  US Pap  12/16/15 GC/CT Initial:                36+wks: Genetic Screen NT/IT:  CF screen  Anatomic US  Flu vaccine  Tdap Recommended ~ 28wks Glucose Screen  2 hr GBS  Feed Preference  Contraception  Circumcision  Childbirth Classes  Pediatrician    . Vaginal Pap smear, abnormal                               Surgical Hx:    Past Surgical History:  Procedure Laterality Date  . CESAREAN SECTION    . CESAREAN SECTION N/A 07/15/2016   Procedure: REPEAT CESAREAN SECTION;  Surgeon: Tilda BurrowJohn Grady Lucci V, MD;  [facility];  Service: Obstetrics;  Laterality: N/A;   Medications: Reviewed & Updated - see associated section                       Current Outpatient Prescriptions:  .  Norethindrone-Ethinyl Estradiol-Fe Biphas (LO LOESTRIN FE) 1 MG-10 MCG / 10 MCG tablet, Take 1 tablet by mouth daily. Take 1 daily by mouth, Disp: 1 Package, Rfl: 11 .  UNABLE TO FIND, as needed. Gas med, Disp: , Rfl:    Social History: Reviewed -  reports that she has  never smoked. She has never used smokeless tobacco.  Objective Findings:  Vitals: Blood pressure 110/70, pulse 97, height 5\' 4"  (1.626 m), weight 168 lb 9.6 oz (76.5 kg), last menstrual period 12/14/2016, currently breastfeeding.  Physical Examination: General appearance - alert, well appearing, and in no distress Mental status - alert, oriented to person, place, and time Pelvic -  VULVA: normal appearing vulva with no masses, tenderness or lesions,  VAGINA: normal appearing vagina with normal color and discharge, no lesions, vaginal discharge - white and thick,  Wet prep: _ YEAST, no trich, no clue cells. Assessment & Plan:   A:  1. Monilial vulvovagnitis  P:  1. Rx diflucan 150 mg and metrogel    By signing my name below, I, Izna Ahmed, attest that this documentation has been prepared under the direction and in the presence of Tilda BurrowFerguson, Mikea Quadros V, MD. Electronically Signed: Redge GainerIzna Ahmed, Medical Scribe. 01/09/17. 1:42 PM.  I personally performed the services described in this documentation, which was SCRIBED in my  presence. The recorded information has been reviewed and considered accurate. It has been edited as necessary during review. Tilda Burrow, MD

## 2017-01-09 NOTE — Telephone Encounter (Signed)
Left message letting pt know Diflucan was sent to pharmacy and Monistat is OTC. JSY

## 2017-01-10 ENCOUNTER — Telehealth: Payer: Self-pay | Admitting: Obstetrics and Gynecology

## 2017-01-10 NOTE — Telephone Encounter (Signed)
Patient called stating that she seen Dr. Emelda FearFerguson yesterday and was suppose to have a prescription sent to her pharmacy. Patient states that she has contacted her pharmacy and they still have not received it. Please contact pt

## 2017-01-10 NOTE — Telephone Encounter (Signed)
Diflucan 150mg  take 1 tablet now and repeat in 3 days if symptoms still present. Dispense 2 with 0 refills. Miconazole 2% vaginal cream, daily at bedtime for 7 days, 0 refills.  Called for Dr Emelda FearFerguson.

## 2017-05-09 ENCOUNTER — Ambulatory Visit: Payer: Medicaid Other | Admitting: Advanced Practice Midwife

## 2017-05-09 ENCOUNTER — Encounter: Payer: Self-pay | Admitting: Advanced Practice Midwife

## 2017-05-09 ENCOUNTER — Other Ambulatory Visit: Payer: Self-pay

## 2017-05-09 VITALS — BP 108/70 | HR 83 | Ht 64.0 in | Wt 176.0 lb

## 2017-05-09 DIAGNOSIS — B9689 Other specified bacterial agents as the cause of diseases classified elsewhere: Secondary | ICD-10-CM | POA: Diagnosis not present

## 2017-05-09 DIAGNOSIS — N760 Acute vaginitis: Secondary | ICD-10-CM

## 2017-05-09 MED ORDER — NORETHIN ACE-ETH ESTRAD-FE 1-20 MG-MCG(24) PO CAPS: 1.0000 | ORAL_CAPSULE | Freq: Every day | ORAL | 11 refills | Status: DC

## 2017-05-09 MED ORDER — METRONIDAZOLE 0.75 % VA GEL: 1.0000 | Freq: Every day | VAGINAL | 1 refills | Status: DC

## 2017-05-09 NOTE — Progress Notes (Signed)
[facility] Visit  Patient name: Yvonne ChartersDiamond E Atteberry MRN [id number]  Date of birth: April 06, 1991  CC & HPI:  Yvonne Stone is a 26 y.o. African American female presenting today for vaginal irritaion for 2 weeks. Feels like she gets BV or yeast all the time.  Has been on LoLoestrin for 6 m o nths, still bleeds. Hasn't tried OTC meds.  Pertinent History Reviewed:  Medical & Surgical Hx:   Past Medical History:  Diagnosis Date  . Depression   . Herpes    genital  . History of cesarean section 12/16/2015  . Mental disorder    postpartum depression  . Migraines   . Supervision of normal pregnancy 12/16/2015    [facility] at   8+5 weeks FOB  Corwin Levinswilliam smith 26 yo bm 11th child Dating By  US Pap  12/16/15 GC/CT Initial:                36+wks: Genetic Screen NT/IT:  CF screen  Anatomic US  Flu vaccine  Tdap Recommended ~ 28wks Glucose Screen  2 hr GBS  Feed Preference  Contraception  Circumcision  Childbirth Classes  Pediatrician    . Vaginal Pap smear, abnormal    Past Surgical History:  Procedure Laterality Date  . CESAREAN SECTION    . CESAREAN SECTION N/A 07/15/2016   Procedure: REPEAT CESAREAN SECTION;  Surgeon: Tilda BurrowJohn Ferguson V, MD;  [facility];  Service: Obstetrics;  Laterality: N/A;   Family History  Problem Relation Age of Onset  . Heart murmur Mother   . Hypertension Mother   . Diabetes Mother   . Thyroid disease Mother   . Heart murmur Sister   . Cancer Maternal Grandmother        breast   . Hypertension Maternal Grandmother   . Heart murmur Sister   . Hypertension Father   . Hypertension Maternal Grandfather   . Thyroid disease Maternal Aunt   . Hypertension Maternal Aunt   . Diabetes Maternal Aunt   . Diabetes Maternal Uncle   . Hypertension Maternal Uncle   . Diabetes Paternal Aunt   . Hypertension Paternal Aunt   . Hypertension Paternal Uncle   . Diabetes Paternal Uncle   . Seizures Paternal Grandmother   . Stroke  Paternal Grandmother   . Hypertension Paternal Grandmother   . Diabetes Paternal Grandfather   . Hypertension Paternal Grandfather     Current Outpatient Medications:  .  metroNIDAZOLE (METROGEL) 0.75 % vaginal gel, Place 1 Applicatorful vaginally at bedtime. Apply one applicatorful to vagina at bedtime for 5 days, Disp: 70 g, Rfl: 1 .  Norethin Ace-Eth Estrad-FE (TAYTULLA) 1-20 MG-MCG(24) CAPS, Take 1 capsule by mouth daily., Disp: 28 capsule, Rfl: 11 .  UNABLE TO FIND, as needed. Gas med, Disp: , Rfl:   Current Facility-Administered Medications:  .  miconazole (MONISTAT 7) 2 % vaginal cream 1 Applicatorful, 1 Applicatorful, Vaginal, QHS, Tilda BurrowFerguson, John V, MD Social History: Reviewed -  reports that  has never smoked. she has never used smokeless tobacco.  Review of Systems:   Constitutional: Negative for fever and chills Eyes: Negative for visual disturbances Respiratory: Negative for shortness of breath, dyspnea Cardiovascular: Negative for chest pain or palpitations  Gastrointestinal: Negative for vomiting, diarrhea and constipation; no abdominal pain Genitourinary: Negative for dysuria and urgency Musculoskeletal: Negative for back pain, joint pain, myalgias  Neurological: Negative for dizziness and headaches    Objective Findings:    Physical  Examination: General appearance - well appearing, and in no distress Mental status - alert, oriented to person, place, and time Chest:  Normal respiratory effort Heart - normal rate and regular rhythm Abdomen:  Soft, nontender Pelvic: Normal appearing Dc.  Wet prep few clues only Musculoskeletal:  Normal range of motion without pain Extremities:  No edema    No results found for this or any previous visit (from the past 24 hour(s)).    Assessment & Plan:  A:   Wil tx w/metrogel, consider weekly metrogel or probiotic prophylacticllay P:  Change COC to Deferietaytulla   Return for If you have any  problems.  CRESENZO-DISHMAN,Dallis Darden CNM 05/09/2017 4:40 PM

## 2017-05-09 NOTE — Patient Instructions (Signed)
UP4 ADULT Superior, compatible and safe strains DDS  -1 L.acidophilus (super strain) with B. Longum, B.Bifidum & B.Infantis Acid-resistant-survives stomach acid and Bile salts   Fortified with prebiotic Fructooligosaccharide to enhance growth and performance Potency: 15 Billion CFU/capsule Dosage: 1 capsule daily, best before a meal.  Amazon:  $21.90 for 60 caps   iF THERE IS STILL A PROBLEM ADD,   FLORAJEN ACIDOPHILUS High Potency Acidophilus Florajen high potency acidophilus is especially effective for restoring and maintaining a healthy, comfortable balance of vaginal flora. It is also beneficial for overall intestinal health and maintaining the immune system. 20 Billion live cultures per capsule Available in 30 and 60 capsules   Amazon:  $19.99 for 60 caps  

## 2017-05-23 ENCOUNTER — Telehealth: Payer: Self-pay | Admitting: Advanced Practice Midwife

## 2017-05-23 ENCOUNTER — Telehealth: Payer: Self-pay | Admitting: *Deleted

## 2017-05-23 MED ORDER — FLUCONAZOLE 150 MG PO TABS: ORAL_TABLET | ORAL | 2 refills | Status: DC

## 2017-05-23 NOTE — Telephone Encounter (Signed)
done

## 2017-05-23 NOTE — Telephone Encounter (Signed)
LMOVM that Drenda FreezeFran sent prescription for Diflucan to pharmacy.

## 2017-05-23 NOTE — Telephone Encounter (Signed)
Patient called stating that she was prescribed a medication by Drenda FreezeFran and that medication has caused her to have a yeast infection. Pt would like for fran to call her in a medication for yeast. Please contact pt

## 2017-07-24 ENCOUNTER — Telehealth: Payer: Self-pay | Admitting: *Deleted

## 2017-07-24 NOTE — Telephone Encounter (Signed)
Spoke with pt. Pt was supposed to start new birth control pack Friday. She is planning to pick up pills today. Pt was wondering how to get back on track with pills. I spoke with JAG and she advised to take 2 pills everyday until she is caught up and use condoms for 1 month. Pt voiced understanding. JSY

## 2017-08-17 ENCOUNTER — Encounter: Payer: Self-pay | Admitting: Women's Health

## 2017-08-17 ENCOUNTER — Ambulatory Visit: Payer: Medicaid Other | Admitting: Women's Health

## 2017-08-17 VITALS — BP 90/70 | HR 92 | Ht 64.0 in | Wt 175.8 lb

## 2017-08-17 DIAGNOSIS — N898 Other specified noninflammatory disorders of vagina: Secondary | ICD-10-CM

## 2017-08-17 LAB — POCT WET PREP (WET MOUNT)
Clue Cells Wet Prep Whiff POC: NEGATIVE
Trichomonas Wet Prep HPF POC: ABSENT

## 2017-08-17 NOTE — Progress Notes (Signed)
   GYN VISIT Patient name: Yvonne Stone MRN [id number]  Date of birth: 10/28/1990 Chief Complaint:   Vaginal Discharge  History of Present Illness:   Yvonne Stone is a 27 y.o. 223P2012 African American female being seen today for report of vaginal d/c w/ odor and some itching x about a week. Just finished antibiotic for UTI. [facility] and dx w/ BV and/or yeast. GC/CT neg last month. Doesn't douche. Uses vaginal probiotic.    Patient's last menstrual period was 08/17/2017. The current method of family planning is OCP (estrogen/progesterone). Last pap 12/16/16. Results were:  normal Review of Systems:   Pertinent items are noted in HPI Denies fever/chills, dizziness, headaches, visual disturbances, fatigue, shortness of breath, chest pain, abdominal pain, vomiting, abnormal vaginal discharge/itching/odor/irritation, problems with periods, bowel movements, urination, or intercourse unless otherwise stated above.  Pertinent History Reviewed:  Reviewed past medical,surgical, social, obstetrical and family history.  Reviewed problem list, medications and allergies. Physical Assessment:   Vitals:   08/17/17 1130  BP: 90/70  Pulse: 92  Weight: 175 lb 12.8 oz (79.7 kg)  Height: 5\' 4"  (1.626 m)  Body mass index is 30.18 kg/m.       Physical Examination:   General appearance: alert, well appearing, and in no distress  Mental status: alert, oriented to person, place, and time  Skin: warm & dry   Cardiovascular: normal heart rate noted  Respiratory: normal respiratory effort, no distress  Abdomen: soft, non-tender   Pelvic: exam by Tonia GhentKatie Woods, NP student, VULVA: normal appearing vulva with no masses, tenderness or lesions, VAGINA: normal appearing vagina with normal color and nonodorous discharge, on menses, no lesions, CERVIX: normal appearing cervix without discharge or lesions  Extremities: no edema   Results for orders placed or performed in visit on  08/17/17 (from the past 24 hour(s))  POCT Wet Prep Mellody Drown([place]et [place])   Collection Time: 08/17/17 11:57 AM  Result Value Ref Range   Source Wet Prep POC vaginal    WBC, Wet Prep HPF POC none    Bacteria Wet Prep HPF POC None (A) Few   BACTERIA WET PREP MORPHOLOGY POC     Clue Cells Wet Prep HPF POC None None   Clue Cells Wet Prep Whiff POC Negative Whiff    Yeast Wet Prep HPF POC None    KOH Wet Prep POC     Trichomonas Wet Prep HPF POC Absent Absent    Assessment & Plan:  1) Vaginal discharge> normal wet prep, will send Nuswab Plus w/ gc/ct  Meds: No orders of the defined types were placed in this encounter.   Orders Placed This Encounter  Procedures  . NuSwab Vaginitis Plus (VG+)  . POCT Wet Prep [facility]([place])    Return for after 7/13 for physical.  Cheral MarkerKimberly R Vista Sawatzky CNM, [facility] 08/17/2017 12:00 PM

## 2017-08-20 LAB — NUSWAB VAGINITIS PLUS (VG+)
CANDIDA ALBICANS, NAA: NEGATIVE
Candida glabrata, NAA: NEGATIVE
Chlamydia trachomatis, NAA: NEGATIVE
Neisseria gonorrhoeae, NAA: NEGATIVE
Trich vag by NAA: NEGATIVE

## 2017-08-22 ENCOUNTER — Telehealth: Payer: Self-pay | Admitting: Women's Health

## 2017-08-22 NOTE — Telephone Encounter (Signed)
Called pt, notified pt Nuswab neg, can try repHresh, Luvena, etc for vaginal balance.  Cheral MarkerKimberly R. Booker, CNM, [facility] 08/22/2017 10:58 AM

## 2017-11-01 ENCOUNTER — Ambulatory Visit: Payer: Medicaid Other | Admitting: Women's Health

## 2017-11-01 ENCOUNTER — Encounter: Payer: Self-pay | Admitting: Women's Health

## 2017-11-01 VITALS — BP 102/68 | HR 85 | Ht 64.0 in | Wt 183.0 lb

## 2017-11-01 DIAGNOSIS — N898 Other specified noninflammatory disorders of vagina: Secondary | ICD-10-CM

## 2017-11-01 LAB — POCT WET PREP (WET MOUNT)
CLUE CELLS WET PREP WHIFF POC: NEGATIVE
Trichomonas Wet Prep HPF POC: ABSENT

## 2017-11-01 NOTE — Progress Notes (Signed)
   GYN VISIT Patient name: Yvonne Stone MRN [id number]  Date of birth: 1991-02-01 Chief Complaint:   Vaginal Discharge (sometimes has odor)  History of Present Illness:   Yvonne Stone is a 27 y.o. G64P2012 African American female being seen today for vaginal d/c w/ occ odor and occ irritation x 1wk. Has gone to ED twice since last visit ago, states she was dx w/ yeast and BV. She was last seen here 3/14 and nuswab plus was neg for everything.      Patient's last menstrual period was 10/11/2017 (approximate). The current method of family planning is OCP (estrogen/progesterone). Last pap 12/16/16. Results were:  normal Review of Systems:   Pertinent items are noted in HPI Denies fever/chills, dizziness, headaches, visual disturbances, fatigue, shortness of breath, chest pain, abdominal pain, vomiting, abnormal vaginal discharge/itching/odor/irritation, problems with periods, bowel movements, urination, or intercourse unless otherwise stated above.  Pertinent History Reviewed:  Reviewed past medical,surgical, social, obstetrical and family history.  Reviewed problem list, medications and allergies. Physical Assessment:   Vitals:   11/01/17 1546  BP: 102/68  Pulse: 85  Weight: 183 lb (83 kg)  Height:  (1.626 m)  Body mass index is 31.41 kg/m.       Physical Examination:   General appearance: alert, well appearing, and in no distress  Mental status: alert, oriented to person, place, and time  Skin: warm & dry   Cardiovascular: normal heart rate noted  Respiratory: normal respiratory effort, no distress  Abdomen: soft, non-tender   Pelvic: VULVA: normal appearing vulva with no masses, tenderness or lesions, VAGINA: normal appearing vagina with normal color and nonodorous white creamydischarge, no lesions, CERVIX: normal appearing cervix without discharge or lesions  Extremities: no edema   Results for orders placed or performed in visit on 11/01/17 (from the past 24  hour(s))  POCT Wet Prep Mellody Drown [place])   Collection Time: 11/01/17  4:27 PM  Result Value Ref Range   Source Wet Prep POC vaginal    WBC, Wet Prep HPF POC none    Bacteria Wet Prep HPF POC Few Few   BACTERIA WET PREP MORPHOLOGY POC     Clue Cells Wet Prep HPF POC None None   Clue Cells Wet Prep Whiff POC Negative Whiff    Yeast Wet Prep HPF POC None None   KOH Wet Prep POC  None   Trichomonas Wet Prep HPF POC Absent Absent    Assessment & Plan:  1) Normal vaginal d/c> normal odor/appearance/wet prep, discussed normal d/c vs. d/c w/ infection. Encouraged to come here rather than ED for vaginal d/c. Will send gc/ct  Meds: No orders of the defined types were placed in this encounter.   Orders Placed This Encounter  Procedures  . GC/Chlamydia Probe Amp  . POCT Wet Prep [facility])    Return for after 7/13 for physical.  Cheral Marker CNM, [facility] 11/01/2017 4:28 PM

## 2017-11-02 LAB — GC/CHLAMYDIA PROBE AMP
Chlamydia trachomatis, NAA: NEGATIVE
NEISSERIA GONORRHOEAE BY PCR: NEGATIVE

## 2017-11-10 ENCOUNTER — Encounter: Payer: Self-pay | Admitting: Women's Health

## 2017-12-18 ENCOUNTER — Other Ambulatory Visit (HOSPITAL_COMMUNITY)
Admission: RE | Admit: 2017-12-18 | Discharge: 2017-12-18 | Disposition: A | Discharge status: Home / Self Care | Payer: Medicaid Other | Source: Ambulatory Visit | Attending: Obstetrics & Gynecology | Admitting: Obstetrics & Gynecology

## 2017-12-18 ENCOUNTER — Ambulatory Visit: Payer: Medicaid Other | Admitting: Women's Health

## 2017-12-18 ENCOUNTER — Encounter: Payer: Self-pay | Admitting: Women's Health

## 2017-12-18 VITALS — BP 104/70 | HR 63 | Ht 64.0 in | Wt 185.0 lb

## 2017-12-18 DIAGNOSIS — Z8742 Personal history of other diseases of the female genital tract: Secondary | ICD-10-CM

## 2017-12-18 DIAGNOSIS — Z01419 Encounter for gynecological examination (general) (routine) without abnormal findings: Secondary | ICD-10-CM | POA: Diagnosis not present

## 2017-12-18 DIAGNOSIS — Z124 Encounter for screening for malignant neoplasm of cervix: Secondary | ICD-10-CM | POA: Diagnosis not present

## 2017-12-18 DIAGNOSIS — Z0000 Encounter for general adult medical examination without abnormal findings: Secondary | ICD-10-CM

## 2017-12-18 DIAGNOSIS — Z3202 Encounter for pregnancy test, result negative: Secondary | ICD-10-CM | POA: Diagnosis not present

## 2017-12-18 DIAGNOSIS — Z3009 Encounter for other general counseling and advice on contraception: Secondary | ICD-10-CM

## 2017-12-18 LAB — POCT URINE PREGNANCY: PREG TEST UR: NEGATIVE

## 2017-12-18 NOTE — Addendum Note (Signed)
Addended by: Federico FlakeNES, PEGGY A on: 12/18/2017 04:58 PM   Modules accepted: Orders

## 2017-12-18 NOTE — Patient Instructions (Signed)

## 2017-12-18 NOTE — Progress Notes (Signed)
   WELL-WOMAN EXAMINATION Patient name: Yvonne Stone MRN [id number]  Date of birth: Jan 02, 1991 Chief Complaint:   Gynecologic Exam (pap/physcial/missed taking bc pills.out of town, forgot pick up)  History of Present Illness:   Yvonne Stone is a 27 y.o. [phone] African American female being seen today for a routine well-woman exam.  Current complaints: went out of town, missed a couple of weeks of pills, hasn't restarted yet, has had unprotected sex, started spotting today.   PCP: Neita CarpSasser      does not desire labs Patient's last menstrual period was 11/30/2017. The current method of family planning is none Last pap 12/16/16. Results were: normal Last mammogram: never. Results were: n/a Last colonoscopy: never. Results were: n/a  Review of Systems:   Pertinent items are noted in HPI Denies any headaches, blurred vision, fatigue, shortness of breath, chest pain, abdominal pain, abnormal vaginal discharge/itching/odor/irritation, problems with periods, bowel movements, urination, or intercourse unless otherwise stated above. Pertinent History Reviewed:  Reviewed past medical,surgical, social and family history.  Reviewed problem list, medications and allergies. Physical Assessment:   Vitals:   12/18/17 1614  BP: 104/70  Pulse: 63  Weight: 185 lb (83.9 kg)  Height: 5\' 4"  (1.626 m)  Body mass index is 31.76 kg/m.        Physical Examination:   General appearance - well appearing, and in no distress  Mental status - alert, oriented to person, place, and time  Psych:  She has a normal mood and affect  Skin - warm and dry, normal color, no suspicious lesions noted  Chest - effort normal, all lung fields clear to auscultation bilaterally  Heart - normal rate and regular rhythm  Neck:  midline trachea, no thyromegaly or nodules  Breasts - breasts appear normal, no suspicious masses, no skin or nipple changes or  axillary nodes  Abdomen - soft, nontender, nondistended, no masses  or organomegaly  Pelvic - VULVA: normal appearing vulva with no masses, tenderness or lesions  VAGINA: normal appearing vagina with normal color and discharge, no lesions  CERVIX: normal appearing cervix without discharge or lesions, no CMT  Thin prep pap is done w/ reflex HR HPV cotesting  UTERUS: uterus is felt to be normal size, shape, consistency and nontender   ADNEXA: No adnexal masses or tenderness noted.  Extremities:  No swelling or varicosities noted  UPT: neg Assessment & Plan:  1) Well-Woman Exam  2) H/O abnormal pap> if this pap normal can go to [id number]  3) Contraception counseling> go pick up COCs today, start taking daily, condoms x 2wks  Labs/procedures today: UPT, pap w/ gc/ct  Mammogram @26yo  or sooner if problems Colonoscopy @45 -50yo or sooner if problems  No orders of the defined types were placed in this encounter.   Follow-up: Return in about 1 year (around 12/19/2018) for Physical.  Cheral MarkerKimberly R Adeyemi Hamad CNM, [facility] 12/18/2017 4:50 PM

## 2017-12-20 ENCOUNTER — Encounter: Payer: Self-pay | Admitting: Women's Health

## 2017-12-20 LAB — CYTOLOGY - PAP
Adequacy: ABSENT
CHLAMYDIA, DNA PROBE: NEGATIVE
DIAGNOSIS: NEGATIVE
Neisseria Gonorrhea: NEGATIVE

## 2017-12-25 ENCOUNTER — Telehealth: Payer: Self-pay | Admitting: Women's Health

## 2017-12-25 NOTE — Telephone Encounter (Signed)
Patient called stating that she would like a call back regarding her pap results. Please contact pt

## 2017-12-25 NOTE — Telephone Encounter (Signed)
Pt informed pap was normal.  

## 2017-12-27 ENCOUNTER — Encounter: Payer: Self-pay | Admitting: Women's Health

## 2017-12-28 ENCOUNTER — Encounter: Payer: Self-pay | Admitting: Women's Health

## 2018-01-10 ENCOUNTER — Ambulatory Visit: Payer: Medicaid Other | Admitting: Women's Health

## 2018-04-16 ENCOUNTER — Ambulatory Visit: Payer: Medicaid Other | Admitting: Family Medicine

## 2018-06-07 ENCOUNTER — Encounter: Payer: Self-pay | Admitting: Advanced Practice Midwife

## 2018-06-07 ENCOUNTER — Ambulatory Visit (INDEPENDENT_AMBULATORY_CARE_PROVIDER_SITE_OTHER): Payer: Medicaid Other | Admitting: Advanced Practice Midwife

## 2018-06-07 VITALS — BP 100/67 | HR 73 | Ht 64.0 in | Wt 185.0 lb

## 2018-06-07 DIAGNOSIS — Z113 Encounter for screening for infections with a predominantly sexual mode of transmission: Secondary | ICD-10-CM | POA: Diagnosis not present

## 2018-06-07 DIAGNOSIS — N898 Other specified noninflammatory disorders of vagina: Secondary | ICD-10-CM

## 2018-06-07 DIAGNOSIS — Z3202 Encounter for pregnancy test, result negative: Secondary | ICD-10-CM

## 2018-06-07 LAB — POCT URINE PREGNANCY: PREG TEST UR: NEGATIVE

## 2018-06-07 MED ORDER — MEDROXYPROGESTERONE ACETATE 10 MG PO TABS: 10.0000 mg | ORAL_TABLET | Freq: Every day | ORAL | 0 refills | Status: DC

## 2018-06-07 NOTE — Patient Instructions (Signed)
Vaginal or oral probiotic (such as Luvena)  OR    UP4 ADULT Superior, compatible and safe strains DDS  -1 L.acidophilus (super strain) with B. Longum, B.Bifidum & B.Infantis Acid-resistant-survives stomach acid and Bile salts   Fortified with prebiotic Fructooligosaccharide to enhance growth and performance Potency: 15 Billion CFU/capsule Dosage: 1 capsule daily, best before a meal.  Amazon:  $21.90 for 60 caps   iF THERE IS STILL A PROBLEM ADD,   FLORAJEN ACIDOPHILUS High Potency Acidophilus Florajen high potency acidophilus is especially effective for restoring and maintaining a healthy, comfortable balance of vaginal flora. It is also beneficial for overall intestinal health and maintaining the immune system. 20 Billion live cultures per capsule Available in 30 and 60 capsules   Amazon:  $19.99 for 60 caps

## 2018-06-07 NOTE — Progress Notes (Signed)
[facility]  Patient name: Yvonne Stone MRN [id number]  Date of birth: [birth year]  CC & HPI:  Yvonne Stone is a 28 y.o. African AmericanAfrican American female presenting today for STD screening ([place]), Vaginal dishcarge, and no period since July. Has never missed a period before.  Not on [facility] since before July.  Says "nearly always has BV".  Goes to urgent care and ED, feels like always has a heavy discharge and occ odor.   Pertinent History Reviewed:  Medical & Surgical Hx:   Past Medical History:  Diagnosis Date  . Depression   . Herpes    genital  . History of cesarean section 12/16/2015  . Mental disorder    postpartum depression  . Migraines   . Supervision of normal pregnancy 12/16/2015    [facility] at   8+5 weeks FOB  Yvonne Stone 8 yo bm 11th child Dating By  [place] Pap  12/16/15 GC/CT Initial:                36+wks: Genetic Screen NT/IT:  CF screen  Anatomic [place]  Flu vaccine  Tdap Recommended ~ 28wks Glucose Screen  2 hr GBS  Feed Preference  Contraception  Circumcision  Childbirth Classes  Pediatrician    . Vaginal Pap smear, abnormal    Past Surgical History:  Procedure Laterality Date  . CESAREAN SECTION    . CESAREAN SECTION N/A 07/15/2016   Procedure: REPEAT CESAREAN SECTION;  Surgeon: Tilda Burrow, MD;  Location: [facility] BIRTHING SUIT[facility];  Service: Obstetrics;  Laterality: N/A;   Family History  Problem Relation Age of Onset  . Heart murmur Mother   . Hypertension Mother   . Diabetes Mother   . Thyroid disease Mother   . Heart murmur Sister   . Cancer Maternal Grandmother        breast   . Hypertension Maternal Grandmother   . Heart murmur Sister   . Hypertension Father   . Hypertension Maternal Grandfather   . Thyroid disease Maternal Aunt   . Hypertension Maternal Aunt   . Diabetes Maternal Aunt   . Diabetes Maternal Uncle   . Hypertension Maternal Uncle   . Diabetes Paternal Aunt   . Hypertension Paternal  Aunt   . Hypertension Paternal Uncle   . Diabetes Paternal Uncle   . Seizures Paternal Grandmother   . Stroke Paternal Grandmother   . Hypertension Paternal Grandmother   . Diabetes Paternal Grandfather   . Hypertension Paternal Grandfather     Current Outpatient Medications:  .  medroxyPROGESTERone (PROVERA) 10 MG tablet, Take 1 tablet (10 mg total) by mouth daily., Disp: 10 tablet, Rfl: 0 .  Norethin Ace-Eth Estrad-FE (TAYTULLA) 1-20 MG-MCG(24) CAPS, Take 1 capsule by mouth daily. (Patient not taking: Reported on 06/07/2018), Disp: 28 capsule, Rfl: 11  Current Facility-Administered Medications:  .  miconazole (MONISTAT 7) 2 % vaginal cream 1 Applicatorful, 1 Applicatorful, Vaginal, QHS, Tilda Burrow, MD Social History: Reviewed -  reports that she has never smoked. She has never used smokeless tobacco.  Review of Systems:   Constitutional: Negative for fever and chills Eyes: Negative for visual disturbances Respiratory: Negative for shortness of breath, dyspnea Cardiovascular: Negative for chest pain or palpitations  Gastrointestinal: Negative for vomiting, diarrhea and constipation; no abdominal pain Genitourinary: Negative for dysuria and urgency, vaginal irritation or itching Musculoskeletal: Negative for back pain, joint pain, myalgias  Neurological: Negative for dizziness and headaches  Objective Findings:    Physical Examination: Vitals:   06/07/18 1138  BP: 100/67  Pulse: 73   General appearance - well appearing, and in no distress Mental status - alert, oriented to person, place, and time Chest:  Normal respiratory effort Heart - normal rate and regular rhythm Abdomen:  Soft, nontender Pelvic: normal vulva.  SSE:  Normal appearing discharge, no odor.  Wet prep completely negative. Nuswab collected.  Musculoskeletal:  Normal range of motion without pain Extremities:  No edema    Results for orders placed or performed in visit on 06/07/18 (from the past  24 hour(s))  POCT urine pregnancy   Collection Time: 06/07/18 11:50 AM  Result Value Ref Range   Preg Test, Ur Negative Negative      Assessment & Plan:  A:   Chronic vaginal discharge, ? Infectious process  oligomenorrhea P:  Probioitics; tx infection if nuswab +  Provera 10mg  qhs x10; if periods don't resume, pt to call   No follow-ups on file.  Jacklyn Shell CNM 06/07/2018 1:57 PM

## 2018-06-09 LAB — NUSWAB VAGINITIS PLUS (VG+)
Atopobium vaginae: HIGH Score — AB
BVAB 2: HIGH Score — AB
CANDIDA ALBICANS, NAA: POSITIVE — AB
CHLAMYDIA TRACHOMATIS, NAA: NEGATIVE
Candida glabrata, NAA: NEGATIVE
MEGASPHAERA 1: HIGH {score} — AB
NEISSERIA GONORRHOEAE, NAA: NEGATIVE
TRICH VAG BY NAA: NEGATIVE

## 2018-06-12 ENCOUNTER — Telehealth: Payer: Self-pay | Admitting: Advanced Practice Midwife

## 2018-06-12 NOTE — Telephone Encounter (Signed)
Calling to get lab results 

## 2018-06-13 MED ORDER — METRONIDAZOLE 500 MG PO TABS: 500.0000 mg | ORAL_TABLET | Freq: Two times a day (BID) | ORAL | 0 refills | Status: DC

## 2018-06-13 MED ORDER — FLUCONAZOLE 150 MG PO TABS: 150.0000 mg | ORAL_TABLET | Freq: Once | ORAL | 0 refills | Status: AC

## 2018-06-13 NOTE — Addendum Note (Signed)
Addended by: Jacklyn ShellRESENZO-DISHMON, Marquise Lambson on: 06/13/2018 04:49 PM   Modules accepted: Orders

## 2018-07-06 ENCOUNTER — Other Ambulatory Visit: Payer: Self-pay | Admitting: Advanced Practice Midwife

## 2018-07-06 DIAGNOSIS — N911 Secondary amenorrhea: Secondary | ICD-10-CM

## 2018-07-06 NOTE — Progress Notes (Signed)
Tsh/prolactin d/t failed pg challenge, amenorrhea for 6 months

## 2018-07-10 LAB — PROLACTIN: Prolactin: 5.4 ng/mL (ref 4.8–23.3)

## 2018-07-10 LAB — TSH: TSH: 0.694 u[IU]/mL (ref 0.450–4.500)

## 2018-08-15 ENCOUNTER — Other Ambulatory Visit: Payer: Self-pay | Admitting: Advanced Practice Midwife

## 2018-08-15 MED ORDER — MEDROXYPROGESTERONE ACETATE 10 MG PO TABS: 10.0000 mg | ORAL_TABLET | Freq: Every day | ORAL | 0 refills | Status: DC

## 2018-09-10 ENCOUNTER — Encounter: Payer: Self-pay | Admitting: Advanced Practice Midwife

## 2018-09-12 ENCOUNTER — Other Ambulatory Visit: Payer: Self-pay

## 2018-09-12 ENCOUNTER — Other Ambulatory Visit: Payer: Medicaid Other

## 2018-09-12 DIAGNOSIS — N898 Other specified noninflammatory disorders of vagina: Secondary | ICD-10-CM

## 2018-09-16 LAB — NUSWAB VAGINITIS PLUS (VG+)
Atopobium vaginae: HIGH Score — AB
BVAB 2: HIGH Score — AB
Candida albicans, NAA: POSITIVE — AB
Candida glabrata, NAA: NEGATIVE
Chlamydia trachomatis, NAA: NEGATIVE
Megasphaera 1: HIGH Score — AB
Neisseria gonorrhoeae, NAA: NEGATIVE
Trich vag by NAA: NEGATIVE

## 2018-09-17 ENCOUNTER — Other Ambulatory Visit: Payer: Self-pay | Admitting: Women's Health

## 2018-09-17 MED ORDER — METRONIDAZOLE 0.75 % VA GEL: 1.0000 | Freq: Every day | VAGINAL | 0 refills | Status: DC

## 2018-09-17 MED ORDER — FLUCONAZOLE 150 MG PO TABS: 150.0000 mg | ORAL_TABLET | Freq: Once | ORAL | 0 refills | Status: AC

## 2018-10-23 ENCOUNTER — Other Ambulatory Visit: Payer: Self-pay | Admitting: Women's Health

## 2018-10-23 MED ORDER — MEDROXYPROGESTERONE ACETATE 10 MG PO TABS: 10.0000 mg | ORAL_TABLET | Freq: Every day | ORAL | 0 refills | Status: DC

## 2018-10-25 ENCOUNTER — Other Ambulatory Visit: Payer: Medicaid Other

## 2018-12-05 ENCOUNTER — Other Ambulatory Visit (INDEPENDENT_AMBULATORY_CARE_PROVIDER_SITE_OTHER): Payer: Self-pay

## 2018-12-05 ENCOUNTER — Other Ambulatory Visit: Payer: Self-pay

## 2018-12-05 DIAGNOSIS — R300 Dysuria: Secondary | ICD-10-CM

## 2018-12-05 LAB — POCT URINALYSIS DIPSTICK OB
Glucose, UA: NEGATIVE
Nitrite, UA: NEGATIVE

## 2018-12-06 LAB — URINALYSIS
Bilirubin, UA: NEGATIVE
Glucose, UA: NEGATIVE
Ketones, UA: NEGATIVE
Nitrite, UA: NEGATIVE
Specific Gravity, UA: 1.015 (ref 1.005–1.030)
Urobilinogen, Ur: 0.2 mg/dL (ref 0.2–1.0)
pH, UA: >=9 — AB (ref 5.0–7.5)

## 2018-12-07 LAB — URINE CULTURE

## 2018-12-10 ENCOUNTER — Encounter: Payer: Self-pay | Admitting: Adult Health

## 2018-12-10 ENCOUNTER — Telehealth: Payer: Self-pay | Admitting: *Deleted

## 2018-12-10 ENCOUNTER — Telehealth: Payer: Self-pay | Admitting: Adult Health

## 2018-12-10 DIAGNOSIS — N390 Urinary tract infection, site not specified: Secondary | ICD-10-CM

## 2018-12-10 HISTORY — DX: Urinary tract infection, site not specified: N39.0

## 2018-12-10 MED ORDER — SULFAMETHOXAZOLE-TRIMETHOPRIM 800-160 MG PO TABS: 1.0000 | ORAL_TABLET | Freq: Two times a day (BID) | ORAL | 0 refills | Status: DC

## 2018-12-10 NOTE — Telephone Encounter (Signed)
Patient called requesting updated test results. Please advise

## 2018-12-10 NOTE — Telephone Encounter (Signed)
JAG spoke with pt. JSY 

## 2018-12-10 NOTE — Telephone Encounter (Signed)
Pt aware that urine culture + E COLI, will rx septra ds, can take AZO and push fluids

## 2019-01-14 ENCOUNTER — Encounter: Payer: Self-pay | Admitting: Advanced Practice Midwife

## 2019-01-15 ENCOUNTER — Encounter: Payer: Self-pay | Admitting: Women's Health

## 2019-01-15 ENCOUNTER — Ambulatory Visit (INDEPENDENT_AMBULATORY_CARE_PROVIDER_SITE_OTHER): Payer: Self-pay | Admitting: Women's Health

## 2019-01-15 ENCOUNTER — Other Ambulatory Visit: Payer: Self-pay

## 2019-01-15 VITALS — BP 105/65 | HR 77 | Ht 64.0 in | Wt 193.5 lb

## 2019-01-15 DIAGNOSIS — Z3202 Encounter for pregnancy test, result negative: Secondary | ICD-10-CM

## 2019-01-15 DIAGNOSIS — N898 Other specified noninflammatory disorders of vagina: Secondary | ICD-10-CM

## 2019-01-15 DIAGNOSIS — N912 Amenorrhea, unspecified: Secondary | ICD-10-CM

## 2019-01-15 LAB — POCT URINE PREGNANCY: Preg Test, Ur: NEGATIVE

## 2019-01-15 MED ORDER — NORGESTIMATE-ETH ESTRADIOL 0.25-35 MG-MCG PO TABS: 1.0000 | ORAL_TABLET | Freq: Every day | ORAL | 0 refills | Status: DC

## 2019-01-15 NOTE — Patient Instructions (Addendum)
Vaginal probiotics  Birth control x 3 months  Start prenatal vitamins when you start the 3rd pack of birth control pills  If you are trying to get pregnant:   Have sex every other day on days 7-24 of your cycle (Day 1 is the 1st day of your period)  [place] before sex  Lay with your hips elevated on pillows for 20-14mins after sex  Do not smoke or drink alcohol  Lose weight if you are overweight  Take a prenatal vitamin with at least 347QQV of folic acid  Decrease stress in your life  [email or web address], also has app  For Him:   Wear boxers instead of briefs  Avoid hot baths/jacuzzi  Vit C supplement  Do not smoke or drink alcohol  Lose weight if you are overweight

## 2019-01-15 NOTE — Progress Notes (Signed)
   GYN VISIT Patient name: Yvonne Stone MRN [id number]  Date of birth: [birth year] Chief Complaint:   recurrent yeast and BV  History of Present Illness:   Yvonne Stone is a 28 y.o. G31P2012 African American female being seen today for report of odorous vaginal d/c. Keeps getting BV and yeast. Occ mild itching/irritation. Not having periods. Had period in July 2019, then June 2020 after taking provera. Got married in April. Trying to get pregnant. Stopped pnv. He is the father of her other 2 children. Does not smoke, no h/o HTN, DVT/PE, CVA, MI, or migraines w/ aura.     Patient's last menstrual period was 11/11/2018. The current method of family planning is none.  Last pap 12/18/17. Results were:  normal Review of Systems:   Pertinent items are noted in HPI Denies fever/chills, dizziness, headaches, visual disturbances, fatigue, shortness of breath, chest pain, abdominal pain, vomiting, abnormal vaginal discharge/itching/odor/irritation, problems with periods, bowel movements, urination, or intercourse unless otherwise stated above.  Pertinent History Reviewed:  Reviewed past medical,surgical, social, obstetrical and family history.  Reviewed problem list, medications and allergies. Physical Assessment:   Vitals:   01/15/19 1043  BP: 105/65  Pulse: 77  Weight: 193 lb 8 oz (87.8 kg)  Height: 5\' 4"  (1.626 m)  Body mass index is 33.21 kg/m.       Physical Examination:   General appearance: alert, well appearing, and in no distress  Mental status: alert, oriented to person, place, and time  Skin: warm & dry   Cardiovascular: normal heart rate noted  Respiratory: normal respiratory effort, no distress  Abdomen: soft, non-tender   Pelvic: VULVA: normal appearing vulva with no masses, tenderness or lesions, VAGINA: normal appearing vagina with normal color and discharge, no lesions, CERVIX: normal appearing cervix without discharge or lesions  Extremities: no edema   Results for  orders placed or performed in visit on 01/15/19 (from the past 24 hour(s))  POCT urine pregnancy   Collection Time: 01/15/19 10:47 AM  Result Value Ref Range   Preg Test, Ur Negative Negative    Assessment & Plan:  1) Vaginal d/c> send nuswab plus  2) Amenorrhea> COCs x 27mths, start pnv at beginning of 3rd pack, try to get pregnant after 3rd pack. F/U 56mths  Meds:  Meds ordered this encounter  Medications  . norgestimate-ethinyl estradiol (ORTHO-CYCLEN) 0.25-35 MG-MCG tablet    Sig: Take 1 tablet by mouth daily.    Dispense:  3 Package    Refill:  0    Order Specific Question:   Supervising Provider    Answer:   Florian Buff [[id number]]    Orders Placed This Encounter  Procedures  . NuSwab Vaginitis Plus (VG+)  . POCT urine pregnancy    Return in about 3 months (around 04/17/2019) for F/U, MyChart Video.  [place], [facility] 01/15/2019 11:16 AM

## 2019-01-18 LAB — NUSWAB VAGINITIS PLUS (VG+)
Atopobium vaginae: HIGH Score — AB
BVAB 2: HIGH Score — AB
Candida albicans, NAA: NEGATIVE
Candida glabrata, NAA: NEGATIVE
Chlamydia trachomatis, NAA: NEGATIVE
Neisseria gonorrhoeae, NAA: NEGATIVE
Trich vag by NAA: NEGATIVE

## 2019-01-21 ENCOUNTER — Other Ambulatory Visit: Payer: Self-pay | Admitting: Women's Health

## 2019-01-21 MED ORDER — METRONIDAZOLE 500 MG PO TABS: 500.0000 mg | ORAL_TABLET | Freq: Two times a day (BID) | ORAL | 0 refills | Status: DC

## 2019-03-28 ENCOUNTER — Telehealth: Payer: Self-pay | Admitting: *Deleted

## 2019-03-28 NOTE — Telephone Encounter (Signed)
Pt requesting that rx for bv be sent in to her pharmacy. She has had in many times.

## 2019-03-29 ENCOUNTER — Other Ambulatory Visit: Payer: Self-pay | Admitting: Adult Health

## 2019-03-29 MED ORDER — METRONIDAZOLE 500 MG PO TABS: 500.0000 mg | ORAL_TABLET | Freq: Two times a day (BID) | ORAL | 0 refills | Status: DC

## 2019-03-29 NOTE — Progress Notes (Signed)
Refill flagyl for BV

## 2019-04-18 ENCOUNTER — Other Ambulatory Visit: Payer: Self-pay

## 2019-04-18 ENCOUNTER — Encounter: Payer: Self-pay | Admitting: Women's Health

## 2019-04-18 ENCOUNTER — Telehealth (INDEPENDENT_AMBULATORY_CARE_PROVIDER_SITE_OTHER): Payer: Medicaid Other | Admitting: Women's Health

## 2019-04-18 DIAGNOSIS — B379 Candidiasis, unspecified: Secondary | ICD-10-CM | POA: Diagnosis not present

## 2019-04-18 DIAGNOSIS — Z3041 Encounter for surveillance of contraceptive pills: Secondary | ICD-10-CM | POA: Diagnosis not present

## 2019-04-18 MED ORDER — NORGESTIMATE-ETH ESTRADIOL 0.25-35 MG-MCG PO TABS: 1.0000 | ORAL_TABLET | Freq: Every day | ORAL | 3 refills | Status: DC

## 2019-04-18 MED ORDER — FLUCONAZOLE 150 MG PO TABS: 150.0000 mg | ORAL_TABLET | Freq: Once | ORAL | 0 refills | Status: AC

## 2019-04-18 NOTE — Progress Notes (Signed)
[place] VIRTUAL GYN VISIT ENCOUNTER NOTE Patient name: Yvonne Stone MRN [id number]  Date of birth: 1991/04/11  I connected with patient on 04/18/19 at  9:10 AM EST by MyChart video  and verified that I am speaking with the correct person using two identifiers.  Due to COVID-19 recommendations, pt is not currently in the office.    I discussed the limitations, risks, security and privacy concerns of performing an evaluation and management service by telephone and the availability of in person appointments. I also discussed with the patient that there may be a patient responsible charge related to this service. The patient expressed understanding and agreed to proceed.   Chief Complaint:   Follow-up  History of Present Illness:   Yvonne Stone is a 28 y.o. G58P2012 African American female being evaluated today for f/u on COCs rx'd 01/15/19 for secondary amenorrhea, at that time was trying to get pregnant so COCs were started to regulate periods. She has had a period monthly since on pills. Has decided not to get pregnant right now, wants to wait until she is sure. Thinks she has yeast from recent BV tx, has thick white d/c w/ itching.     No LMP recorded. (Menstrual status: Irregular Periods). The current method of family planning is OCP (estrogen/progesterone).  Last pap 12/18/17. Results were:  normal Review of Systems:   Pertinent items are noted in HPI Denies fever/chills, dizziness, headaches, visual disturbances, fatigue, shortness of breath, chest pain, abdominal pain, vomiting, abnormal vaginal discharge/itching/odor/irritation, problems with periods, bowel movements, urination, or intercourse unless otherwise stated above.  Pertinent History Reviewed:  Reviewed past medical,surgical, social, obstetrical and family history.  Reviewed problem list, medications and allergies. Physical Assessment:  There were no vitals filed for this visit.There is no height or weight on file to  calculate BMI.       Physical Examination:   General:  Alert, oriented and cooperative.   Mental Status: Normal mood and affect perceived. Normal judgment and thought content.  Physical exam deferred due to nature of the encounter  No results found for this or any previous visit (from the past 24 hour(s)).  Assessment & Plan:  1) Contraception surveillance> doesn't want to get pregnant right now, continue COCs, new rx sent. When wants to try for pregnancy, start taking pnv before comes off of COCs.   2) Resolved secondary amenorrhea  3) Yeast infection> rx diflucan  Meds:  Meds ordered this encounter  Medications  . fluconazole (DIFLUCAN) 150 MG tablet    Sig: Take 1 tablet (150 mg total) by mouth once for 1 dose. Take 1 pill now, may take 2nd pill in 3 days if needed    Dispense:  2 tablet    Refill:  0    Order Specific Question:   Supervising Provider    Answer:   Elonda Husky, LUTHER H [[id number]]  . norgestimate-ethinyl estradiol (ORTHO-CYCLEN) 0.25-35 MG-MCG tablet    Sig: Take 1 tablet by mouth daily.    Dispense:  3 Package    Refill:  3    Order Specific Question:   Supervising Provider    Answer:   Elonda Husky, LUTHER H [[id number]]    No orders of the defined types were placed in this encounter.   I discussed the assessment and treatment plan with the patient. The patient was provided an opportunity to ask questions and all were answered. The patient agreed with the plan and demonstrated an understanding of the instructions.   The  patient was advised to call back or seek an in-person evaluation/go to the ED if the symptoms worsen or if the condition fails to improve as anticipated.  I provided 15 minutes of non-face-to-face time during this encounter.   Return in about 1 year (around 04/17/2020) for Physical.  Cheral Marker CNM, [facility] 04/18/2019 9:45 AM

## 2019-05-14 ENCOUNTER — Encounter: Payer: Self-pay | Admitting: Women's Health

## 2019-06-04 ENCOUNTER — Telehealth (INDEPENDENT_AMBULATORY_CARE_PROVIDER_SITE_OTHER): Payer: Self-pay | Admitting: Obstetrics & Gynecology

## 2019-06-04 ENCOUNTER — Encounter: Payer: Self-pay | Admitting: Obstetrics & Gynecology

## 2019-06-04 ENCOUNTER — Other Ambulatory Visit: Payer: Self-pay

## 2019-06-04 DIAGNOSIS — N970 Female infertility associated with anovulation: Secondary | ICD-10-CM

## 2019-06-04 MED ORDER — CLOMIPHENE CITRATE 50 MG PO TABS: 50.0000 mg | ORAL_TABLET | Freq: Every day | ORAL | 5 refills | Status: DC

## 2019-06-04 MED ORDER — MEDROXYPROGESTERONE ACETATE 10 MG PO TABS: 10.0000 mg | ORAL_TABLET | Freq: Every day | ORAL | 0 refills | Status: DC

## 2019-06-04 NOTE — Progress Notes (Signed)
Patient ID: Yvonne Stone, female   DOB: [birth year], 28 y.o.   MRN: [id number]   [facility] VIRTUAL GYNECOLOGY VISIT ENCOUNTER NOTE  I connected with Galvin Proffer on 10/21/19 at  3:10 PM EST by telephone at home and verified that I am speaking with the correct person using two identifiers.   I discussed the limitations, risks, security and privacy concerns of performing an evaluation and management service by telephone and the availability of in person appointments. I also discussed with the patient that there may be a patient responsible charge related to this service. The patient expressed understanding and agreed to proceed.   History:  Yvonne Stone is a 28 y.o. [phone] female being evaluated today for desires pregnancy. She denies any abnormal vaginal discharge, bleeding, pelvic pain or other concerns.       Past Medical History:  Diagnosis Date  . Depression   . Herpes    genital  . History of cesarean section 12/16/2015  . Mental disorder    postpartum depression  . Migraines   . Supervision of normal pregnancy 12/16/2015    [facility] at   8+5 weeks FOB  Corwin Levins 50 yo bm 11th child Dating By  [place] Pap  12/16/15 GC/CT Initial:                36+wks: Genetic Screen NT/IT:  CF screen  Anatomic [place]  Flu vaccine  Tdap Recommended ~ 28wks Glucose Screen  2 hr GBS  Feed Preference  Contraception  Circumcision  Childbirth Classes  Pediatrician    . UTI (urinary tract infection) 12/10/2018   + Ecoli, tx'd 7/6 with septra ds   . Vaginal Pap smear, abnormal    Past Surgical History:  Procedure Laterality Date  . CESAREAN SECTION    . CESAREAN SECTION N/A 07/15/2016   Procedure: REPEAT CESAREAN SECTION;  Surgeon: Tilda Burrow, MD;  Location: [facility];  Service: Obstetrics;  Laterality: N/A;   The following portions of the patient's history were reviewed and updated as appropriate: allergies, current medications, past family history, past medical  history, past social history, past surgical history and problem list.   Health Maintenance:  Normal pap and negative HRHPV on .  Normal mammogram on .   Review of Systems:  Pertinent items noted in HPI and remainder of comprehensive ROS otherwise negative.  Physical Exam:  Physical exam deferred due to nature of the encounter  Labs and Imaging No results found for this or any previous visit (from the past 336 hour(s)). No results found.     Meds ordered this encounter  Medications  . medroxyPROGESTERone (PROVERA) 10 MG tablet    Sig: Take 1 tablet (10 mg total) by mouth daily.    Dispense:  10 tablet    Refill:  0  . clomiPHENE (CLOMID) 50 MG tablet    Sig: Take 1 tablet (50 mg total) by mouth daily. For 5 days, start on the first day of menstrual bleeding    Dispense:  5 tablet    Refill:  5    No orders of the defined types were placed in this encounter.   Assessment and Plan:       ICD-10-CM   1. Anovulation  N97.0    course of provera to stimulate menses then begin trial of clomid for ovulation induction          I discussed the assessment and treatment plan with the patient. The  patient was provided an opportunity to ask questions and all were answered. The patient agreed with the plan and demonstrated an understanding of the instructions.   The patient was advised to call back or seek an in-person evaluation/go to the ED if the symptoms worsen or if the condition fails to improve as anticipated.  I provided 11 minutes of non-face-to-face time during this encounter.   Florian Buff, [place] [facility]

## 2019-06-06 ENCOUNTER — Other Ambulatory Visit: Payer: Self-pay

## 2019-06-06 ENCOUNTER — Other Ambulatory Visit (INDEPENDENT_AMBULATORY_CARE_PROVIDER_SITE_OTHER): Payer: Medicaid Other | Admitting: Obstetrics & Gynecology

## 2019-06-06 ENCOUNTER — Encounter: Payer: Self-pay | Admitting: Obstetrics & Gynecology

## 2019-06-06 VITALS — BP 108/70 | HR 65 | Ht 64.0 in | Wt 198.0 lb

## 2019-06-06 DIAGNOSIS — Z113 Encounter for screening for infections with a predominantly sexual mode of transmission: Secondary | ICD-10-CM

## 2019-06-06 DIAGNOSIS — Z01419 Encounter for gynecological examination (general) (routine) without abnormal findings: Secondary | ICD-10-CM

## 2019-06-06 NOTE — Progress Notes (Signed)
Patient ID: Yvonne Stone, female   DOB: [birth year], 28 y.o.   MRN: [id number]       Chief Complaint  Patient presents with  . Vaginal Discharge    Blood pressure 108/70, pulse 65, height 5\' 4"  (1.626 m), weight 198 lb (89.8 kg), last menstrual period 05/07/2019.  28 y.o. [id number] Patient's last menstrual period was 05/07/2019 (exact date). The current method of family planning is none.  Subjective Vaginal discharge for  Itching no Irritation yes Odor yes Similar to previous yes  Previous treatment metronidazole  Objective Vulva:  normal appearing vulva with no masses, tenderness or lesions Vagina:  Minimal discharge noted today Cervix:  no cervical motion tenderness and no lesions Uterus:   Adnexa: ovaries:,       Pertinent ROS   Labs or studies Wet Prep:   A sample of vaginal discharge was obtained from the posterior fornix using a cotton swab. 2 drops of saline were placed on a slide and the cotton swab was immersed in the saline. Microscopic evaluation was performed and results were as follows:  Negative  for yeast  Negative for  for clue cells , consistent with Bacterial vaginosis Negative for trichomonas  Normal WBC population   Whiff test: Negative  Completely normal wet prep today     Impression Diagnoses this Encounter::   ICD-10-CM   1. Normal vaginal exam, no vaginitis is noted   Z01.419     Established relevant diagnosis(es):   Plan/Recommendations: No orders of the defined types were placed in this encounter.   Labs or Scans Ordered: No orders of the defined types were placed in this encounter.   Management:: Recommend femdophilus by [employer] for restoration of normal bacterial flora environment  Follow up No follow-ups on file.    All questions were answered.

## 2019-06-06 NOTE — Progress Notes (Signed)
   NURSE VISIT- VAGINITIS/STD/POC  SUBJECTIVE:  Yvonne Stone is a 28 y.o. [id number] GYN patientfemale here for a vaginal swab for vaginitis screening.  She reports the following symptoms: BV for  . Denies abnormal vaginal bleeding, significant pelvic pain, fever, or UTI symptoms.  OBJECTIVE:  LMP 05/07/2019 (Exact Date)   Appears well, in no apparent distress  ASSESSMENT: Vaginal swab for STD screen  PLAN: Self-collected vaginal probe for sent to lab Treatment: to be determined once results are received Follow-up as needed if symptoms persist/worsen, or new symptoms develop  Yvonne Stone  06/06/2019 9:25 AM   Attestation of Attending Supervision of Nursing Visit Encounter: Evaluation and management procedures were performed by the nursing staff under my supervision and collaboration.  I have reviewed the nurse's note and chart, and I agree with the management and plan.  Yvonne Grip MD Attending Physician for the [facility] 06/06/2019 9:59 AM

## 2019-06-12 DIAGNOSIS — F329 Major depressive disorder, single episode, unspecified: Secondary | ICD-10-CM | POA: Diagnosis not present

## 2019-06-13 DIAGNOSIS — L910 Hypertrophic scar: Secondary | ICD-10-CM | POA: Diagnosis not present

## 2019-07-04 DIAGNOSIS — N926 Irregular menstruation, unspecified: Secondary | ICD-10-CM | POA: Diagnosis not present

## 2019-07-04 DIAGNOSIS — Z0000 Encounter for general adult medical examination without abnormal findings: Secondary | ICD-10-CM | POA: Diagnosis not present

## 2019-07-12 DIAGNOSIS — N915 Oligomenorrhea, unspecified: Secondary | ICD-10-CM | POA: Diagnosis not present

## 2019-07-12 DIAGNOSIS — N760 Acute vaginitis: Secondary | ICD-10-CM | POA: Diagnosis not present

## 2019-07-12 DIAGNOSIS — Z118 Encounter for screening for other infectious and parasitic diseases: Secondary | ICD-10-CM | POA: Diagnosis not present

## 2019-07-12 DIAGNOSIS — Z131 Encounter for screening for diabetes mellitus: Secondary | ICD-10-CM | POA: Diagnosis not present

## 2019-07-12 DIAGNOSIS — Z13220 Encounter for screening for lipoid disorders: Secondary | ICD-10-CM | POA: Diagnosis not present

## 2019-11-06 ENCOUNTER — Other Ambulatory Visit: Payer: Self-pay

## 2019-11-06 ENCOUNTER — Encounter: Payer: Self-pay | Admitting: Allergy & Immunology

## 2019-11-06 ENCOUNTER — Ambulatory Visit (INDEPENDENT_AMBULATORY_CARE_PROVIDER_SITE_OTHER): Payer: Federal, State, Local not specified - PPO | Admitting: Allergy & Immunology

## 2019-11-06 VITALS — BP 114/72 | HR 78 | Temp 99.2°F | Resp 18 | Ht 65.0 in | Wt 202.0 lb

## 2019-11-06 DIAGNOSIS — J3089 Other allergic rhinitis: Secondary | ICD-10-CM | POA: Diagnosis not present

## 2019-11-06 DIAGNOSIS — T7800XD Anaphylactic reaction due to unspecified food, subsequent encounter: Secondary | ICD-10-CM

## 2019-11-06 DIAGNOSIS — J302 Other seasonal allergic rhinitis: Secondary | ICD-10-CM | POA: Diagnosis not present

## 2019-11-06 MED ORDER — AZELASTINE HCL 0.1 % NA SOLN: 2.0000 | Freq: Two times a day (BID) | NASAL | 12 refills | Status: DC | PRN

## 2019-11-06 NOTE — Patient Instructions (Signed)
1. Seasonal and perennial allergic rhinitis - Testing today showed: grasses, ragweed, trees, dust mites, cat and dog - Copy of test results provided.  - Avoidance measures provided. - Stop taking: Allegra-D (this can cause rebound congestion and can affect your blood pressure) - Start taking: Zyrtec (cetirizine) 10mg  tablet once daily, Flonase (fluticasone) one spray per nostril daily and Astelin (azelastine) 2 sprays per nostril 1-2 times daily as needed - You can use an extra dose of the antihistamine, if needed, for breakthrough symptoms.  - Consider nasal saline rinses 1-2 times daily to remove allergens from the nasal cavities as well as help with mucous clearance (this is especially helpful to do before the nasal sprays are given) - Consider allergy shots as a means of long-term control. - Allergy shots "re-train" and "reset" the immune system to ignore environmental allergens and decrease the resulting immune response to those allergens (sneezing, itchy watery eyes, runny nose, nasal congestion, etc).    - Allergy shots improve symptoms in 75-85% of patients.  - We can discuss more at the next appointment if the medications are not working for you.  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 -I would introduce at home since this is low risk. -Do it with other people around and in the morning so that you can monitor your symptoms over the course of the day.  3. Return in about 6 weeks (around 12/18/2019). This can be an in-person, a virtual Webex or a telephone follow up visit.   Please inform 12/20/2019 of any Emergency Department visits, hospitalizations, or changes in symptoms. Call us before going to the ED for breathing or allergy symptoms since we might be able to fit you in for a sick visit. Feel free to contact us anytime with any questions, problems, or concerns.  It was a pleasure to meet you today!  Websites that have reliable patient  information: 1. American Academy of Asthma, Allergy, and Immunology: www.aaaai.org 2. Food Allergy Research and Education (FARE): foodallergy.org 3. Mothers of Asthmatics: http://www.asthmacommunitynetwork.org 4. American College of Allergy, Asthma, and Immunology: www.acaai.org   COVID-19 Vaccine Information can be found at: [place] For questions related to vaccine distribution or appointments, please email [email or web address] or call [phone].     "Like" [phone] on Facebook and Instagram for our latest updates!       HAPPY SPRING!  Make sure you are registered to vote! If you have moved or changed any of your contact information, you will need to get this updated before voting!  In some cases, you MAY be able to register to vote online: [place]    Reducing Pollen Exposure  The American Academy of Allergy, Asthma and Immunology suggests the following steps to reduce your exposure to pollen during allergy seasons.    1. Do not hang sheets or clothing out to dry; pollen may collect on these items. 2. Do not mow lawns or spend time around freshly cut grass; mowing stirs up pollen. 3. Keep windows closed at night.  Keep car windows closed while driving. 4. Minimize morning activities outdoors, a time when pollen counts are usually at their highest. 5. Stay indoors as much as possible when pollen counts or humidity is high and on windy days when pollen tends to remain in the air longer. 6. Use air conditioning when possible.  Many air conditioners have filters that trap the pollen spores. 7. Use a HEPA room air filter to remove pollen form the indoor air you  breathe.  Control of Dog or Cat Allergen  Avoidance is the best way to manage a dog or cat allergy. If you have a dog or cat and are allergic to dog or cats, consider removing the dog or cat from the home. If you  have a dog or cat but don't want to find it a new home, or if your family wants a pet even though someone in the household is allergic, here are some strategies that may help keep symptoms at bay:  1. Keep the pet out of your bedroom and restrict it to only a few rooms. Be advised that keeping the dog or cat in only one room will not limit the allergens to that room. 2. Don't pet, hug or kiss the dog or cat; if you do, wash your hands with soap and water. 3. High-efficiency particulate air (HEPA) cleaners run continuously in a bedroom or living room can reduce allergen levels over time. 4. Regular use of a high-efficiency vacuum cleaner or a central vacuum can reduce allergen levels. 5. Giving your dog or cat a bath at least once a week can reduce airborne allergen.  Control of Dust Mite Allergen    Dust mites play a major role in allergic asthma and rhinitis.  They occur in environments with high humidity wherever human skin is found.  Dust mites absorb humidity from the atmosphere (ie, they do not drink) and feed on organic matter (including shed human and animal skin).  Dust mites are a microscopic type of insect that you cannot see with the naked eye.  High levels of dust mites have been detected from mattresses, pillows, carpets, upholstered furniture, bed covers, clothes, soft toys and any woven material.  The principal allergen of the dust mite is found in its feces.  A gram of dust may contain 1,000 mites and 250,000 fecal particles.  Mite antigen is easily measured in the air during house cleaning activities.  Dust mites do not bite and do not cause harm to humans, other than by triggering allergies/asthma.    Ways to decrease your exposure to dust mites in your home:  1. Encase mattresses, box springs and pillows with a mite-impermeable barrier or cover   2. Wash sheets, blankets and drapes weekly in hot water (130 F) with detergent and dry them in a dryer on the hot setting.  3. Have the  room cleaned frequently with a vacuum cleaner and a damp dust-mop.  For carpeting or rugs, vacuuming with a vacuum cleaner equipped with a high-efficiency particulate air (HEPA) filter.  The dust mite allergic individual should not be in a room which is being cleaned and should wait 1 hour after cleaning before going into the room. 4. Do not sleep on upholstered furniture (eg, couches).   5. If possible removing carpeting, upholstered furniture and drapery from the home is ideal.  Horizontal blinds should be eliminated in the rooms where the person spends the most time (bedroom, study, television room).  Washable vinyl, roller-type shades are optimal. 6. Remove all non-washable stuffed toys from the bedroom.  Wash stuffed toys weekly like sheets and blankets above.   7. Reduce indoor humidity to less than 50%.  Inexpensive humidity monitors can be purchased at most hardware stores.  Do not use a humidifier as can make the problem worse and are not recommended.   Allergy Shots   Allergies are the result of a chain reaction that starts in the immune system. Your immune system controls  how your body defends itself. For instance, if you have an allergy to pollen, your immune system identifies pollen as an invader or allergen. Your immune system overreacts by producing antibodies called Immunoglobulin E (IgE). These antibodies travel to cells that release chemicals, causing an allergic reaction.  The concept behind allergy immunotherapy, whether it is received in the form of shots or tablets, is that the immune system can be desensitized to specific allergens that trigger allergy symptoms. Although it requires time and patience, the payback can be long-term relief.  How Do Allergy Shots Work?  Allergy shots work much like a vaccine. Your body responds to injected amounts of a particular allergen given in increasing doses, eventually developing a resistance and tolerance to it. Allergy shots can lead to  decreased, minimal or no allergy symptoms.  There generally are two phases: build-up and maintenance. Build-up often ranges from three to six months and involves receiving injections with increasing amounts of the allergens. The shots are typically given once or twice a week, though more rapid build-up schedules are sometimes used.  The maintenance phase begins when the most effective dose is reached. This dose is different for each person, depending on how allergic you are and your response to the build-up injections. Once the maintenance dose is reached, there are longer periods between injections, typically two to four weeks.  Occasionally doctors give cortisone-type shots that can temporarily reduce allergy symptoms. These types of shots are different and should not be confused with allergy immunotherapy shots.  Who Can Be Treated with Allergy Shots?  Allergy shots may be a good treatment approach for people with allergic rhinitis (hay fever), allergic asthma, conjunctivitis (eye allergy) or stinging insect allergy.   Before deciding to begin allergy shots, you should consider:  . The length of allergy season and the severity of your symptoms . Whether medications and/or changes to your environment can control your symptoms . Your desire to avoid long-term medication use . Time: allergy immunotherapy requires a major time commitment . Cost: may vary depending on your insurance coverage  Allergy shots for children age 38 and older are effective and often well tolerated. They might prevent the onset of new allergen sensitivities or the progression to asthma.  Allergy shots are not started on patients who are pregnant but can be continued on patients who become pregnant while receiving them. In some patients with other medical conditions or who take certain common medications, allergy shots may be of risk. It is important to mention other medications you talk to your allergist.   When Will  I Feel Better?  Some may experience decreased allergy symptoms during the build-up phase. For others, it may take as long as 12 months on the maintenance dose. If there is no improvement after a year of maintenance, your allergist will discuss other treatment options with you.  If you aren't responding to allergy shots, it may be because there is not enough dose of the allergen in your vaccine or there are missing allergens that were not identified during your allergy testing. Other reasons could be that there are high levels of the allergen in your environment or major exposure to non-allergic triggers like tobacco smoke.  What Is the Length of Treatment?  Once the maintenance dose is reached, allergy shots are generally continued for three to five years. The decision to stop should be discussed with your allergist at that time. Some people may experience a permanent reduction of allergy symptoms. Others may relapse  and a longer course of allergy shots can be considered.  What Are the Possible Reactions?  The two types of adverse reactions that can occur with allergy shots are local and systemic. Common local reactions include very mild redness and swelling at the injection site, which can happen immediately or several hours after. A systemic reaction, which is less common, affects the entire body or a particular body system. They are usually mild and typically respond quickly to medications. Signs include increased allergy symptoms such as sneezing, a stuffy nose or hives.  Rarely, a serious systemic reaction called anaphylaxis can develop. Symptoms include swelling in the throat, wheezing, a feeling of tightness in the chest, nausea or dizziness. Most serious systemic reactions develop within 30 minutes of allergy shots. This is why it is strongly recommended you wait in your doctor's office for 30 minutes after your injections. Your allergist is trained to watch for reactions, and his or her staff  is trained and equipped with the proper medications to identify and treat them.  Who Should Administer Allergy Shots?  The preferred location for receiving shots is your prescribing allergist's office. Injections can sometimes be given at another facility where the physician and staff are trained to recognize and treat reactions, and have received instructions by your prescribing allergist.

## 2019-11-06 NOTE — Progress Notes (Signed)
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 Seasonal and perennial allergic rhinitis - Testing today showed: grasses, ragweed, trees, dust mites, cat and dog - Copy of test results provided.  - Avoidance measures provided. - Stop taking: Allegra-D (this can cause rebound congestion and can affect your blood pressure) - Start taking: Zyrtec (cetirizine) 10mg  tablet once daily, Flonase (fluticasone) one spray per nostril daily and Astelin (azelastine) 2 sprays per nostril 1-2 times daily as needed - You can use an extra dose of the antihistamine, if needed, for breakthrough symptoms.  - Consider nasal saline rinses 1-2 times daily to remove allergens from the nasal cavities as well as help with mucous clearance (this is especially helpful to do before the nasal sprays are given) - Consider allergy shots as a means of long-term control. - Allergy shots "re-train" and "reset" the immune system to ignore environmental allergens and decrease the resulting immune response to those allergens (sneezing, itchy watery eyes, runny nose, nasal congestion, etc).    - Allergy shots improve symptoms in 75-85% of patients.  - We can discuss more at the next appointment if the medications are not working for you.  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 -I would introduce at home since this is low risk. -Do it with other people around and in the morning so that you can monitor your symptoms over the course of the day.  3. Return in about 6 weeks (around 12/18/2019). This can be an in-person, a virtual Webex or a telephone follow up visit.  Subjective:   Yvonne Stone is a 29 y.o. female presenting today for evaluation of  Chief  Complaint  Patient presents with  . Allergic Rhinitis     sneezing, watery eyes, throat feels itchy. has not noticed a time that the symptoms are worse. the throat itching happens at night before going to bed.   . Food Intolerance    Pineapple. age 29. tongue itching, tongue burning. seen at ED. no pineapple since.     8 has a history of the following: Patient Active Problem List   Diagnosis Date Noted  . Seasonal and perennial allergic rhinitis 11/06/2019  . UTI (urinary tract infection) 12/10/2018  . Depression 04/11/2016  . Umbilical hernia 03/28/2016  . History of postpartum depression 02/15/2016  . 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 Heart murmur 01/23/2014  . Migraines     History obtained from: chart review and patient.  01/25/2014 was referred by Galvin Proffer, MD.     Yvonne Stone is a 29 y.o. female presenting for an evaluation of environmental allergies and food allergies.   Allergic Rhinitis Symptom History: She reports that she first had issues when she was a teenager. She spent most of her time in [place]. She has tried a variety of medications for her symptoms including Claritin (no help). She is on Allegra-D which she is taking every day. She stopped five days ago. She has been on the D component for two months. She has tried Claritin but was otherwise "dealing with it". She has not used nose sprays. She has never been tested. She has been in [place] since 2016 or  so. She does have symptoms throughout the year. It tends to get worse in the spring and the summer.   Food Allergy Symptom History: She reports that she has a history of a pineapple allergy. This was diagnosed at age 29. She reports that she had a lot of itching at the time with some swelling as well. She was actually taken to the hospital. She has not had pineapple since that time. She otherwise tolerates her foods without adverse event.   She will  occasionally get antibiotics for UTIs and such. Otherwise she has no history of recurrent infections.   Otherwise, there is no history of other atopic diseases, including food allergies, drug allergies, stinging insect allergies, eczema, urticaria or contact dermatitis. There is no significant infectious history. Vaccinations are up to date.    Past Medical History: Patient Active Problem List   Diagnosis Date Noted  . Seasonal and perennial allergic rhinitis 11/06/2019  . UTI (urinary tract infection) 12/10/2018  . Depression 04/11/2016  . Umbilical hernia 97/67/3419  . History of postpartum depression 02/15/2016  . 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 Heart murmur 01/23/2014  . Migraines     Medication List:  Allergies as of 11/06/2019      Reactions   Pineapple Other (See Comments)   Tongue itching and burning.       Medication List       Accurate as of November 06, 2019 12:13 PM. If you have any questions, ask your nurse or doctor.        STOP taking these medications   clomiPHENE 50 MG tablet Commonly known as: CLOMID Stopped by: Valentina Shaggy, MD   medroxyPROGESTERone 10 MG tablet Commonly known as: PROVERA Stopped by: Valentina Shaggy, MD     TAKE these medications   ALLEGRA-D PO Take by mouth.   azelastine 0.1 % nasal spray Commonly known as: ASTELIN Place 2 sprays into both nostrils 2 (two) times daily as needed for rhinitis. Use in each nostril as directed Started by: Valentina Shaggy, MD   AZO-CRANBERRY PO Take by mouth.   baclofen 10 MG tablet Commonly known as: LIORESAL Take 10 mg by mouth 2 (two) times daily.   PROBIOTIC DAILY PO Take by mouth.   zonisamide 25 MG capsule Commonly known as: ZONEGRAN       Birth History: non-contributory  Developmental History: non-contributory  Past Surgical History: Past Surgical History:  Procedure Laterality Date  . CESAREAN SECTION    . CESAREAN SECTION  N/A 07/15/2016   Procedure: REPEAT CESAREAN SECTION;  Surgeon: Jonnie Kind, MD;  Location: [facility];  Service: Obstetrics;  Laterality: N/A;     Family History: Family History  Problem Relation Age of Onset  . Heart murmur Mother   . Hypertension Mother   . Diabetes Mother   . Thyroid disease Mother   . Heart murmur Sister   . Urticaria Sister   . Cancer Maternal Grandmother        breast   . Hypertension Maternal Grandmother   . Heart murmur Sister   . Hypertension Father   . Urticaria Father   . Asthma Father   . Hypertension Maternal Grandfather   . Thyroid disease Maternal Aunt   . Hypertension Maternal Aunt   . Diabetes Maternal Aunt   . Diabetes Maternal Uncle   . Hypertension Maternal Uncle   . Diabetes Paternal Aunt   . Hypertension Paternal Aunt   .  Hypertension Paternal Uncle   . Diabetes Paternal Uncle   . Seizures Paternal Grandmother   . Stroke Paternal Grandmother   . Hypertension Paternal Grandmother   . Diabetes Paternal Grandfather   . Hypertension Paternal Grandfather   . Angioedema Neg Hx   . Allergic rhinitis Neg Hx   . Atopy Neg Hx   . Eczema Neg Hx   . Immunodeficiency Neg Hx      Social History: [name] lives at home with her husband and five children (blended family + one niece that she adopted). She is working on getting her associates degree in nursing. She has around one more year left before matricuating into the nursing program. 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  She has five children at home and one is a patient here. Her husband works for [employer] in [place].    Review of Systems  Constitutional: Negative.  Negative for chills, fever, malaise/fatigue and weight loss.  HENT: Positive for congestion and sinus pain. Negative for ear discharge and ear pain.        Positive for postnasal drip.  Eyes: Negative for pain, discharge and redness.  Respiratory: Negative for cough, sputum production, shortness of breath  and wheezing.   Cardiovascular: Negative.  Negative for chest pain and palpitations.  Gastrointestinal: Negative for abdominal pain, constipation, diarrhea, heartburn, nausea and vomiting.  Skin: Negative.  Negative for itching and rash.  Neurological: Negative for dizziness and headaches.  Endo/Heme/Allergies: Positive for environmental allergies. Does not bruise/bleed easily.       Objective:   Blood pressure 114/72, pulse 78, temperature 99.2 F (37.3 C), temperature source Temporal, resp. rate 18, height 5\' 5"  (1.651 m), weight 202 lb (91.6 kg), SpO2 97 %. Body mass index is 33.61 kg/m.   Physical Exam:   Physical Exam  Constitutional: She appears well-developed.  Very pleasant female.  Talkative.  HENT:  Head: Normocephalic and atraumatic.  Right Ear: Tympanic membrane, external ear and ear canal normal. No drainage, swelling or tenderness. Tympanic membrane is not injected, not scarred, not erythematous, not retracted and not bulging.  Left Ear: Tympanic membrane, external ear and ear canal normal. No drainage, swelling or tenderness. Tympanic membrane is not injected, not scarred, not erythematous, not retracted and not bulging.  Nose: Mucosal edema and rhinorrhea present. No nasal deformity or septal deviation. No epistaxis. Right sinus exhibits no maxillary sinus tenderness and no frontal sinus tenderness. Left sinus exhibits no maxillary sinus tenderness and no frontal sinus tenderness.  Mouth/Throat: Uvula is midline and oropharynx is clear and moist. Mucous membranes are not pale and not dry.  Turbinates enlarged bilaterally.  Eyes: Pupils are equal, round, and reactive to light. Conjunctivae and EOM are normal. Right eye exhibits no chemosis and no discharge. Left eye exhibits no chemosis and no discharge. Right conjunctiva is not injected. Left conjunctiva is not injected.  Allergic shiners bilaterally.  Cardiovascular: Normal rate, regular rhythm and normal heart  sounds.  Respiratory: Effort normal and breath sounds normal. No accessory muscle usage. No tachypnea. No respiratory distress. She has no wheezes. She has no rhonchi. She has no rales. She exhibits no tenderness.  GI: There is no abdominal tenderness. There is no rebound and no guarding.  Lymphadenopathy:       Head (right side): No submandibular, no tonsillar and no occipital adenopathy present.       Head (left side): No submandibular, no tonsillar and no occipital adenopathy present.    She has no cervical  adenopathy.  Neurological: She is alert.  Skin: No abrasion, no petechiae and no rash noted. Rash is not papular, not vesicular and not urticarial. No erythema. No pallor.  No eczematous or urticarial lesions noted.  Psychiatric: She has a normal mood and affect.     Diagnostic studies:      Allergy Studies:    Airborne Adult Perc - 11/06/19 1010    Time Antigen Placed  1010    Allergen Manufacturer  Waynette Buttery    Location  Back    Number of Test  59    Panel 1  Select    1. Control-Buffer 50% Glycerol  Negative    2. Control-Histamine 1 mg/ml  2+    3. Albumin saline  Negative    4. Bahia  4+    5. French Southern Territories  2+    6. Johnson  3+    7. Kentucky Blue  3+    8. Meadow Fescue  2+    9. Perennial Rye  2+    10. Sweet Vernal  2+    11. Timothy  3+    12. Cocklebur  Negative    13. Burweed Marshelder  Negative    14. Ragweed, short  Negative    15. Ragweed, Giant  Negative    16. Plantain,  English  Negative    17. Lamb's Quarters  Negative    18. Sheep Sorrell  Negative    19. Rough Pigweed  Negative    20. Marsh Elder, Rough  Negative    21. Mugwort, Common  Negative    22. Ash mix  Negative    23. Birch mix  Negative    24. Beech American  Negative    25. Box, Elder  Negative    26. Cedar, red  Negative    27. Cottonwood, Guinea-Bissau  Negative    28. Elm mix  Negative    29. Hickory  4+    30. Maple mix  Negative    31. Oak, Guinea-Bissau mix  2+    32. Pecan Pollen  3+     33. Pine mix  Negative    34. Sycamore Eastern  Negative    35. Walnut, Black Pollen  Negative    36. Alternaria alternata  Negative    37. Cladosporium Herbarum  Negative    38. Aspergillus mix  Negative    39. Penicillium mix  Negative    40. Bipolaris sorokiniana (Helminthosporium)  Negative    41. Drechslera spicifera (Curvularia)  Negative    42. Mucor plumbeus  Negative    43. Fusarium moniliforme  Negative    44. Aureobasidium pullulans (pullulara)  Negative    45. Rhizopus oryzae  Negative    46. Botrytis cinera  Negative    47. Epicoccum nigrum  Negative    48. Phoma betae  Negative    49. Candida Albicans  Negative    50. Trichophyton mentagrophytes  Negative    51. Mite, D Farinae  5,000 AU/ml  4+    52. Mite, D Pteronyssinus  5,000 AU/ml  4+    53. Cat Hair 10,000 BAU/ml  3+    54.  Dog Epithelia  Negative    55. Mixed Feathers  Negative    56. Horse Epithelia  Negative    57. Cockroach, German  Negative    58. Mouse  Negative    59. Tobacco Leaf  Negative     Intradermal - 11/06/19 1100  Time Antigen Placed  1100    Allergen Manufacturer  Greer    Location  Arm    Number of Test  9    Control  Negative    Ragweed mix  2+    Weed mix  Negative    Mold 1  Negative    Mold 2  Negative    Mold 3  Negative    Mold 4  Negative    Dog  2+    Cockroach  Negative     Food Adult Perc - 11/06/19 1000    Time Antigen Placed  1011    Allergen Manufacturer  Greer    Location  Back    Number of allergen test  1    Panel 2  Select    63. Pineapple  Negative       Allergy testing results were read and interpreted by myself, documented by clinical staff.         Malachi Bonds, MD [facility]

## 2019-12-20 ENCOUNTER — Ambulatory Visit: Payer: Federal, State, Local not specified - PPO | Admitting: Allergy & Immunology

## 2020-02-06 DIAGNOSIS — N83291 Other ovarian cyst, right side: Secondary | ICD-10-CM | POA: Diagnosis not present

## 2020-03-24 ENCOUNTER — Other Ambulatory Visit: Payer: Self-pay

## 2020-03-24 ENCOUNTER — Ambulatory Visit
Admission: EM | Admit: 2020-03-24 | Discharge: 2020-03-24 | Disposition: A | Discharge status: Home / Self Care | Payer: Federal, State, Local not specified - PPO | Attending: Emergency Medicine | Admitting: Emergency Medicine

## 2020-03-24 DIAGNOSIS — N760 Acute vaginitis: Secondary | ICD-10-CM

## 2020-03-24 DIAGNOSIS — B373 Candidiasis of vulva and vagina: Secondary | ICD-10-CM | POA: Diagnosis not present

## 2020-03-24 DIAGNOSIS — B9689 Other specified bacterial agents as the cause of diseases classified elsewhere: Secondary | ICD-10-CM | POA: Diagnosis not present

## 2020-03-24 DIAGNOSIS — B3731 Acute candidiasis of vulva and vagina: Secondary | ICD-10-CM

## 2020-03-24 MED ORDER — METRONIDAZOLE 500 MG PO TABS
500.0000 mg | ORAL_TABLET | Freq: Two times a day (BID) | ORAL | 0 refills | Status: DC
Start: 2020-03-24 — End: 2020-09-07

## 2020-03-24 MED ORDER — FLUCONAZOLE 200 MG PO TABS
200.0000 mg | ORAL_TABLET | Freq: Once | ORAL | 0 refills | Status: AC
Start: 2020-03-24 — End: 2020-03-24

## 2020-03-24 NOTE — Discharge Instructions (Signed)
Declined vaginal self swab Prescribed metronidazole 500 mg twice daily for 7 days (do not take while consuming alcohol and/or if breastfeeding) Prescribed diflucan 200 mg once daily and then second dose 72 hours later Take medications as prescribed and to completion Please abstain from sexual activity until you and your partner(s) have been treated Follow up with PCP or [facility] if symptoms persists Return here or go to ER if you have any new or worsening symptoms fever, chills, nausea, vomiting, abdominal or pelvic pain, painful intercourse, vaginal discharge, vaginal bleeding, persistent symptoms despite treatment, etc.

## 2020-03-24 NOTE — ED Triage Notes (Signed)
Pt presents with c/o vaginal irritation with white discharge for past week

## 2020-03-24 NOTE — ED Provider Notes (Signed)
[facility]   [id number] 03/24/20 Arrival Time: 1623   [id number] DISCHARGE  SUBJECTIVE:  Yvonne Stone is a 29 y.o. female who presented to the urgent care for complaint of vaginal irritation with white discharge for the past week.  She denies a precipitating event, recent sexual encounter or recent antibiotic use.  Patient is sexually active with 1 of female partners.  Describes discharge as thick white.  She has tried OTC medications without relief.  Denies precipitating event.  She reports similar symptoms in the past and was diagnosed with BV and yeast infection and treated accordingly.  She denies fever, chills, nausea, vomiting, abdominal or pelvic pain, urinary symptoms, vaginal bleeding, dyspareunia, vaginal rashes or lesions.   Patient's last menstrual period was 03/12/2020. Current birth control method: Compliant with BC:  ROS: As per HPI.  All other pertinent ROS negative.     Past Medical History:  Diagnosis Date  . Depression   . Herpes    genital  . History of cesarean section 12/16/2015  . Mental disorder    postpartum depression  . Migraines   . Supervision of normal pregnancy 12/16/2015    [facility] Care at   8+5 weeks FOB  Corwin Levins 27 yo bm 11th child Dating By  [place] Pap  12/16/15 GC/CT Initial:                36+wks: Genetic Screen NT/IT:  CF screen  Anatomic [place]  Flu vaccine  Tdap Recommended ~ 28wks Glucose Screen  2 hr GBS  Feed Preference  Contraception  Circumcision  Childbirth Classes  Pediatrician    . UTI (urinary tract infection) 12/10/2018   + Ecoli, tx'd 7/6 with septra ds   . Vaginal Pap smear, abnormal    Past Surgical History:  Procedure Laterality Date  . CESAREAN SECTION    . CESAREAN SECTION N/A 07/15/2016   Procedure: REPEAT CESAREAN SECTION;  Surgeon: Tilda Burrow, MD;  Location: [facility];  Service: Obstetrics;  Laterality: N/A;   Allergies  Allergen Reactions  . Pineapple Other (See Comments)     Tongue itching and burning.    No current facility-administered medications on file prior to encounter.   Current Outpatient Medications on File Prior to Encounter  Medication Sig Dispense Refill  . azelastine (ASTELIN) 0.1 % nasal spray Place 2 sprays into both nostrils 2 (two) times daily as needed for rhinitis. Use in each nostril as directed 30 mL 12  . AZO-CRANBERRY PO Take by mouth.    . baclofen (LIORESAL) 10 MG tablet Take 10 mg by mouth 2 (two) times daily.    Marland Kitchen Fexofenadine-Pseudoephedrine (ALLEGRA-D PO) Take by mouth.    . Probiotic Product (PROBIOTIC DAILY PO) Take by mouth.    . zonisamide (ZONEGRAN) 25 MG capsule       Social History   Socioeconomic History  . Marital status: Married    Spouse name: Not on file  . Number of children: 2  . Years of education: Not on file  . Highest education level: Not on file  Occupational History  . Not on file  Tobacco Use  . Smoking status: Never Smoker  . Smokeless tobacco: Never Used  Vaping Use  . Vaping Use: Never used  Substance and Sexual Activity  . Alcohol use: Yes    Comment: occ  . Drug use: No  . Sexual activity: Yes    Birth control/protection: None  Other Topics Concern  . Not  on file  Social History Narrative  . Not on file   Social Determinants of Health   Financial Resource Strain:   . Difficulty of Paying Living Expenses: Not on file  Food Insecurity:   . Worried About Programme researcher, broadcasting/film/video in the Last Year: Not on file  . Ran Out of Food in the Last Year: Not on file  Transportation Needs:   . Lack of Transportation (Medical): Not on file  . Lack of Transportation (Non-Medical): Not on file  Physical Activity:   . Days of Exercise per Week: Not on file  . Minutes of Exercise per Session: Not on file  Stress:   . Feeling of Stress : Not on file  Social Connections:   . Frequency of Communication with Friends and Family: Not on file  . Frequency of Social Gatherings with Friends and Family: Not on  file  . Attends Religious Services: Not on file  . Active Member of Clubs or Organizations: Not on file  . Attends Banker Meetings: Not on file  . Marital Status: Not on file  Intimate Partner Violence:   . Fear of Current or Ex-Partner: Not on file  . Emotionally Abused: Not on file  . Physically Abused: Not on file  . Sexually Abused: Not on file   Family History  Problem Relation Age of Onset  . Heart murmur Mother   . Hypertension Mother   . Diabetes Mother   . Thyroid disease Mother   . Heart murmur Sister   . Urticaria Sister   . Cancer Maternal Grandmother        breast   . Hypertension Maternal Grandmother   . Heart murmur Sister   . Hypertension Father   . Urticaria Father   . Asthma Father   . Hypertension Maternal Grandfather   . Thyroid disease Maternal Aunt   . Hypertension Maternal Aunt   . Diabetes Maternal Aunt   . Diabetes Maternal Uncle   . Hypertension Maternal Uncle   . Diabetes Paternal Aunt   . Hypertension Paternal Aunt   . Hypertension Paternal Uncle   . Diabetes Paternal Uncle   . Seizures Paternal Grandmother   . Stroke Paternal Grandmother   . Hypertension Paternal Grandmother   . Diabetes Paternal Grandfather   . Hypertension Paternal Grandfather   . Angioedema Neg Hx   . Allergic rhinitis Neg Hx   . Atopy Neg Hx   . Eczema Neg Hx   . Immunodeficiency Neg Hx     OBJECTIVE:  Vitals:   03/24/20 1635  BP: 100/69  Pulse: 62  Resp: 20  Temp: 98.1 F (36.7 C)  SpO2: 98%     General appearance: Alert, NAD, appears stated age Head: NCAT Throat: lips, mucosa, and tongue normal; teeth and gums normal Lungs: CTA bilaterally without adventitious breath sounds Heart: regular rate and rhythm.  Radial pulses 2+ symmetrical bilaterally Back: no CVA tenderness Abdomen: soft, non-tender; bowel sounds normal; no masses or organomegaly; no guarding or rebound tenderness GU: declines recurrent cellulitis Skin: warm and  dry Psychological:  Alert and cooperative. Normal mood and affect.  LABS:  Results for orders placed or performed in visit on 01/15/19  NuSwab Vaginitis Plus (VG+)  Result Value Ref Range   Atopobium vaginae High - 2 (A) Score   BVAB 2 High - 2 (A) Score   Megasphaera 1 Low - 0 Score   Candida albicans, NAA Negative Negative   Candida glabrata, NAA Negative Negative  Trich vag by NAA Negative Negative   Chlamydia trachomatis, NAA Negative Negative   Neisseria gonorrhoeae, NAA Negative Negative  POCT urine pregnancy  Result Value Ref Range   Preg Test, Ur Negative Negative    Labs Reviewed - No data to display  ASSESSMENT & PLAN:  1. Vaginal yeast infection   2. Bacterial vaginosis     Meds ordered this encounter  Medications  . metroNIDAZOLE (FLAGYL) 500 MG tablet    Sig: Take 1 tablet (500 mg total) by mouth 2 (two) times daily.    Dispense:  14 tablet    Refill:  0  . fluconazole (DIFLUCAN) 200 MG tablet    Sig: Take 1 tablet (200 mg total) by mouth once for 1 dose. May take the second dose 72 hours after the first if your symptom does not resolve    Dispense:  2 tablet    Refill:  0    Pending: Labs Reviewed - No data to display  Declined vaginal self swab Prescribed metronidazole 500 mg twice daily for 7 days (do not take while consuming alcohol and/or if breastfeeding) Prescribed diflucan 200 mg once daily and then second dose 72 hours later Take medications as prescribed and to completion Please abstain from sexual activity until you and your partner(s) have been treated Follow up with PCP or [facility] if symptoms persists Return here or go to ER if you have any new or worsening symptoms fever, chills, nausea, vomiting, abdominal or pelvic pain, painful intercourse, vaginal discharge, vaginal bleeding, persistent symptoms despite treatment, etc...  Reviewed expectations re: course of current medical issues. Questions answered. Outlined signs and  symptoms indicating need for more acute intervention. Patient verbalized understanding. After Visit Summary given.       Durward Parcel, FNP 03/24/20 1647

## 2020-04-03 ENCOUNTER — Encounter (HOSPITAL_COMMUNITY): Payer: Self-pay

## 2020-04-03 ENCOUNTER — Encounter: Payer: Self-pay | Admitting: Emergency Medicine

## 2020-04-03 ENCOUNTER — Emergency Department (HOSPITAL_COMMUNITY)
Admission: EM | Admit: 2020-04-03 | Discharge: 2020-04-03 | Disposition: A | Discharge status: Home / Self Care | Payer: Federal, State, Local not specified - PPO | Attending: Emergency Medicine | Admitting: Emergency Medicine

## 2020-04-03 ENCOUNTER — Emergency Department (HOSPITAL_COMMUNITY): Payer: Federal, State, Local not specified - PPO

## 2020-04-03 ENCOUNTER — Ambulatory Visit
Admission: EM | Admit: 2020-04-03 | Discharge: 2020-04-03 | Disposition: A | Discharge status: Home / Self Care | Payer: Federal, State, Local not specified - PPO

## 2020-04-03 ENCOUNTER — Other Ambulatory Visit: Payer: Self-pay

## 2020-04-03 DIAGNOSIS — M25512 Pain in left shoulder: Secondary | ICD-10-CM | POA: Insufficient documentation

## 2020-04-03 DIAGNOSIS — R0789 Other chest pain: Secondary | ICD-10-CM | POA: Diagnosis not present

## 2020-04-03 DIAGNOSIS — M549 Dorsalgia, unspecified: Secondary | ICD-10-CM | POA: Diagnosis not present

## 2020-04-03 DIAGNOSIS — R079 Chest pain, unspecified: Secondary | ICD-10-CM | POA: Diagnosis not present

## 2020-04-03 LAB — CBC
HCT: 38.1 % (ref 36.0–46.0)
Hemoglobin: 12 g/dL (ref 12.0–15.0)
MCH: 27.1 pg (ref 26.0–34.0)
MCHC: 31.5 g/dL (ref 30.0–36.0)
MCV: 86 fL (ref 80.0–100.0)
Platelets: 208 10*3/uL (ref 150–400)
RBC: 4.43 MIL/uL (ref 3.87–5.11)
RDW: 13.2 % (ref 11.5–15.5)
WBC: 5.3 10*3/uL (ref 4.0–10.5)
nRBC: 0 % (ref 0.0–0.2)

## 2020-04-03 LAB — BASIC METABOLIC PANEL
Anion gap: 8 (ref 5–15)
BUN: 10 mg/dL (ref 6–20)
CO2: 25 mmol/L (ref 22–32)
Calcium: 9.1 mg/dL (ref 8.9–10.3)
Chloride: 103 mmol/L (ref 98–111)
Creatinine, Ser: 0.62 mg/dL (ref 0.44–1.00)
GFR, Estimated: >60 mL/min (ref >60)
Glucose, Bld: 94 mg/dL (ref 70–99)
Potassium: 3.6 mmol/L (ref 3.5–5.1)
Sodium: 136 mmol/L (ref 135–145)

## 2020-04-03 LAB — TROPONIN I (HIGH SENSITIVITY)
Troponin I (High Sensitivity): <2 ng/L (ref <18)
Troponin I (High Sensitivity): <2 ng/L (ref <18)

## 2020-04-03 LAB — POC URINE PREG, ED: Preg Test, Ur: NEGATIVE

## 2020-04-03 NOTE — ED Notes (Signed)
Patient is being discharged from the Urgent Care and sent to the Emergency Department via pov. Per B.Wurst, patient is in need of higher level of care due to cp. Patient is aware and verbalizes understanding of plan of care.  Vitals:   04/03/20 1649  BP: 100/68  Pulse: 78  Resp: 16  Temp: 98.2 F (36.8 C)  SpO2: 98%

## 2020-04-03 NOTE — ED Triage Notes (Signed)
Cp pain to lt side of chest on and off for a couple of months, radiates to lt arm and through to back at times.

## 2020-04-03 NOTE — Discharge Instructions (Addendum)
As discussed, your evaluation today has been largely reassuring.  But, it is important that you monitor your condition carefully, and do not hesitate to return to the ED if you develop new, or concerning changes in your condition.  Otherwise, please follow-up with your physician for appropriate ongoing care.  Please discuss Holter monitoring or cardiac monitoring with your physician if you continue to have episodes.

## 2020-04-03 NOTE — ED Triage Notes (Signed)
Pt presents to ED with complaints of left sided chest pain shooting down left arm x 3 days.

## 2020-04-03 NOTE — ED Provider Notes (Signed)
[facility] EMERGENCY DEPARTMENT Provider Note   CSN: [id number] Arrival date & time: 04/03/20  1704     History Chief Complaint  Patient presents with  . Chest Pain    Yvonne Stone is a 29 y.o. female.  HPI   Patient presents with pain in her left upper chest, shoulder, upper back.  Currently she actually has no pain, no complaints or discomfort.  However, the patient notes that about 3 to 4 days ago she began to have episodes of pain in these areas.  There is no clear precipitant.  Since that time she has had episodes that occur randomly, he is spontaneously of pain in a similar distribution.  Pain is moderate, sharp, not exertional, pleuritic, positional.  No associated dyspnea, no fever, no nausea, vomiting. Patient states that she is generally well, takes no medication regularly.   Past Medical History:  Diagnosis Date  . Depression   . Herpes    genital  . History of cesarean section 12/16/2015  . Mental disorder    postpartum depression  . Migraines   . Supervision of normal pregnancy 12/16/2015    [facility] at   8+5 weeks FOB  Corwin Levins 49 yo bm 11th child Dating By  [place] Pap  12/16/15 GC/CT Initial:                36+wks: Genetic Screen NT/IT:  CF screen  Anatomic [place]  Flu vaccine  Tdap Recommended ~ 28wks Glucose Screen  2 hr GBS  Feed Preference  Contraception  Circumcision  Childbirth Classes  Pediatrician    . UTI (urinary tract infection) 12/10/2018   + Ecoli, tx'd 7/6 with septra ds   . Vaginal Pap smear, abnormal     Patient Active Problem List   Diagnosis Date Noted  . Seasonal and perennial allergic rhinitis 11/06/2019  . UTI (urinary tract infection) 12/10/2018  . Depression 04/11/2016  . Umbilical hernia 03/28/2016  . History of postpartum depression 02/15/2016  . 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 Heart murmur 01/23/2014  . Migraines     Past Surgical History:  Procedure Laterality Date    . CESAREAN SECTION    . CESAREAN SECTION N/A 07/15/2016   Procedure: REPEAT CESAREAN SECTION;  Surgeon: Tilda Burrow, MD;  Location: [facility] BIRTHING SUITES;  Service: Obstetrics;  Laterality: N/A;     OB History    Gravida  3   Para  2   Term  2   Preterm      AB  1   Living  2     SAB  1   TAB      Ectopic      Multiple  0   Live Births  2           Family History  Problem Relation Age of Onset  . Heart murmur Mother   . Hypertension Mother   . Diabetes Mother   . Thyroid disease Mother   . Heart murmur Sister   . Urticaria Sister   . Cancer Maternal Grandmother        breast   . Hypertension Maternal Grandmother   . Heart murmur Sister   . Hypertension Father   . Urticaria Father   . Asthma Father   . Hypertension Maternal Grandfather   . Thyroid disease Maternal Aunt   . Hypertension Maternal Aunt   . Diabetes Maternal Aunt   .  Diabetes Maternal Uncle   . Hypertension Maternal Uncle   . Diabetes Paternal Aunt   . Hypertension Paternal Aunt   . Hypertension Paternal Uncle   . Diabetes Paternal Uncle   . Seizures Paternal Grandmother   . Stroke Paternal Grandmother   . Hypertension Paternal Grandmother   . Diabetes Paternal Grandfather   . Hypertension Paternal Grandfather   . Angioedema Neg Hx   . Allergic rhinitis Neg Hx   . Atopy Neg Hx   . Eczema Neg Hx   . Immunodeficiency Neg Hx     Social History   Tobacco Use  . Smoking status: Never Smoker  . Smokeless tobacco: Never Used  Vaping Use  . Vaping Use: Never used  Substance Use Topics  . Alcohol use: Yes    Comment: occ  . Drug use: No    Home Medications Prior to Admission medications   Medication Sig Start Date End Date Taking? Authorizing Provider  acetaminophen (TYLENOL) 500 MG tablet Take 500 mg by mouth every 6 (six) hours as needed.   Yes [provider]  azelastine (ASTELIN) 0.1 % nasal spray Place 2 sprays into both nostrils 2 (two) times daily as needed  for rhinitis. Use in each nostril as directed 11/06/19  Yes Alfonse Spruce, MD  fluconazole (DIFLUCAN) 200 MG tablet Take 200 mg by mouth daily. 03/24/20  Yes [provider]  metroNIDAZOLE (FLAGYL) 500 MG tablet Take 1 tablet (500 mg total) by mouth 2 (two) times daily. 03/24/20  Yes Avegno, Zachery Dakins, FNP  Probiotic Product (PROBIOTIC DAILY PO) Take by mouth.   Yes [provider]  AZO-CRANBERRY PO Take by mouth. Patient not taking: Reported on 04/03/2020    [provider]    Allergies    Patient has no known allergies.  Review of Systems   Review of Systems  Constitutional:       Per HPI, otherwise negative  HENT:       Per HPI, otherwise negative  Respiratory:       Per HPI, otherwise negative  Cardiovascular:       Per HPI, otherwise negative  Gastrointestinal: Negative for vomiting.  Endocrine:       Negative aside from HPI  Genitourinary:       Neg aside from HPI   Musculoskeletal:       Per HPI, otherwise negative  Skin: Negative.   Neurological: Negative for syncope.    Physical Exam Updated Vital Signs BP 112/80   Pulse 78   Temp 98.4 F (36.9 C) (Oral)   Resp (!) 22   Ht 5\' 4"  (1.626 m)   Wt 90.7 kg   LMP 03/12/2020   SpO2 100%   BMI 34.33 kg/m   Physical Exam Vitals and nursing note reviewed.  Constitutional:      General: She is not in acute distress.    Appearance: She is well-developed.  HENT:     Head: Normocephalic and atraumatic.  Eyes:     Conjunctiva/sclera: Conjunctivae normal.  Cardiovascular:     Rate and Rhythm: Normal rate and regular rhythm.  Pulmonary:     Effort: Pulmonary effort is normal. No respiratory distress.     Breath sounds: Normal breath sounds. No stridor.  Abdominal:     General: There is no distension.  Musculoskeletal:     Left shoulder: No swelling, deformity, effusion, tenderness, bony tenderness or crepitus. Normal range of motion.  Skin:    General: Skin is warm and  dry.   Neurological:     Mental Status: She is alert and oriented to person, place, and time.     Cranial Nerves: No cranial nerve deficit.     Motor: No weakness, tremor, atrophy or abnormal muscle tone.     ED Results / Procedures / Treatments   Labs (all labs ordered are listed, but only abnormal results are displayed) Labs Reviewed  BASIC METABOLIC PANEL  CBC  POC URINE PREG, ED  TROPONIN I (HIGH SENSITIVITY)  TROPONIN I (HIGH SENSITIVITY)    EKG Sinus rhythm, rate 70, RSR prime anteriorly, unremarkable.  On cardiac monitor the patient has sinus rhythm, rate 70s, unremarkable  Pulse oximetry 100% room air normal  Radiology DG Chest 2 View  Result Date: 04/03/2020 CLINICAL DATA:  Chest pain EXAM: CHEST - 2 VIEW COMPARISON:  None. FINDINGS: There is no pneumothorax or large pleural effusion. There is an apparent cluster of nodules in the right mid lung zone with possible adjacent mild architectural distortion/scarring. There is no pleural effusion. No acute osseous abnormality. The heart size is unremarkable. There may be an area of scarring or atelectasis in the left upper lung zone. IMPRESSION: 1. No acute cardiopulmonary process. 2. Apparent cluster of nodules in the right mid lung zone with possible adjacent mild architectural distortion/scarring. This may related to a prior infectious or inflammatory insult and can be correlated with a nonemergent outpatient CT of the chest. Electronically Signed   By: Katherine Mantle M.D.   On: 04/03/2020 18:43    Procedures Procedures (including critical care time)  Medications Ordered in ED Medications - No data to display  ED Course  I have reviewed the triage vital signs and the nursing notes.  Pertinent labs & imaging results that were available during my care of the patient were reviewed by me and considered in my medical decision making (see chart for details).    MDM Rules/Calculators/A&P                          9:12  PM On repeat exam patient is in no distress, sitting up.  Hemodynamically she remains the same, with monitor findings similar to above.  I reviewed the x-ray, labs, discussed them with her, no evidence for left-sided pathology.  Patient is unclear if she has had prior right-sided infection, but here has no dyspnea, increased work of breathing or hypoxia.  We discussed possibilities for pain.  Here no evidence for atypical ACS, PE, pneumothorax, pneumonia or other acute new pathology. Patient appropriate for, amenable to discharge with close outpatient follow-up. Final Clinical Impression(s) / ED Diagnoses Final diagnoses:  Atypical chest pain    Rx / DC Orders ED Discharge Orders    None       Gerhard Munch, MD 04/03/20 2113

## 2020-04-13 ENCOUNTER — Ambulatory Visit
Admission: EM | Admit: 2020-04-13 | Discharge: 2020-04-13 | Disposition: A | Discharge status: Home / Self Care | Payer: Federal, State, Local not specified - PPO | Attending: Emergency Medicine | Admitting: Emergency Medicine

## 2020-04-13 ENCOUNTER — Encounter: Payer: Self-pay | Admitting: Emergency Medicine

## 2020-04-13 ENCOUNTER — Other Ambulatory Visit: Payer: Self-pay

## 2020-04-13 DIAGNOSIS — J029 Acute pharyngitis, unspecified: Secondary | ICD-10-CM

## 2020-04-13 LAB — POCT RAPID STREP A (OFFICE): Rapid Strep A Screen: NEGATIVE

## 2020-04-13 MED ORDER — MELOXICAM 15 MG PO TABS: 15.0000 mg | ORAL_TABLET | Freq: Every day | ORAL | 0 refills | Status: DC

## 2020-04-13 NOTE — Discharge Instructions (Signed)
Strep negative.  Culture sent.   Declines covid test Get plenty of rest and push fluids Mobic for pain Use OTC zyrtec for nasal congestion, runny nose, and/or sore throat Use OTC flonase for nasal congestion and runny nose Use medications daily for symptom relief Use OTC medications like ibuprofen or tylenol as needed fever or pain Call or go to the ED if you have any new or worsening symptoms such as fever, cough, shortness of breath, chest tightness, chest pain, turning blue, changes in mental status, etc..Marland Kitchen

## 2020-04-13 NOTE — ED Triage Notes (Signed)
Pain to RT side of throat that started this morning

## 2020-04-13 NOTE — ED Provider Notes (Signed)
[facility] CARE CENTER   [id number] 04/13/20 Arrival Time: 1657   CC: Sore throat  SUBJECTIVE: History from: patient.  Yvonne Stone is a 29 y.o. female who presents with sore throat x today.  Denies sick exposure to COVID, flu or strep.  Denies alleviating factors.  Symptoms are made worse with swallowing, but tolerating own secretions.  Reports previous symptoms in the past.   Denies fever, chills, fatigue, sinus pain, rhinorrhea, cough, SOB, wheezing, chest pain, nausea, changes in bowel or bladder habits.     ROS: As per HPI.  All other pertinent ROS negative.     Past Medical History:  Diagnosis Date  . Depression   . Herpes    genital  . History of cesarean section 12/16/2015  . Mental disorder    postpartum depression  . Migraines   . Supervision of normal pregnancy 12/16/2015    [facility] at   8+5 weeks FOB  Corwin Levins 89 yo bm 11th child Dating By  [place] Pap  12/16/15 GC/CT Initial:                36+wks: Genetic Screen NT/IT:  CF screen  Anatomic [place]  Flu vaccine  Tdap Recommended ~ 28wks Glucose Screen  2 hr GBS  Feed Preference  Contraception  Circumcision  Childbirth Classes  Pediatrician    . UTI (urinary tract infection) 12/10/2018   + Ecoli, tx'd 7/6 with septra ds   . Vaginal Pap smear, abnormal    Past Surgical History:  Procedure Laterality Date  . CESAREAN SECTION    . CESAREAN SECTION N/A 07/15/2016   Procedure: REPEAT CESAREAN SECTION;  Surgeon: Tilda Burrow, MD;  Location: [facility] BIRTHING SUITES;  Service: Obstetrics;  Laterality: N/A;   No Known Allergies No current facility-administered medications on file prior to encounter.   Current Outpatient Medications on File Prior to Encounter  Medication Sig Dispense Refill  . acetaminophen (TYLENOL) 500 MG tablet Take 500 mg by mouth every 6 (six) hours as needed.    Marland Kitchen azelastine (ASTELIN) 0.1 % nasal spray Place 2 sprays into both nostrils 2 (two) times daily as needed for rhinitis. Use  in each nostril as directed 30 mL 12  . AZO-CRANBERRY PO Take by mouth. (Patient not taking: Reported on 04/03/2020)    . fluconazole (DIFLUCAN) 200 MG tablet Take 200 mg by mouth daily.    . metroNIDAZOLE (FLAGYL) 500 MG tablet Take 1 tablet (500 mg total) by mouth 2 (two) times daily. 14 tablet 0  . Probiotic Product (PROBIOTIC DAILY PO) Take by mouth.     Social History   Socioeconomic History  . Marital status: Married    Spouse name: Not on file  . Number of children: 2  . Years of education: Not on file  . Highest education level: Not on file  Occupational History  . Not on file  Tobacco Use  . Smoking status: Never Smoker  . Smokeless tobacco: Never Used  Vaping Use  . Vaping Use: Never used  Substance and Sexual Activity  . Alcohol use: Yes    Comment: occ  . Drug use: No  . Sexual activity: Yes    Birth control/protection: None  Other Topics Concern  . Not on file  Social History Narrative  . Not on file   Social Determinants of Health   Financial Resource Strain:   . Difficulty of Paying Living Expenses: Not on file  Food Insecurity:   . Worried  About Running Out of Food in the Last Year: Not on file  . Ran Out of Food in the Last Year: Not on file  Transportation Needs:   . Lack of Transportation (Medical): Not on file  . Lack of Transportation (Non-Medical): Not on file  Physical Activity:   . Days of Exercise per Week: Not on file  . Minutes of Exercise per Session: Not on file  Stress:   . Feeling of Stress : Not on file  Social Connections:   . Frequency of Communication with Friends and Family: Not on file  . Frequency of Social Gatherings with Friends and Family: Not on file  . Attends Religious Services: Not on file  . Active Member of Clubs or Organizations: Not on file  . Attends Banker Meetings: Not on file  . Marital Status: Not on file  Intimate Partner Violence:   . Fear of Current or Ex-Partner: Not on file  . Emotionally  Abused: Not on file  . Physically Abused: Not on file  . Sexually Abused: Not on file   Family History  Problem Relation Age of Onset  . Heart murmur Mother   . Hypertension Mother   . Diabetes Mother   . Thyroid disease Mother   . Heart murmur Sister   . Urticaria Sister   . Cancer Maternal Grandmother        breast   . Hypertension Maternal Grandmother   . Heart murmur Sister   . Hypertension Father   . Urticaria Father   . Asthma Father   . Hypertension Maternal Grandfather   . Thyroid disease Maternal Aunt   . Hypertension Maternal Aunt   . Diabetes Maternal Aunt   . Diabetes Maternal Uncle   . Hypertension Maternal Uncle   . Diabetes Paternal Aunt   . Hypertension Paternal Aunt   . Hypertension Paternal Uncle   . Diabetes Paternal Uncle   . Seizures Paternal Grandmother   . Stroke Paternal Grandmother   . Hypertension Paternal Grandmother   . Diabetes Paternal Grandfather   . Hypertension Paternal Grandfather   . Angioedema Neg Hx   . Allergic rhinitis Neg Hx   . Atopy Neg Hx   . Eczema Neg Hx   . Immunodeficiency Neg Hx     OBJECTIVE:  Vitals:   04/13/20 1709  BP: 113/77  Pulse: 84  Resp: 17  Temp: 98.2 F (36.8 C)  TempSrc: Oral  SpO2: 98%  Weight: 200 lb (90.7 kg)  Height: 5\' 5"  (1.651 m)    General appearance: alert; well-appearing, nontoxic; speaking in full sentences and tolerating own secretions HEENT: NCAT; Ears: EACs clear, TMs pearly gray; Eyes: PERRL.  EOM grossly intact.Nose: nares patent without rhinorrhea, Throat: oropharynx with mild PND, tonsils mildly erythematous NOT enlarged, uvula midline  Neck: supple without LAD Lungs: unlabored respirations, symmetrical air entry; cough: absent; no respiratory distress; CTAB Heart: regular rate and rhythm.  Skin: warm and dry Psychological: alert and cooperative; normal mood and affect   ASSESSMENT & PLAN:  1. Sore throat     Meds ordered this encounter  Medications  . meloxicam (MOBIC)  15 MG tablet    Sig: Take 1 tablet (15 mg total) by mouth daily.    Dispense:  30 tablet    Refill:  0    Order Specific Question:   Supervising Provider    Answer:   Eustace Moore   Strep negative.  Culture sent.   Declines covid test  Get plenty of rest and push fluids Mobic for pain Use OTC zyrtec for nasal congestion, runny nose, and/or sore throat Use OTC flonase for nasal congestion and runny nose Use medications daily for symptom relief Use OTC medications like ibuprofen or tylenol as needed fever or pain Call or go to the ED if you have any new or worsening symptoms such as fever, cough, shortness of breath, chest tightness, chest pain, turning blue, changes in mental status, etc...   Reviewed expectations re: course of current medical issues. Questions answered. Outlined signs and symptoms indicating need for more acute intervention. Patient verbalized understanding. After Visit Summary given.         Rennis Harding, PA-C 04/13/20 1727

## 2020-04-15 DIAGNOSIS — F321 Major depressive disorder, single episode, moderate: Secondary | ICD-10-CM | POA: Diagnosis not present

## 2020-04-16 LAB — CULTURE, GROUP A STREP (THRC)

## 2020-04-21 DIAGNOSIS — F321 Major depressive disorder, single episode, moderate: Secondary | ICD-10-CM | POA: Diagnosis not present

## 2020-05-26 DIAGNOSIS — Z3200 Encounter for pregnancy test, result unknown: Secondary | ICD-10-CM | POA: Diagnosis not present

## 2020-05-26 DIAGNOSIS — Z3689 Encounter for other specified antenatal screening: Secondary | ICD-10-CM | POA: Diagnosis not present

## 2020-05-27 ENCOUNTER — Other Ambulatory Visit: Payer: Self-pay

## 2020-05-27 ENCOUNTER — Ambulatory Visit
Admission: EM | Admit: 2020-05-27 | Discharge: 2020-05-27 | Disposition: A | Discharge status: Home / Self Care | Payer: Federal, State, Local not specified - PPO | Attending: Emergency Medicine | Admitting: Emergency Medicine

## 2020-05-27 DIAGNOSIS — J029 Acute pharyngitis, unspecified: Secondary | ICD-10-CM

## 2020-05-27 LAB — POCT RAPID STREP A (OFFICE): Rapid Strep A Screen: NEGATIVE

## 2020-05-27 NOTE — ED Triage Notes (Signed)
Pt presents with sore throat for past 2 days, denies fever or other symptoms, pain only when swallowing

## 2020-05-27 NOTE — Discharge Instructions (Signed)
Strep test negative, will send out for culture and we will call you with results Get plenty of rest and push fluids OTC throat lozenges such as Halls, Vicks or Cepacol to soothe throat Drink warm or cool liquids, use throat lozenges, or popsicles to help alleviate symptoms Take OTC ibuprofen or tylenol as needed for pain Follow up with PCP if symptoms persists Return or go to ER if patient has any new or worsening symptoms such as fever, chills, nausea, vomiting, worsening sore throat, cough, abdominal pain, chest pain, changes in bowel or bladder habits, etc..Marland Kitchen

## 2020-05-27 NOTE — ED Provider Notes (Signed)
[facility] CARE CENTER   [id number] 05/27/20 Arrival Time: 1950  DT:OIZT THROAT  SUBJECTIVE: History from: patient and family.  Yvonne Stone is a 29 y.o. female who is expecting presented to the urgent care with a complaint of sore throat for the past 2 days.  Denies sick exposure to strep, flu or mono, or precipitating event.  Has tried OTC medication without relief.  Symptoms are made worse with swallowing, but tolerating liquids and own secretions without difficulty.  Denies previous symptoms in the past.   Denies fever, chills, fatigue, ear pain, sinus pain, rhinorrhea, nasal congestion, cough, SOB, wheezing, chest pain, nausea, rash, changes in bowel or bladder habits.     ROS: As per HPI.  All other pertinent ROS negative.     Past Medical History:  Diagnosis Date   Depression    Herpes    genital   History of cesarean section 12/16/2015   Mental disorder    postpartum depression   Migraines    Supervision of normal pregnancy 12/16/2015    [facility] Care at   8+5 weeks FOB  Corwin Levins 38 yo bm 11th child Dating By  [place] Pap  12/16/15 GC/CT Initial:                36+wks: Genetic Screen NT/IT:  CF screen  Anatomic [place]  Flu vaccine  Tdap Recommended ~ 28wks Glucose Screen  2 hr GBS  Feed Preference  Contraception  Circumcision  Childbirth Classes  Pediatrician     UTI (urinary tract infection) 12/10/2018   + Ecoli, tx'd 7/6 with septra ds    Vaginal Pap smear, abnormal    Past Surgical History:  Procedure Laterality Date   CESAREAN SECTION     CESAREAN SECTION N/A 07/15/2016   Procedure: REPEAT CESAREAN SECTION;  Surgeon: Tilda Burrow, MD;  Location: [facility] BIRTHING SUITES;  Service: Obstetrics;  Laterality: N/A;   No Known Allergies No current facility-administered medications on file prior to encounter.   Current Outpatient Medications on File Prior to Encounter  Medication Sig Dispense Refill   acetaminophen (TYLENOL) 500 MG tablet Take 500 mg  by mouth every 6 (six) hours as needed.     azelastine (ASTELIN) 0.1 % nasal spray Place 2 sprays into both nostrils 2 (two) times daily as needed for rhinitis. Use in each nostril as directed 30 mL 12   AZO-CRANBERRY PO Take by mouth. (Patient not taking: Reported on 04/03/2020)     fluconazole (DIFLUCAN) 200 MG tablet Take 200 mg by mouth daily.     meloxicam (MOBIC) 15 MG tablet Take 1 tablet (15 mg total) by mouth daily. 30 tablet 0   metroNIDAZOLE (FLAGYL) 500 MG tablet Take 1 tablet (500 mg total) by mouth 2 (two) times daily. 14 tablet 0   Probiotic Product (PROBIOTIC DAILY PO) Take by mouth.     Social History   Socioeconomic History   Marital status: Married    Spouse name: Not on file   Number of children: 2   Years of education: Not on file   Highest education level: Not on file  Occupational History   Not on file  Tobacco Use   Smoking status: Never Smoker   Smokeless tobacco: Never Used  Vaping Use   Vaping Use: Never used  Substance and Sexual Activity   Alcohol use: Yes    Comment: occ   Drug use: No   Sexual activity: Yes    Birth control/protection: None  Other Topics Concern   Not on file  Social History Narrative   Not on file   Social Determinants of Health   Financial Resource Strain: Not on file  Food Insecurity: Not on file  Transportation Needs: Not on file  Physical Activity: Not on file  Stress: Not on file  Social Connections: Not on file  Intimate Partner Violence: Not on file   Family History  Problem Relation Age of Onset   Heart murmur Mother    Hypertension Mother    Diabetes Mother    Thyroid disease Mother    Heart murmur Sister    Urticaria Sister    Cancer Maternal Grandmother        breast    Hypertension Maternal Grandmother    Heart murmur Sister    Hypertension Father    Urticaria Father    Asthma Father    Hypertension Maternal Grandfather    Thyroid disease Maternal Aunt     Hypertension Maternal Aunt    Diabetes Maternal Aunt    Diabetes Maternal Uncle    Hypertension Maternal Uncle    Diabetes Paternal Aunt    Hypertension Paternal Aunt    Hypertension Paternal Uncle    Diabetes Paternal Uncle    Seizures Paternal Grandmother    Stroke Paternal Grandmother    Hypertension Paternal Grandmother    Diabetes Paternal Grandfather    Hypertension Paternal Grandfather    Angioedema Neg Hx    Allergic rhinitis Neg Hx    Atopy Neg Hx    Eczema Neg Hx    Immunodeficiency Neg Hx     OBJECTIVE:  Vitals:   05/27/20 0854  BP: 111/75  Pulse: 74  Resp: 18  Temp: 98.4 F (36.9 C)  SpO2: 97%     General appearance: alert; appears fatigued, but nontoxic, speaking in full sentences and managing own secretions HEENT: NCAT; Ears: EACs clear, TMs pearly gray with visible cone of light, without erythema; Eyes: PERRL, EOMI grossly; Nose: no obvious rhinorrhea; Throat: oropharynx clear, tonsils 1+ and mildly erythematous without white tonsillar exudates, uvula midline Neck: supple without LAD Lungs: CTA bilaterally without adventitious breath sounds; cough absent Heart: regular rate and rhythm.  Radial pulses 2+ symmetrical bilaterally Skin: warm and dry Psychological: alert and cooperative; normal mood and affect  LABS: Results for orders placed or performed during the hospital encounter of 05/27/20 (from the past 24 hour(s))  POCT rapid strep A     Status: None   Collection Time: 05/27/20  9:12 AM  Result Value Ref Range   Rapid Strep A Screen Negative Negative     ASSESSMENT & PLAN:  1. Sore throat     No orders of the defined types were placed in this encounter.  Discharge instructions  Strep test negative, will send out for culture and we will call you with results Get plenty of rest and push fluids OTC throat lozenges such as Halls, Vicks or Cepacol to soothe throat Drink warm or cool liquids, use throat lozenges, or popsicles  to help alleviate symptoms Take OTC ibuprofen or tylenol as needed for pain Follow up with PCP if symptoms persists Return or go to ER if patient has any new or worsening symptoms such as fever, chills, nausea, vomiting, worsening sore throat, cough, abdominal pain, chest pain, changes in bowel or bladder habits, etc...  Reviewed expectations re: course of current medical issues. Questions answered. Outlined signs and symptoms indicating need for more acute intervention. Patient verbalized understanding. After Visit  Summary given.         Durward Parcel, FNP 05/27/20 [id number]

## 2020-05-30 ENCOUNTER — Emergency Department (HOSPITAL_COMMUNITY)
Admission: EM | Admit: 2020-05-30 | Discharge: 2020-05-30 | Disposition: A | Discharge status: Home / Self Care | Payer: Federal, State, Local not specified - PPO | Attending: Emergency Medicine | Admitting: Emergency Medicine

## 2020-05-30 ENCOUNTER — Encounter (HOSPITAL_COMMUNITY): Payer: Self-pay | Admitting: *Deleted

## 2020-05-30 DIAGNOSIS — H6123 Impacted cerumen, bilateral: Secondary | ICD-10-CM | POA: Diagnosis not present

## 2020-05-30 DIAGNOSIS — Z20822 Contact with and (suspected) exposure to covid-19: Secondary | ICD-10-CM | POA: Diagnosis not present

## 2020-05-30 DIAGNOSIS — R490 Dysphonia: Secondary | ICD-10-CM | POA: Insufficient documentation

## 2020-05-30 LAB — RESP PANEL BY RT-PCR (FLU A&B, COVID) ARPGX2
Influenza A by PCR: NEGATIVE
Influenza B by PCR: NEGATIVE
SARS Coronavirus 2 by RT PCR: NEGATIVE

## 2020-05-30 LAB — CULTURE, GROUP A STREP (THRC)

## 2020-05-30 NOTE — Discharge Instructions (Addendum)
You have been seen here for URI like symptoms.  I recommend taking Tylenol for fever control and ibuprofen for pain control please follow dosing on the back of bottle.  I recommend staying hydrated and if you do not an appetite, I recommend soups as this will provide you with fluids and calories.  Your Covid test is pending I recommend self quarantine until you get your results back on MyChart.  If you are Covid positive you must self quarantine for 10 days starting on symptom onset.  I would like you to contact "post Covid care" as they will provide you with information how to manage your Covid symptoms.  Come back to the emergency department if you develop chest pain, shortness of breath, severe abdominal pain, uncontrolled nausea, vomiting, diarrhea.

## 2020-05-30 NOTE — ED Triage Notes (Signed)
Woke up this am and had a hoarse voice, denies pain

## 2020-05-30 NOTE — ED Provider Notes (Signed)
[facility] EMERGENCY DEPARTMENT Provider Note   CSN: [id number] Arrival date & time: 05/30/20  6270     History Chief Complaint  Patient presents with  . Hoarse    Yvonne Stone is a 29 y.o. female.  HPI    Patient with no significant medical history presents to the emergency department chief complaint of hoarse throat. Patient states over the last few days she has had some throat pain which is since resolved but this morning she woke up with a change in her voice and has a slight somewhat productive cough.. She denies fevers, chills, nasal congestion, ear pain, chest pain, shortness of breath, abdominal pain, nausea, vomiting, diarrhea, pedal edema. She denies any alleviating factors. Patient was seen at urgent care few days ago where they performed a strep test which came back negative, she also a negative strep culture. She is on max against COVID-19, she denies any recent sick contacts, no recent travels, is not immunocompromise.  Past Medical History:  Diagnosis Date  . Depression   . Herpes    genital  . History of cesarean section 12/16/2015  . Mental disorder    postpartum depression  . Migraines   . Supervision of normal pregnancy 12/16/2015    [facility] at   8+5 weeks FOB  Corwin Levins 50 yo bm 11th child Dating By  [place] Pap  12/16/15 GC/CT Initial:                36+wks: Genetic Screen NT/IT:  CF screen  Anatomic [place]  Flu vaccine  Tdap Recommended ~ 28wks Glucose Screen  2 hr GBS  Feed Preference  Contraception  Circumcision  Childbirth Classes  Pediatrician    . UTI (urinary tract infection) 12/10/2018   + Ecoli, tx'd 7/6 with septra ds   . Vaginal Pap smear, abnormal     Patient Active Problem List   Diagnosis Date Noted  . Seasonal and perennial allergic rhinitis 11/06/2019  . UTI (urinary tract infection) 12/10/2018  . Depression 04/11/2016  . Umbilical hernia 03/28/2016  . History of postpartum depression 02/15/2016  . LGSIL pap 01/2013  and 08/07/13 01/28/2014  . 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 Heart murmur 01/23/2014  . Migraines     Past Surgical History:  Procedure Laterality Date  . CESAREAN SECTION    . CESAREAN SECTION N/A 07/15/2016   Procedure: REPEAT CESAREAN SECTION;  Surgeon: Tilda Burrow, MD;  Location: [facility] BIRTHING SUITES;  Service: Obstetrics;  Laterality: N/A;     OB History    Gravida  3   Para  2   Term  2   Preterm      AB  1   Living  2     SAB  1   IAB      Ectopic      Multiple  0   Live Births  2           Family History  Problem Relation Age of Onset  . Heart murmur Mother   . Hypertension Mother   . Diabetes Mother   . Thyroid disease Mother   . Heart murmur Sister   . Urticaria Sister   . Cancer Maternal Grandmother        breast   . Hypertension Maternal Grandmother   . Heart murmur Sister   . Hypertension Father   . Urticaria Father   . Asthma Father   . Hypertension Maternal Grandfather   . Thyroid  disease Maternal Aunt   . Hypertension Maternal Aunt   . Diabetes Maternal Aunt   . Diabetes Maternal Uncle   . Hypertension Maternal Uncle   . Diabetes Paternal Aunt   . Hypertension Paternal Aunt   . Hypertension Paternal Uncle   . Diabetes Paternal Uncle   . Seizures Paternal Grandmother   . Stroke Paternal Grandmother   . Hypertension Paternal Grandmother   . Diabetes Paternal Grandfather   . Hypertension Paternal Grandfather   . Angioedema Neg Hx   . Allergic rhinitis Neg Hx   . Atopy Neg Hx   . Eczema Neg Hx   . Immunodeficiency Neg Hx     Social History   Tobacco Use  . Smoking status: Never Smoker  . Smokeless tobacco: Never Used  Vaping Use  . Vaping Use: Never used  Substance Use Topics  . Alcohol use: Yes    Comment: occ  . Drug use: No    Home Medications Prior to Admission medications   Medication Sig Start Date End Date Taking? Authorizing Provider  acetaminophen (TYLENOL) 500 MG tablet Take 500 mg by mouth every 6 (six) hours  as needed.    [provider]  azelastine (ASTELIN) 0.1 % nasal spray Place 2 sprays into both nostrils 2 (two) times daily as needed for rhinitis. Use in each nostril as directed 11/06/19   Alfonse Spruce, MD  AZO-CRANBERRY PO Take by mouth. Patient not taking: Reported on 04/03/2020    [provider]  fluconazole (DIFLUCAN) 200 MG tablet Take 200 mg by mouth daily. 03/24/20   [provider]  meloxicam (MOBIC) 15 MG tablet Take 1 tablet (15 mg total) by mouth daily. 04/13/20   Wurst, [place], PA-C  metroNIDAZOLE (FLAGYL) 500 MG tablet Take 1 tablet (500 mg total) by mouth 2 (two) times daily. 03/24/20   Avegno, Zachery Dakins, FNP  Probiotic Product (PROBIOTIC DAILY PO) Take by mouth.    [provider]    Allergies    Patient has no known allergies.  Review of Systems   Review of Systems  Constitutional: Negative for chills and fever.  HENT: Positive for voice change. Negative for congestion, sore throat and trouble swallowing.   Respiratory: Positive for cough. Negative for shortness of breath.   Cardiovascular: Negative for chest pain.  Gastrointestinal: Negative for abdominal pain, diarrhea, nausea and vomiting.  Genitourinary: Negative for enuresis.  Musculoskeletal: Negative for back pain.  Skin: Negative for rash.  Neurological: Negative for headaches.  Hematological: Does not bruise/bleed easily.    Physical Exam Updated Vital Signs BP 125/81   Pulse 87   Temp 98.3 F (36.8 C)   Resp 20   SpO2 100%   Physical Exam Vitals and nursing note reviewed.  Constitutional:      General: She is not in acute distress.    Appearance: She is not ill-appearing.  HENT:     Head: Normocephalic and atraumatic.     Right Ear: There is impacted cerumen.     Left Ear: There is impacted cerumen.     Nose: No congestion.     Comments: Patient has noted left sided erythematous turbinates, nasal passages are both patent.    Mouth/Throat:      Mouth: Mucous membranes are moist.     Pharynx: Oropharynx is clear. No oropharyngeal exudate or posterior oropharyngeal erythema.     Comments: Tongue and uvula are both midline, no erythema or exudates noted in posterior pharynx, patient controlling secretions without  difficulty Eyes:     Extraocular Movements: Extraocular movements intact.     Conjunctiva/sclera: Conjunctivae normal.  Cardiovascular:     Rate and Rhythm: Normal rate and regular rhythm.     Pulses: Normal pulses.     Heart sounds: No murmur heard. No friction rub. No gallop.   Pulmonary:     Effort: No respiratory distress.     Breath sounds: No wheezing, rhonchi or rales.  Musculoskeletal:     Cervical back: Normal range of motion. No tenderness.     Comments: Patient is moving all 4 extremities at difficulty.  Skin:    General: Skin is warm and dry.  Neurological:     Mental Status: She is alert.     Comments: Patient's had no difficulty with word finding.  Psychiatric:        Mood and Affect: Mood normal.     ED Results / Procedures / Treatments   Labs (all labs ordered are listed, but only abnormal results are displayed) Labs Reviewed  RESP PANEL BY RT-PCR (FLU A&B, COVID) [id number]    EKG None  Radiology No results found.  Procedures Procedures (including critical care time)  Medications Ordered in ED Medications - No data to display  ED Course  I have reviewed the triage vital signs and the nursing notes.  Pertinent labs & imaging results that were available during my care of the patient were reviewed by me and considered in my medical decision making (see chart for details).    MDM Rules/Calculators/A&P                          Patient presents with a hoarse voice. She is alert, does not appear in acute distress, vital signs reassuring.we will order respiratory panel for further evaluation Will defer further strep test or culture at this time as she had it 2 days ago both of which were  negative  Respiratory panel is negative  I have low suspicion for peritonsillar abscess, retropharyngeal abscess, or Ludwig angina as oropharynx was visualized tongue and uvula were both midline, there is no exudates, erythema or edema noted in the posterior pillars or on/ around tonsils. Low suspicion for periorbital or orbital cellulitis as patient face had no erythematous, patient EOMs were intact, he had no pain with eye movement.  Low suspicion for strep as patient had negative strep test and culture performed few days ago, there is no signs of infection noted on my exam.  Low suspicion for otitis externa or otitis media as patient has no ear pain.  I suspect patient may be suffering from viral laryngitis, will recommend over-the-counter pain medications and follow-up with PCP for further evaluation.  Vital signs have remained stable, no indication for hospital admission.  Patient given at home care as well strict return precautions.  Patient verbalized that they understood agreed to said plan.        Final Clinical Impression(s) / ED Diagnoses Final diagnoses:  Hoarseness    Rx / DC Orders ED Discharge Orders    None       Carroll Sage, PA-C 05/30/20 1310    Pricilla Loveless, MD 05/30/20 1441

## 2020-06-01 DIAGNOSIS — O0901 Supervision of pregnancy with history of infertility, first trimester: Secondary | ICD-10-CM | POA: Diagnosis not present

## 2020-06-01 DIAGNOSIS — Z3A01 Less than 8 weeks gestation of pregnancy: Secondary | ICD-10-CM | POA: Diagnosis not present

## 2020-06-03 DIAGNOSIS — O0901 Supervision of pregnancy with history of infertility, first trimester: Secondary | ICD-10-CM | POA: Diagnosis not present

## 2020-06-03 DIAGNOSIS — Z3A01 Less than 8 weeks gestation of pregnancy: Secondary | ICD-10-CM | POA: Diagnosis not present

## 2020-06-04 DIAGNOSIS — O0281 Inappropriate change in quantitative human chorionic gonadotropin (hCG) in early pregnancy: Secondary | ICD-10-CM | POA: Diagnosis not present

## 2020-06-04 DIAGNOSIS — Z3201 Encounter for pregnancy test, result positive: Secondary | ICD-10-CM | POA: Diagnosis not present

## 2020-06-08 DIAGNOSIS — Z3A01 Less than 8 weeks gestation of pregnancy: Secondary | ICD-10-CM | POA: Diagnosis not present

## 2020-06-08 DIAGNOSIS — O0281 Inappropriate change in quantitative human chorionic gonadotropin (hCG) in early pregnancy: Secondary | ICD-10-CM | POA: Diagnosis not present

## 2020-06-09 DIAGNOSIS — O0281 Inappropriate change in quantitative human chorionic gonadotropin (hCG) in early pregnancy: Secondary | ICD-10-CM | POA: Diagnosis not present

## 2020-06-09 DIAGNOSIS — O029 Abnormal product of conception, unspecified: Secondary | ICD-10-CM | POA: Diagnosis not present

## 2020-06-10 DIAGNOSIS — O021 Missed abortion: Secondary | ICD-10-CM | POA: Diagnosis not present

## 2020-06-10 DIAGNOSIS — O039 Complete or unspecified spontaneous abortion without complication: Secondary | ICD-10-CM | POA: Diagnosis not present

## 2020-06-17 DIAGNOSIS — O039 Complete or unspecified spontaneous abortion without complication: Secondary | ICD-10-CM | POA: Diagnosis not present

## 2020-07-07 DIAGNOSIS — Z0000 Encounter for general adult medical examination without abnormal findings: Secondary | ICD-10-CM | POA: Diagnosis not present

## 2020-07-07 DIAGNOSIS — N3946 Mixed incontinence: Secondary | ICD-10-CM | POA: Diagnosis not present

## 2020-07-07 DIAGNOSIS — Z6836 Body mass index (BMI) 36.0-36.9, adult: Secondary | ICD-10-CM | POA: Diagnosis not present

## 2020-07-07 DIAGNOSIS — N979 Female infertility, unspecified: Secondary | ICD-10-CM | POA: Diagnosis not present

## 2020-09-04 DIAGNOSIS — F10129 Alcohol abuse with intoxication, unspecified: Secondary | ICD-10-CM | POA: Diagnosis not present

## 2020-09-04 DIAGNOSIS — S199XXA Unspecified injury of neck, initial encounter: Secondary | ICD-10-CM | POA: Diagnosis not present

## 2020-09-04 DIAGNOSIS — S50812A Abrasion of left forearm, initial encounter: Secondary | ICD-10-CM | POA: Diagnosis not present

## 2020-09-04 DIAGNOSIS — J3489 Other specified disorders of nose and nasal sinuses: Secondary | ICD-10-CM | POA: Diagnosis not present

## 2020-09-04 DIAGNOSIS — Y902 Blood alcohol level of 40-59 mg/100 ml: Secondary | ICD-10-CM | POA: Diagnosis not present

## 2020-09-04 DIAGNOSIS — J322 Chronic ethmoidal sinusitis: Secondary | ICD-10-CM | POA: Diagnosis not present

## 2020-09-04 DIAGNOSIS — R9431 Abnormal electrocardiogram [ECG] [EKG]: Secondary | ICD-10-CM | POA: Diagnosis not present

## 2020-09-04 DIAGNOSIS — R1110 Vomiting, unspecified: Secondary | ICD-10-CM | POA: Diagnosis not present

## 2020-09-04 DIAGNOSIS — R4182 Altered mental status, unspecified: Secondary | ICD-10-CM | POA: Diagnosis not present

## 2020-09-04 DIAGNOSIS — Y92410 Unspecified street and highway as the place of occurrence of the external cause: Secondary | ICD-10-CM | POA: Diagnosis not present

## 2020-09-04 DIAGNOSIS — S069X9A Unspecified intracranial injury with loss of consciousness of unspecified duration, initial encounter: Secondary | ICD-10-CM | POA: Diagnosis not present

## 2020-09-07 ENCOUNTER — Encounter: Payer: Self-pay | Admitting: Emergency Medicine

## 2020-09-07 ENCOUNTER — Ambulatory Visit
Admission: EM | Admit: 2020-09-07 | Discharge: 2020-09-07 | Disposition: A | Discharge status: Home / Self Care | Payer: Federal, State, Local not specified - PPO | Attending: Family Medicine | Admitting: Family Medicine

## 2020-09-07 ENCOUNTER — Other Ambulatory Visit: Payer: Self-pay

## 2020-09-07 DIAGNOSIS — N760 Acute vaginitis: Secondary | ICD-10-CM | POA: Insufficient documentation

## 2020-09-07 DIAGNOSIS — B9689 Other specified bacterial agents as the cause of diseases classified elsewhere: Secondary | ICD-10-CM | POA: Diagnosis not present

## 2020-09-07 DIAGNOSIS — N898 Other specified noninflammatory disorders of vagina: Secondary | ICD-10-CM | POA: Insufficient documentation

## 2020-09-07 DIAGNOSIS — Z113 Encounter for screening for infections with a predominantly sexual mode of transmission: Secondary | ICD-10-CM | POA: Insufficient documentation

## 2020-09-07 MED ORDER — FLUCONAZOLE 150 MG PO TABS: 150.0000 mg | ORAL_TABLET | Freq: Once | ORAL | 0 refills | Status: AC

## 2020-09-07 NOTE — ED Provider Notes (Signed)
[facility]    CSN: [id number] Arrival date & time: 09/07/20  6387      History   Chief Complaint Chief Complaint  Patient presents with  . Vaginal Discharge    HPI Yvonne Stone is a 30 y.o. female.   HPI   Patient presents for vaginal discharge abnormalities. Endorses vaginal odor, increased production of discharge, and changes in color of discharge. Vaginal discomfort not pain or itching. Patient's last menstrual period was 08/06/2020. Recent negative pregnancy test within 5 days per EMR. History of recurrent BV and yeast. No concern  Or known exposure for STI. Past Medical History:  Diagnosis Date  . Depression   . Herpes    genital  . History of cesarean section 12/16/2015  . Mental disorder    postpartum depression  . Migraines   . Supervision of normal pregnancy 12/16/2015    [facility] at   8+5 weeks FOB  Corwin Levins 89 yo bm 11th child Dating By  [place] Pap  12/16/15 GC/CT Initial:                36+wks: Genetic Screen NT/IT:  CF screen  Anatomic [place]  Flu vaccine  Tdap Recommended ~ 28wks Glucose Screen  2 hr GBS  Feed Preference  Contraception  Circumcision  Childbirth Classes  Pediatrician    . UTI (urinary tract infection) 12/10/2018   + Ecoli, tx'd 7/6 with septra ds   . Vaginal Pap smear, abnormal     Patient Active Problem List   Diagnosis Date Noted  . Seasonal and perennial allergic rhinitis 11/06/2019  . UTI (urinary tract infection) 12/10/2018  . Depression 04/11/2016  . Umbilical hernia 03/28/2016  . History of postpartum depression 02/15/2016  . 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 Heart murmur 01/23/2014  . Migraines     Past Surgical History:  Procedure Laterality Date  . CESAREAN SECTION    . CESAREAN SECTION N/A 07/15/2016   Procedure: REPEAT CESAREAN SECTION;  Surgeon: Tilda Burrow, MD;  Location: [facility] BIRTHING SUITES;  Service: Obstetrics;  Laterality: N/A;    OB History     Gravida  3   Para  2   Term  2   Preterm      AB  1   Living  2     SAB  1   IAB      Ectopic      Multiple  0   Live Births  2            Home Medications    Prior to Admission medications   Medication Sig Start Date End Date Taking? Authorizing Provider  fluconazole (DIFLUCAN) 150 MG tablet Take 1 tablet (150 mg total) by mouth once for 1 dose. Repeat if needed 09/07/20 09/07/20 Yes Bing Neighbors, FNP  acetaminophen (TYLENOL) 500 MG tablet Take 500 mg by mouth every 6 (six) hours as needed.    [provider]  azelastine (ASTELIN) 0.1 % nasal spray Place 2 sprays into both nostrils 2 (two) times daily as needed for rhinitis. Use in each nostril as directed 11/06/19   Alfonse Spruce, MD  AZO-CRANBERRY PO Take by mouth. Patient not taking: Reported on 04/03/2020    [provider]  meloxicam (MOBIC) 15 MG tablet Take 1 tablet (15 mg total) by mouth daily. 04/13/20   Wurst, [place], PA-C  Probiotic Product (PROBIOTIC DAILY PO) Take by mouth.  [provider]    Family History Family History  Problem Relation Age of Onset  . Heart murmur Mother   . Hypertension Mother   . Diabetes Mother   . Thyroid disease Mother   . Heart murmur Sister   . Urticaria Sister   . Cancer Maternal Grandmother        breast   . Hypertension Maternal Grandmother   . Heart murmur Sister   . Hypertension Father   . Urticaria Father   . Asthma Father   . Hypertension Maternal Grandfather   . Thyroid disease Maternal Aunt   . Hypertension Maternal Aunt   . Diabetes Maternal Aunt   . Diabetes Maternal Uncle   . Hypertension Maternal Uncle   . Diabetes Paternal Aunt   . Hypertension Paternal Aunt   . Hypertension Paternal Uncle   . Diabetes Paternal Uncle   . Seizures Paternal Grandmother   . Stroke Paternal Grandmother   . Hypertension Paternal Grandmother   . Diabetes Paternal Grandfather   . Hypertension Paternal Grandfather   .  Angioedema Neg Hx   . Allergic rhinitis Neg Hx   . Atopy Neg Hx   . Eczema Neg Hx   . Immunodeficiency Neg Hx     Social History Social History   Tobacco Use  . Smoking status: Never Smoker  . Smokeless tobacco: Never Used  Vaping Use  . Vaping Use: Never used  Substance Use Topics  . Alcohol use: Yes    Comment: occ  . Drug use: No     Allergies   Patient has no known allergies.   Review of Systems Review of Systems  Pertinent negatives listed in HPI Physical Exam Triage Vital Signs ED Triage Vitals  Enc Vitals Group     BP 09/07/20 0936 111/67     Pulse Rate 09/07/20 0936 67     Resp 09/07/20 0936 18     Temp 09/07/20 0936 97.9 F (36.6 C)     Temp Source 09/07/20 0936 Oral     SpO2 09/07/20 0936 98 %     Weight --      Height --      Head Circumference --      Peak Flow --      Pain Score 09/07/20 0935 0     Pain Loc --      Pain Edu? --      Excl. in GC? --    No data found.  Updated Vital Signs BP 111/67 (BP Location: Right Arm)   Pulse 67   Temp 97.9 F (36.6 C) (Oral)   Resp 18   LMP 08/06/2020   SpO2 98%   Visual Acuity Right Eye Distance:   Left Eye Distance:   Bilateral Distance:    Right Eye Near:   Left Eye Near:    Bilateral Near:     Physical Exam General appearance: alert, well developed, well nourished, cooperative  Head: Normocephalic, without obvious abnormality, atraumatic Respiratory: Respirations even and unlabored, normal respiratory rate Heart: rate and rhythm normal. No gallop or murmurs noted on exam  Abdomen: BS +, no distention, no rebound tenderness, or no mass Extremities: No gross deformities Skin: Skin color, texture, turgor normal. No rashes seen  Psych: Appropriate mood and affect. Neurologic: GCS 15, normal coordination, normal gait Vaginal cytology self collected   UC Treatments / Results  Labs (all labs ordered are listed, but only abnormal results are displayed) Labs Reviewed  CERVICOVAGINAL  ANCILLARY ONLY  EKG   Radiology No results found.  Procedures Procedures (including critical care time)  Medications Ordered in UC Medications - No data to display  Initial Impression / Assessment and Plan / UC Course  I have reviewed the triage vital signs and the nursing notes.  Pertinent labs & imaging results that were available during my care of the patient were reviewed by me and considered in my medical decision making (see chart for details).    Vaginal cytology pending. Given history of recurrent BV and yeast, covering with metronidazole and diflucan.  Final Clinical Impressions(s) / UC Diagnoses   Final diagnoses:  Vaginitis and vulvovaginitis   Discharge Instructions   None    ED Prescriptions    Medication Sig Dispense Auth. Provider   fluconazole (DIFLUCAN) 150 MG tablet Take 1 tablet (150 mg total) by mouth once for 1 dose. Repeat if needed 2 tablet Bing Neighbors, FNP     PDMP not reviewed this encounter.   Bing Neighbors, FNP 09/07/20 1017

## 2020-09-07 NOTE — ED Triage Notes (Signed)
Vaginal discharge and odor for a few weeks.

## 2020-09-08 ENCOUNTER — Telehealth (HOSPITAL_COMMUNITY): Payer: Self-pay | Admitting: Emergency Medicine

## 2020-09-08 ENCOUNTER — Telehealth: Payer: Self-pay | Admitting: Emergency Medicine

## 2020-09-08 LAB — CERVICOVAGINAL ANCILLARY ONLY
Bacterial Vaginitis (gardnerella): POSITIVE — AB
Candida Glabrata: NEGATIVE
Candida Vaginitis: NEGATIVE
Chlamydia: NEGATIVE
Comment: NEGATIVE
Comment: NEGATIVE
Comment: NEGATIVE
Comment: NEGATIVE
Comment: NEGATIVE
Comment: NORMAL
Neisseria Gonorrhea: NEGATIVE
Trichomonas: NEGATIVE

## 2020-09-08 MED ORDER — METRONIDAZOLE 500 MG PO TABS
500.0000 mg | ORAL_TABLET | Freq: Two times a day (BID) | ORAL | 0 refills | Status: DC
Start: 2020-09-08 — End: 2021-11-02

## 2020-09-08 MED ORDER — METRONIDAZOLE 500 MG PO TABS: 500.0000 mg | ORAL_TABLET | Freq: Two times a day (BID) | ORAL | 0 refills | Status: DC

## 2020-09-08 MED ORDER — FLUCONAZOLE 150 MG PO TABS: 150.0000 mg | ORAL_TABLET | Freq: Every day | ORAL | 0 refills | Status: DC

## 2020-11-03 DIAGNOSIS — B9689 Other specified bacterial agents as the cause of diseases classified elsewhere: Secondary | ICD-10-CM | POA: Diagnosis not present

## 2020-11-03 DIAGNOSIS — N760 Acute vaginitis: Secondary | ICD-10-CM | POA: Diagnosis not present

## 2020-11-03 DIAGNOSIS — B373 Candidiasis of vulva and vagina: Secondary | ICD-10-CM | POA: Diagnosis not present

## 2021-01-04 DIAGNOSIS — Z3202 Encounter for pregnancy test, result negative: Secondary | ICD-10-CM | POA: Diagnosis not present

## 2021-01-04 DIAGNOSIS — Z7251 High risk heterosexual behavior: Secondary | ICD-10-CM | POA: Diagnosis not present

## 2021-01-04 DIAGNOSIS — N898 Other specified noninflammatory disorders of vagina: Secondary | ICD-10-CM | POA: Diagnosis not present

## 2021-02-28 DIAGNOSIS — N898 Other specified noninflammatory disorders of vagina: Secondary | ICD-10-CM | POA: Diagnosis not present

## 2021-04-02 DIAGNOSIS — R070 Pain in throat: Secondary | ICD-10-CM | POA: Diagnosis not present

## 2021-04-02 DIAGNOSIS — J039 Acute tonsillitis, unspecified: Secondary | ICD-10-CM | POA: Diagnosis not present

## 2021-06-02 DIAGNOSIS — Z3202 Encounter for pregnancy test, result negative: Secondary | ICD-10-CM | POA: Diagnosis not present

## 2021-06-02 DIAGNOSIS — N898 Other specified noninflammatory disorders of vagina: Secondary | ICD-10-CM | POA: Diagnosis not present

## 2021-06-02 DIAGNOSIS — Z7251 High risk heterosexual behavior: Secondary | ICD-10-CM | POA: Diagnosis not present

## 2021-06-02 DIAGNOSIS — R350 Frequency of micturition: Secondary | ICD-10-CM | POA: Diagnosis not present

## 2021-06-02 DIAGNOSIS — N760 Acute vaginitis: Secondary | ICD-10-CM | POA: Diagnosis not present

## 2021-06-29 DIAGNOSIS — N898 Other specified noninflammatory disorders of vagina: Secondary | ICD-10-CM | POA: Diagnosis not present

## 2021-08-15 DIAGNOSIS — R051 Acute cough: Secondary | ICD-10-CM | POA: Diagnosis not present

## 2021-08-15 DIAGNOSIS — R059 Cough, unspecified: Secondary | ICD-10-CM | POA: Diagnosis not present

## 2021-08-15 DIAGNOSIS — Z20818 Contact with and (suspected) exposure to other bacterial communicable diseases: Secondary | ICD-10-CM | POA: Diagnosis not present

## 2021-08-25 DIAGNOSIS — N760 Acute vaginitis: Secondary | ICD-10-CM | POA: Diagnosis not present

## 2021-09-27 DIAGNOSIS — Z7251 High risk heterosexual behavior: Secondary | ICD-10-CM | POA: Diagnosis not present

## 2021-09-27 DIAGNOSIS — N760 Acute vaginitis: Secondary | ICD-10-CM | POA: Diagnosis not present

## 2021-10-03 IMAGING — DX DG CHEST 2V
2 series · 2 of 2 positions shown · non-contrast
Comparison: None.

CLINICAL DATA: Chest pain

EXAM:
CHEST - 2 VIEW

[chest pa]
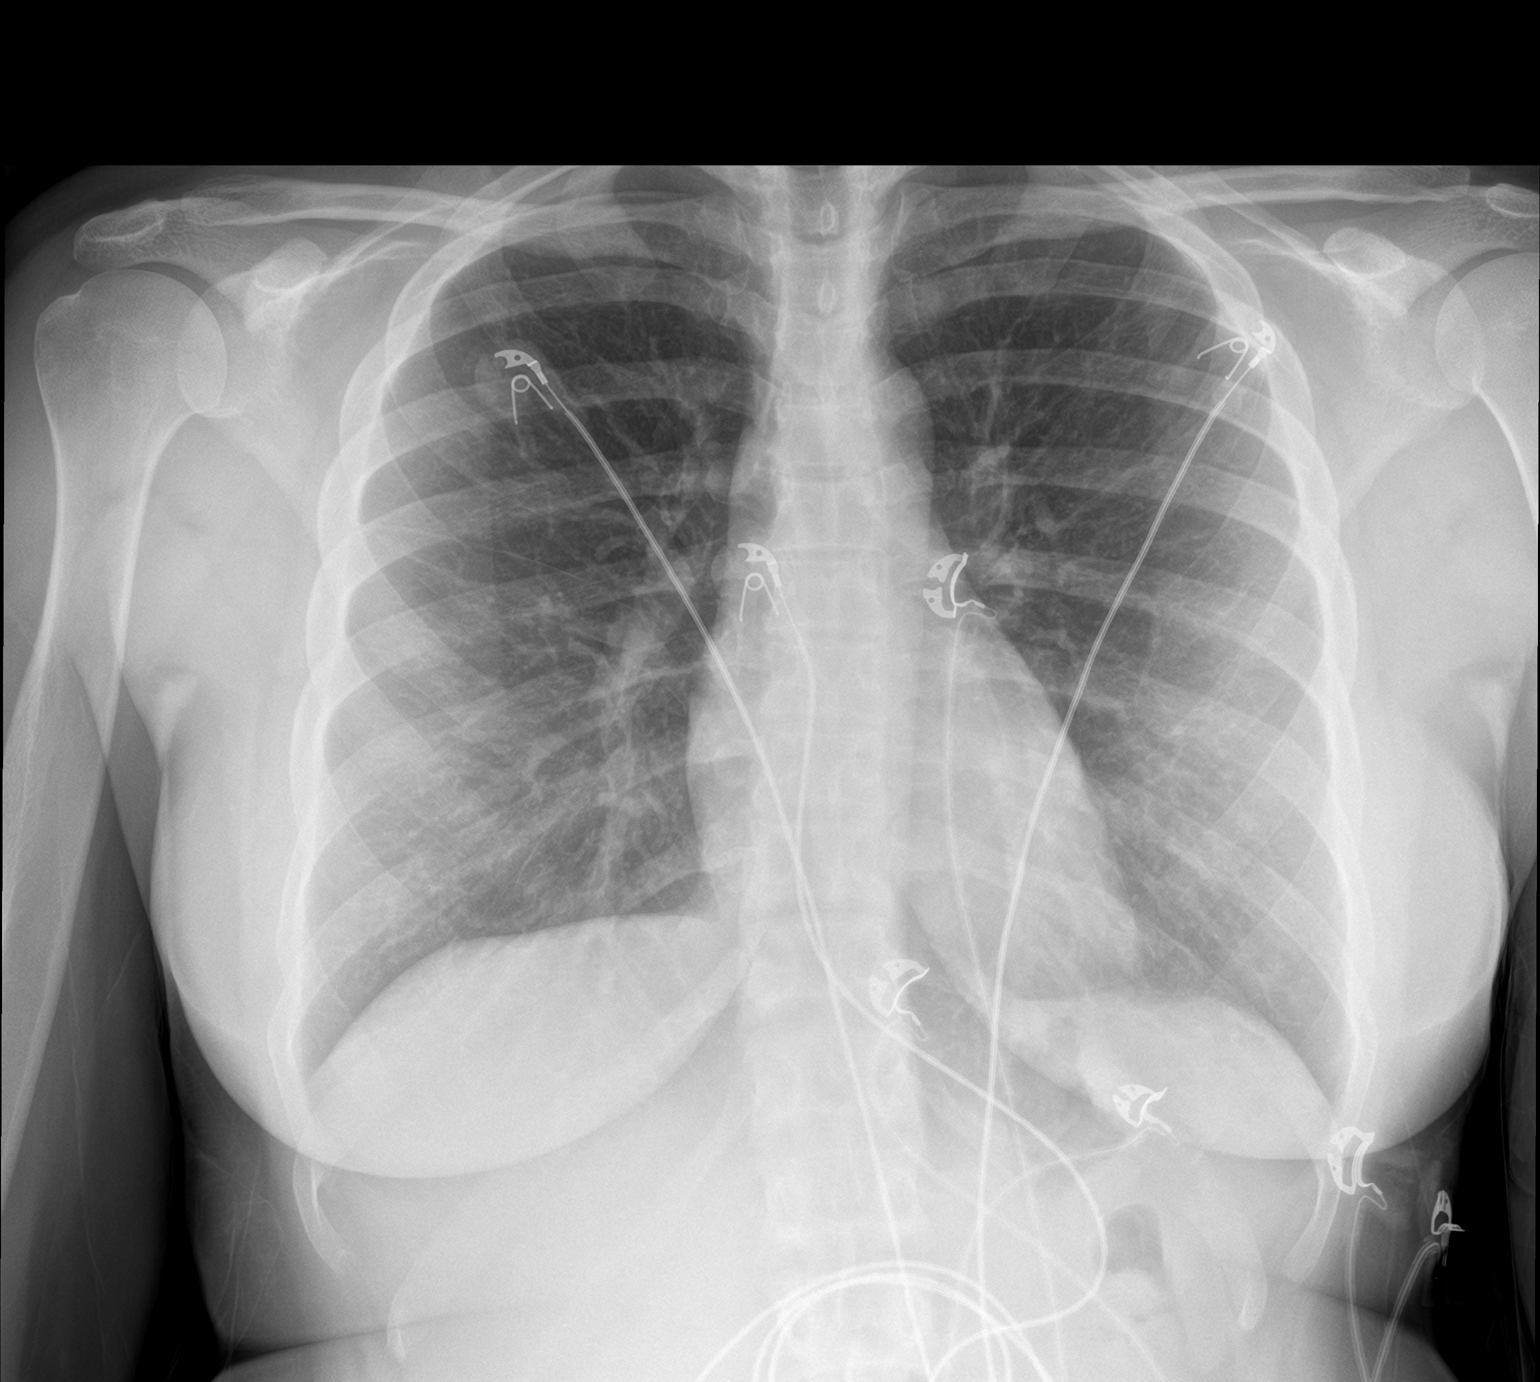

[chest lat]
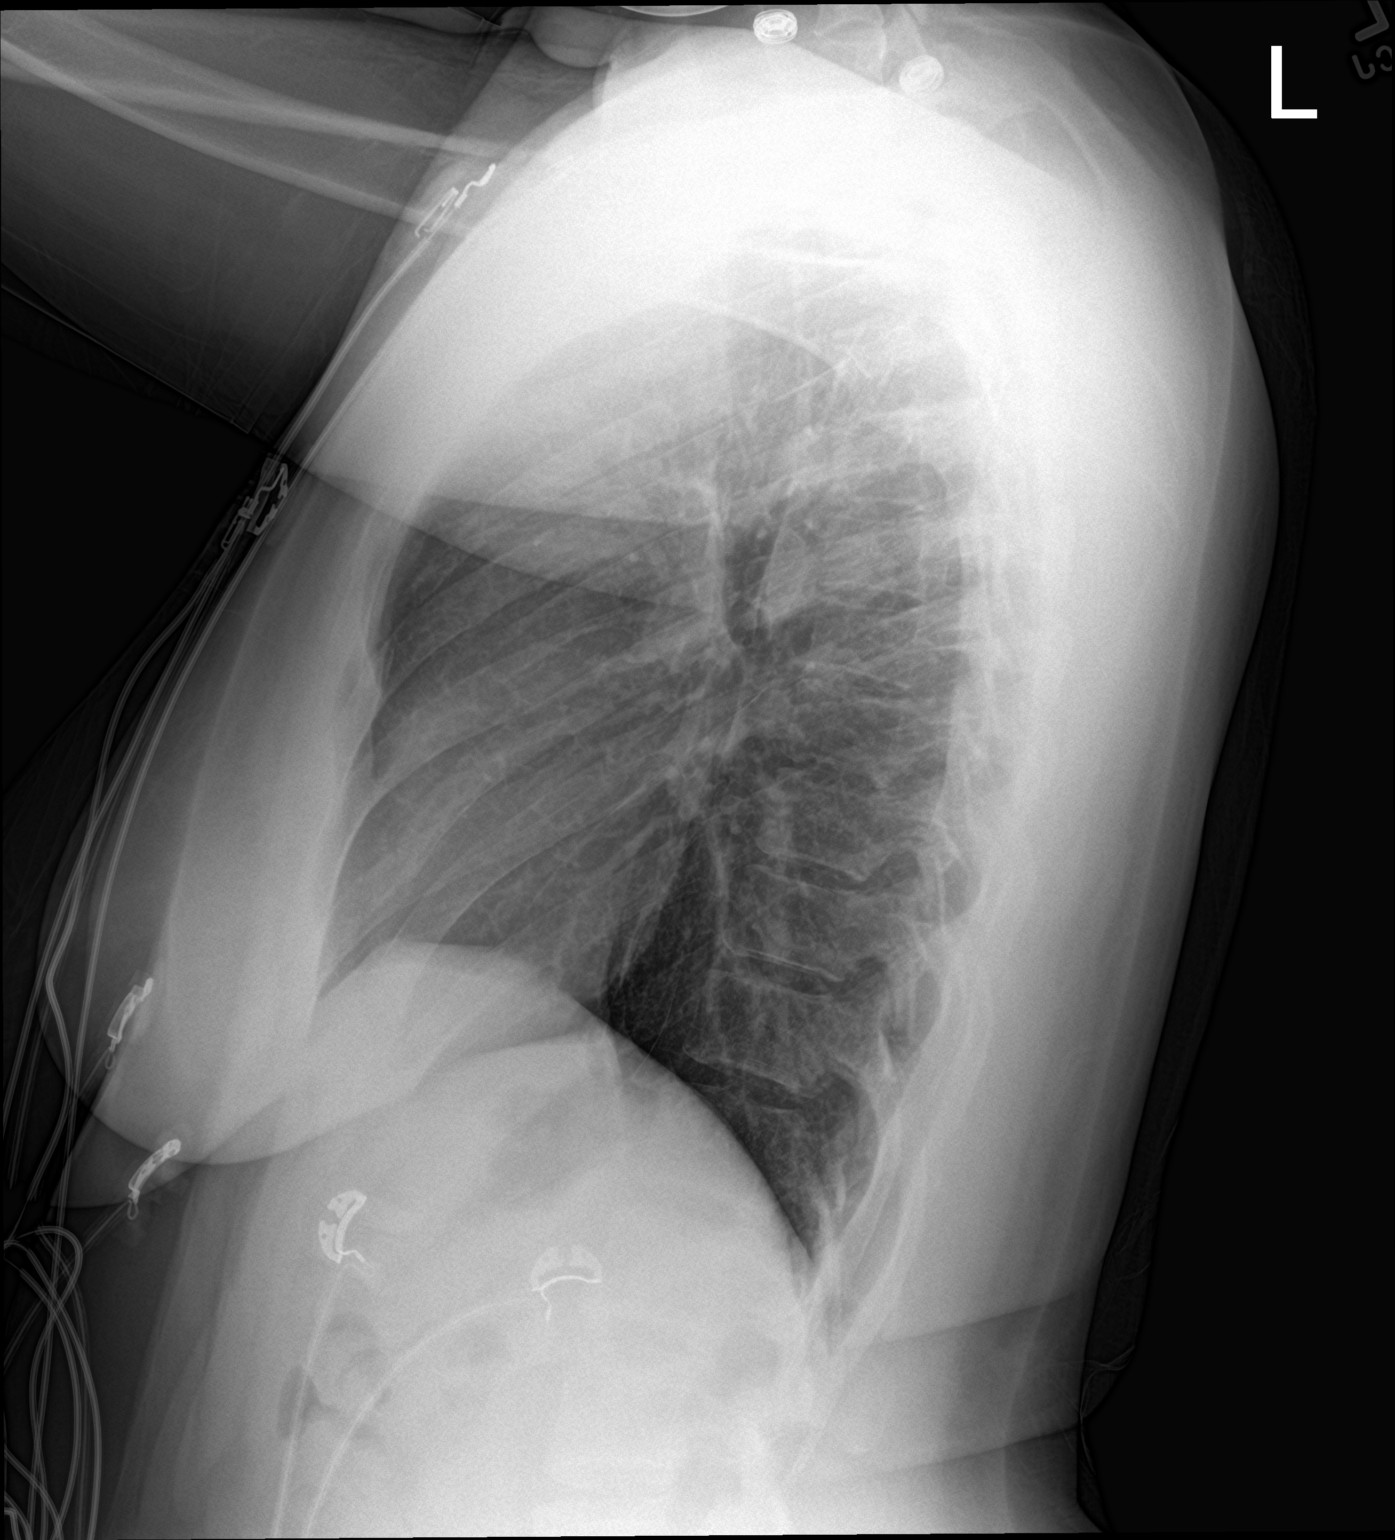

[2 of 2 positions shown; findings below may reference images not displayed]

FINDINGS: There is no pneumothorax or large pleural effusion. There is an
apparent cluster of nodules in the right mid lung zone with possible
adjacent mild architectural distortion/scarring. There is no pleural
effusion. No acute osseous abnormality. The heart size is
unremarkable. There may be an area of scarring or atelectasis in the
left upper lung zone.
IMPRESSION: 1. No acute cardiopulmonary process.
2. Apparent cluster of nodules in the right mid lung zone with
possible adjacent mild architectural distortion/scarring. This may
related to a prior infectious or inflammatory insult and can be
correlated with a nonemergent outpatient CT of the chest.

## 2021-11-02 ENCOUNTER — Ambulatory Visit (INDEPENDENT_AMBULATORY_CARE_PROVIDER_SITE_OTHER): Payer: Self-pay | Admitting: *Deleted

## 2021-11-02 ENCOUNTER — Other Ambulatory Visit (HOSPITAL_COMMUNITY)
Admission: RE | Admit: 2021-11-02 | Discharge: 2021-11-02 | Disposition: A | Discharge status: Home / Self Care | Payer: Federal, State, Local not specified - PPO | Source: Ambulatory Visit | Attending: Obstetrics & Gynecology | Admitting: Obstetrics & Gynecology

## 2021-11-02 VITALS — BP 123/67 | HR 97 | Wt 198.6 lb

## 2021-11-02 DIAGNOSIS — Z113 Encounter for screening for infections with a predominantly sexual mode of transmission: Secondary | ICD-10-CM | POA: Diagnosis not present

## 2021-11-02 DIAGNOSIS — N898 Other specified noninflammatory disorders of vagina: Secondary | ICD-10-CM | POA: Insufficient documentation

## 2021-11-02 DIAGNOSIS — Z3201 Encounter for pregnancy test, result positive: Secondary | ICD-10-CM

## 2021-11-02 DIAGNOSIS — N926 Irregular menstruation, unspecified: Secondary | ICD-10-CM

## 2021-11-02 LAB — POCT URINE PREGNANCY: Preg Test, Ur: POSITIVE — AB

## 2021-11-02 NOTE — Progress Notes (Signed)
   NURSE VISIT- PREGNANCY CONFIRMATION   SUBJECTIVE:  Yvonne Stone is a 31 y.o. [phone] female at [redacted]w[redacted]d by certain LMP of Patient's last menstrual period was 09/17/2021 (exact date). Here for pregnancy confirmation and vaginal swab.  Home pregnancy test: positive x 2   She reports the following symptoms: discharge described as clear, odor, and vulvar itching for 2 days. Denies abnormal vaginal bleeding, significant pelvic pain, fever, or UTI symptoms.She is taking prenatal vitamins.    OBJECTIVE:  BP 123/67 (BP Location: Right Arm, Patient Position: Sitting, Cuff Size: Normal)   Pulse 97   Wt 198 lb 9.6 oz (90.1 kg)   LMP 09/17/2021 (Exact Date)   BMI 33.05 kg/m   Appears well, in no apparent distress  Results for orders placed or performed in visit on 11/02/21 (from the past 24 hour(s))  POCT urine pregnancy   Collection Time: 11/02/21  4:06 PM  Result Value Ref Range   Preg Test, Ur Positive (A) Negative    ASSESSMENT: Positive pregnancy test, [redacted]w[redacted]d by LMP   Vaginal swab for vaginitis and std screening  PLAN: Schedule for dating ultrasound in 2 weeks Prenatal vitamins: continue   Nausea medicines: not currently needed   OB packet given: Yes Self-collected vaginal probe for Gonorrhea, Chlamydia, Trichomonas, Bacterial Vaginosis, Yeast sent to lab Treatment: to be determined once results are received  Alice Rieger  11/02/2021 4:10 PM

## 2021-11-04 LAB — CERVICOVAGINAL ANCILLARY ONLY
Bacterial Vaginitis (gardnerella): POSITIVE — AB
Candida Glabrata: NEGATIVE
Candida Vaginitis: POSITIVE — AB
Chlamydia: NEGATIVE
Comment: NEGATIVE
Comment: NEGATIVE
Comment: NEGATIVE
Comment: NEGATIVE
Comment: NEGATIVE
Comment: NORMAL
Neisseria Gonorrhea: NEGATIVE
Trichomonas: NEGATIVE

## 2021-11-05 ENCOUNTER — Other Ambulatory Visit: Payer: Self-pay | Admitting: Adult Health

## 2021-11-05 MED ORDER — METRONIDAZOLE 500 MG PO TABS: 500.0000 mg | ORAL_TABLET | Freq: Two times a day (BID) | ORAL | 0 refills | Status: DC

## 2021-11-05 NOTE — Progress Notes (Signed)
+  BV and yeast on vaginal swab will rx flagyl and monistat  

## 2021-11-17 ENCOUNTER — Other Ambulatory Visit: Payer: Self-pay | Admitting: Obstetrics & Gynecology

## 2021-11-17 DIAGNOSIS — O3680X0 Pregnancy with inconclusive fetal viability, not applicable or unspecified: Secondary | ICD-10-CM

## 2021-11-18 ENCOUNTER — Ambulatory Visit (INDEPENDENT_AMBULATORY_CARE_PROVIDER_SITE_OTHER): Payer: Federal, State, Local not specified - PPO

## 2021-11-18 DIAGNOSIS — Z3A08 8 weeks gestation of pregnancy: Secondary | ICD-10-CM

## 2021-11-18 DIAGNOSIS — O3680X0 Pregnancy with inconclusive fetal viability, not applicable or unspecified: Secondary | ICD-10-CM

## 2021-11-18 NOTE — Progress Notes (Signed)
[place] 7+3 wks,single IUP with yolk sac,CRL 12.19 mm,normal ovaries,FHR 147 bpm

## 2021-11-23 ENCOUNTER — Telehealth: Payer: Self-pay | Admitting: Adult Health

## 2021-11-23 NOTE — Telephone Encounter (Signed)
Patient states that the medicine isn't working that she is taking. Please advise.

## 2021-11-23 NOTE — Telephone Encounter (Signed)
Pt informed, will come tomorrow for reswab.

## 2021-11-23 NOTE — Telephone Encounter (Signed)
Spoke with patient she finished the monistat and metronidazole and still has the same symptoms. She has an odor, discharge and itching. Advised I would send message to Victorino Dike to see what she recommends.

## 2021-11-24 ENCOUNTER — Ambulatory Visit (INDEPENDENT_AMBULATORY_CARE_PROVIDER_SITE_OTHER): Payer: Federal, State, Local not specified - PPO | Admitting: Adult Health

## 2021-11-24 ENCOUNTER — Encounter: Payer: Self-pay | Admitting: Adult Health

## 2021-11-24 VITALS — BP 102/68 | HR 88 | Ht 64.0 in | Wt 204.0 lb

## 2021-11-24 DIAGNOSIS — N760 Acute vaginitis: Secondary | ICD-10-CM | POA: Insufficient documentation

## 2021-11-24 DIAGNOSIS — K5900 Constipation, unspecified: Secondary | ICD-10-CM

## 2021-11-24 DIAGNOSIS — B9689 Other specified bacterial agents as the cause of diseases classified elsewhere: Secondary | ICD-10-CM | POA: Diagnosis not present

## 2021-11-24 DIAGNOSIS — N898 Other specified noninflammatory disorders of vagina: Secondary | ICD-10-CM | POA: Insufficient documentation

## 2021-11-24 LAB — POCT WET PREP (WET MOUNT)
Clue Cells Wet Prep Whiff POC: POSITIVE
Trichomonas Wet Prep HPF POC: ABSENT

## 2021-11-24 MED ORDER — METRONIDAZOLE 500 MG PO TABS: 500.0000 mg | ORAL_TABLET | Freq: Two times a day (BID) | ORAL | 0 refills | Status: DC

## 2021-11-24 MED ORDER — SENNA 8.6 MG PO TABS: 1.0000 | ORAL_TABLET | Freq: Every day | ORAL | 0 refills | Status: DC

## 2021-11-24 NOTE — Progress Notes (Signed)
  Subjective:     Patient ID: Yvonne Stone, female   DOB: 23-Nov-1990, 31 y.o.   MRN: [id number]  HPI Yvonne Stone is a 31 year old black female, married, but getting divorced, [id number], about 8+[redacted] weeks pregnant in complaining of vaginal discharge and odor, had +BV and yeast on vaginal swab 11/02/21 and was treated with flagyl and monistat. Has not had sex since then. Swab was negative for GC/CHL and trich. She is constipated. PCP is Dr Neita Carp   Review of Systems Has vaginal discharge and odor No sex since taking flagyl and using monistat +constipation Reviewed past medical,surgical, social and family history. Reviewed medications and allergies.     Objective:   Physical Exam BP 102/68 (BP Location: Left Arm, Patient Position: Sitting, Cuff Size: Large)   Pulse 88   Ht 5\' 4"  (1.626 m)   Wt 204 lb (92.5 kg)   LMP 09/17/2021 (Exact Date)   BMI 35.02 kg/m     Skin warm and dry.Pelvic: external genitalia is normal in appearance no lesions, vagina: white discharge with odor,urethra has no lesions or masses noted, cervix:smooth and bulbous, uterus: about 9 week size, normal shape and contour, non tender, no masses felt, adnexa: no masses or tenderness noted. Bladder is non tender and no masses felt. Wet prep: + for clue cells and +WBCs.  Upstream - 11/24/21 1000       Pregnancy Intention Screening   Does the patient want to become pregnant in the next year? Yes    Does the patient's partner want to become pregnant in the next year? Yes    Would the patient like to discuss contraceptive options today? No      Contraception Wrap Up   Current Method Pregnant/Seeking Pregnancy    End Method Pregnant/Seeking Pregnancy    Contraception Counseling Provided No            Examination chaperoned by 11/26/21 LPN  Assessment:     1. Vaginal discharge Wet prep +Clue cells and WBC  2. Vaginal odor +whiff   3. BV (bacterial vaginosis) + clue cells on wet prep Will rx flagyl, no sex or  alcohol while taking meds Meds ordered this encounter  Medications   senna (SENOKOT) 8.6 MG TABS tablet    Sig: Take 1 tablet (8.6 mg total) by mouth at bedtime.    Dispense:  120 tablet    Refill:  0    Order Specific Question:   Supervising Provider    Answer:   Faith Rogue H [[id number]]   metroNIDAZOLE (FLAGYL) 500 MG tablet    Sig: Take 1 tablet (500 mg total) by mouth 2 (two) times daily.    Dispense:  14 tablet    Refill:  0    Order Specific Question:   Supervising Provider    Answer:   Duane Lope, LUTHER H [[id number]]    4. Constipation, unspecified constipation type Tried colace di not help Try senokot and prunes, more fruit     Plan:     Return as scheduled 12/27/21 for 12/29/21 and New OB visit or sooner if needed

## 2021-12-08 DIAGNOSIS — H6121 Impacted cerumen, right ear: Secondary | ICD-10-CM | POA: Diagnosis not present

## 2021-12-08 DIAGNOSIS — H9201 Otalgia, right ear: Secondary | ICD-10-CM | POA: Diagnosis not present

## 2021-12-21 ENCOUNTER — Inpatient Hospital Stay (HOSPITAL_COMMUNITY): Admit: 2021-12-21 | Payer: Medicaid Other | Admitting: Obstetrics & Gynecology

## 2021-12-23 ENCOUNTER — Ambulatory Visit: Payer: Medicaid Other | Admitting: *Deleted

## 2021-12-24 ENCOUNTER — Other Ambulatory Visit: Payer: Self-pay | Admitting: Obstetrics & Gynecology

## 2021-12-24 DIAGNOSIS — Z3682 Encounter for antenatal screening for nuchal translucency: Secondary | ICD-10-CM

## 2021-12-27 ENCOUNTER — Other Ambulatory Visit (HOSPITAL_COMMUNITY)
Admission: RE | Admit: 2021-12-27 | Discharge: 2021-12-27 | Disposition: A | Discharge status: Home / Self Care | Payer: Federal, State, Local not specified - PPO | Source: Ambulatory Visit | Attending: Women's Health | Admitting: Women's Health

## 2021-12-27 ENCOUNTER — Ambulatory Visit (INDEPENDENT_AMBULATORY_CARE_PROVIDER_SITE_OTHER): Payer: Federal, State, Local not specified - PPO | Admitting: Women's Health

## 2021-12-27 ENCOUNTER — Ambulatory Visit (INDEPENDENT_AMBULATORY_CARE_PROVIDER_SITE_OTHER): Payer: Federal, State, Local not specified - PPO

## 2021-12-27 ENCOUNTER — Ambulatory Visit: Payer: Medicaid Other | Admitting: *Deleted

## 2021-12-27 ENCOUNTER — Encounter: Payer: Self-pay | Admitting: Women's Health

## 2021-12-27 VITALS — BP 109/77 | HR 73 | Wt 211.6 lb

## 2021-12-27 DIAGNOSIS — Z3490 Encounter for supervision of normal pregnancy, unspecified, unspecified trimester: Secondary | ICD-10-CM | POA: Insufficient documentation

## 2021-12-27 DIAGNOSIS — Z3480 Encounter for supervision of other normal pregnancy, unspecified trimester: Secondary | ICD-10-CM | POA: Diagnosis not present

## 2021-12-27 DIAGNOSIS — Z3A13 13 weeks gestation of pregnancy: Secondary | ICD-10-CM | POA: Diagnosis not present

## 2021-12-27 DIAGNOSIS — N838 Other noninflammatory disorders of ovary, fallopian tube and broad ligament: Secondary | ICD-10-CM

## 2021-12-27 DIAGNOSIS — Z113 Encounter for screening for infections with a predominantly sexual mode of transmission: Secondary | ICD-10-CM

## 2021-12-27 DIAGNOSIS — Z124 Encounter for screening for malignant neoplasm of cervix: Secondary | ICD-10-CM | POA: Diagnosis not present

## 2021-12-27 DIAGNOSIS — Z3481 Encounter for supervision of other normal pregnancy, first trimester: Secondary | ICD-10-CM

## 2021-12-27 DIAGNOSIS — Z131 Encounter for screening for diabetes mellitus: Secondary | ICD-10-CM

## 2021-12-27 DIAGNOSIS — Z98891 History of uterine scar from previous surgery: Secondary | ICD-10-CM | POA: Diagnosis not present

## 2021-12-27 DIAGNOSIS — Z3682 Encounter for antenatal screening for nuchal translucency: Secondary | ICD-10-CM | POA: Diagnosis not present

## 2021-12-27 HISTORY — DX: History of uterine scar from previous surgery: Z98.891

## 2021-12-27 LAB — POCT URINALYSIS DIPSTICK OB
Blood, UA: NEGATIVE
Glucose, UA: NEGATIVE
Ketones, UA: NEGATIVE
Leukocytes, UA: NEGATIVE
Nitrite, UA: NEGATIVE
POC,PROTEIN,UA: NEGATIVE

## 2021-12-27 MED ORDER — BONJESTA 20-20 MG PO TBCR: 1.0000 | EXTENDED_RELEASE_TABLET | Freq: Every day | ORAL | 8 refills | Status: DC

## 2021-12-27 MED ORDER — ASPIRIN 81 MG PO TBEC: 81.0000 mg | DELAYED_RELEASE_TABLET | Freq: Every day | ORAL | 3 refills | Status: DC

## 2021-12-27 MED ORDER — BLOOD PRESSURE MONITOR MISC: 0 refills | Status: DC

## 2021-12-27 NOTE — Progress Notes (Signed)
INITIAL OBSTETRICAL VISIT Patient name: Yvonne Stone MRN [id number]  Date of birth: 07-May-1991 Chief Complaint:   Initial Prenatal Visit (constipation)  History of Present Illness:   Yvonne Stone is a 31 y.o. [phone] African-American female at [redacted]w[redacted]d by [place] at 7 weeks with an Estimated Date of Delivery: 07/04/22 being seen today for her initial obstetrical visit.   Patient's last menstrual period was 09/17/2021 (exact date). Her obstetrical history is significant for  SAB x 1, C/S for [facility] then elective RCS .   Today she reports  has had bv and yeast on and off . Some nausea, no vomiting, would like rx. Constipation.  Last pap 2019. Results were: NILM w/ HRHPV not done     12/27/2021    2:46 PM 12/16/2016   11:50 AM 12/16/2016   11:49 AM 04/11/2016    4:34 PM  Depression screen PHQ 2/9  Decreased Interest 2 1 1 3   Down, Depressed, Hopeless 2 1 1 3   PHQ - 2 Score 4 2 2 6   Altered sleeping 2 2  2   Tired, decreased energy 2 2  2   Change in appetite 2 2  3   Feeling bad or failure about yourself  2 1  3   Trouble concentrating 2 1  2   Moving slowly or fidgety/restless 0 1  2  Suicidal thoughts 0 0  2  PHQ-9 Score 14 11  22         12/27/2021    2:46 PM  GAD 7 : Generalized Anxiety Score  Nervous, Anxious, on Edge 2  Control/stop worrying 2  Worry too much - different things 2  Trouble relaxing 2  Restless 0  Easily annoyed or irritable 2  Afraid - awful might happen 2  Total GAD 7 Score 12     Review of Systems:   Pertinent items are noted in HPI Denies cramping/contractions, leakage of fluid, vaginal bleeding, abnormal vaginal discharge w/ itching/odor/irritation, headaches, visual changes, shortness of breath, chest pain, abdominal pain, severe nausea/vomiting, or problems with urination or bowel movements unless otherwise stated above.  Pertinent History Reviewed:  Reviewed past medical,surgical, social, obstetrical and family history.  Reviewed problem list,  medications and allergies. OB History  Gravida Para Term Preterm AB Living  4 2 2   1 2   SAB IAB Ectopic Multiple Live Births  1     0 2    # Outcome Date GA Lbr Len/2nd Weight Sex Delivery Anes PTL Lv  4 Current           3 Term 07/15/16 [redacted]w[redacted]d  7 lb 2.8 oz (3.255 kg) M CS-LTranv Spinal  LIV  2 Term 02/22/14 [redacted]w[redacted]d  6 lb 10 oz (3.005 kg) M CS-LTranv  N LIV     Complications: Fetal Intolerance  1 SAB            Physical Assessment:   Vitals:   12/27/21 1524  BP: 109/77  Pulse: 73  Weight: 211 lb 9.6 oz (96 kg)  Body mass index is 36.32 kg/m.       Physical Examination:  General appearance - well appearing, and in no distress  Mental status - alert, oriented to person, place, and time  Psych:  She has a normal mood and affect  Skin - warm and dry, normal color, no suspicious lesions noted  Chest - effort normal, all lung fields clear to auscultation bilaterally  Heart - normal rate and regular rhythm  Abdomen - soft, nontender  Extremities:  No swelling or varicosities noted  Pelvic - VULVA: normal appearing vulva with no masses, tenderness or lesions  VAGINA: normal appearing vagina with normal color and discharge, no lesions  CERVIX: normal appearing cervix without discharge or lesions, no CMT  Thin prep pap is done w/ reflex HR HPV cotesting  Chaperone: Latisha Cresenzo    TODAY'S NT [place] 13 wks,measurements c/w dates,CRL 73.80 mm,FHR 162 bpm,posterior placenta gr 0,NT 1.7 mm,NB present,normal left ovary,5.2 x 2.5 x 2.9 cm complex right ovarian unilocular cyst,nonvascular with cystic and sold components(? Dermoid) four echogenic nodules within the cyst with posterior acoustic shadowing (1) 2.5 x 2.5  X 2.8 cm,(2) 2.1 x 1.9 cm.,(#3) 1 x 1.1 cm,(#4) 1 x 1.1 cm  Results for orders placed or performed in visit on 12/27/21 (from the past 24 hour(s))  POC Urinalysis Dipstick OB   Collection Time: 12/27/21  3:32 PM  Result Value Ref Range   Color, UA     Clarity, UA     Glucose,  UA Negative Negative   Bilirubin, UA     Ketones, UA neg    Spec Grav, UA     Blood, UA neg    pH, UA     POC,PROTEIN,UA Negative Negative, Trace, Small (1+), Moderate (2+), Large (3+), 4+   Urobilinogen, UA     Nitrite, UA neg    Leukocytes, UA Negative Negative   Appearance     Odor      Assessment & Plan:  1) Low-Risk Pregnancy [id number] at [redacted]w[redacted]d with an Estimated Date of Delivery: 07/04/22   2) Initial OB visit  3) Rt complex ovarian cyst ? dermoid> discussed, will check again at anatomy u/s and f/u pp  4) Nausea> rx bonjesta  5) Constipation> gave printed prevention/relief measures   Meds:  Meds ordered this encounter  Medications   Blood Pressure Monitor MISC    Sig: For regular home bp monitoring during pregnancy    Dispense:  1 each    Refill:  0    Z34.81 Please mail to patient    Initial labs obtained Continue prenatal vitamins Reviewed n/v relief measures and warning s/s to report Reviewed recommended weight gain based on pre-gravid BMI Encouraged well-balanced diet Genetic & carrier screening discussed: requests NT/IT, declines Panorama, AFP, and Horizon  Ultrasound discussed; fetal survey: requested CCNC completed> form faxed if has or is planning to apply for medicaid The nature of [employer] for [employer] with multiple MDs and other Advanced Practice Providers was explained to patient; also emphasized that fellows, residents, and students are part of our team. Does not have home bp cuff. Office bp cuff given: no. Rx sent: yes. Check bp weekly, let us know if consistently >140/90.   Indications for ASA therapy (per uptodate) OR Two or more of the following: Obesity (BMI>30 kg/m2) Yes Sociodemographic characteristics (African American race, low socioeconomic level) Yes  Indications for early A1C (per uptodate) BMI >=25 (>=23 in Asian women) AND one of the following High-risk race/ethnicity (eg, African American, Latino, Native American,  [place] American, Pacific Islander) Yes  Follow-up: No follow-ups on file.   Orders Placed This Encounter  Procedures   Urine Culture   Integrated 1   CBC/D/Plt+RPR+Rh+ABO+RubIgG...   POC Urinalysis Dipstick OB    Cheral Marker CNM, [facility] 12/27/2021 3:47 PM

## 2021-12-27 NOTE — Patient Instructions (Signed)
Yvonne Stone, thank you for choosing our office today! We appreciate the opportunity to meet your healthcare needs. You may receive a short survey by mail, e-mail, or through [employer]. If you are happy with your care we would appreciate if you could take just a few minutes to complete the survey questions. We read all of your comments and take your feedback very seriously. Thank you again for choosing our office.  Center for [employer] Healthcare Team at [facility]  [facility] at [facility] ([address] [place], [place] [address]) Entrance C, located off of [address] Free 24/7 valet parking   Nausea & Vomiting Have saltine crackers or pretzels by your bed and eat a few bites before you raise your head out of bed in the morning Eat small frequent meals throughout the day instead of large meals Drink plenty of fluids throughout the day to stay hydrated, just don't drink a lot of fluids with your meals.  This can make your stomach fill up faster making you feel sick Do not brush your teeth right after you eat Products with real ginger are good for nausea, like ginger ale and ginger hard candy Make sure it says made with real ginger! Sucking on sour candy like lemon heads is also good for nausea If your prenatal vitamins make you nauseated, take them at night so you will sleep through the nausea Sea Bands If you feel like you need medicine for the nausea & vomiting please let us know If you are unable to keep any fluids or food down please let us know   Constipation Drink plenty of fluid, preferably water, throughout the day Eat foods high in fiber such as fruits, vegetables, and grains Exercise, such as walking, is a good way to keep your bowels regular Drink warm fluids, especially warm prune juice, or decaf coffee Eat a 1/2 cup of real oatmeal (not instant), 1/2 cup applesauce, and 1/2-1 cup warm prune juice every day If needed, you may take Colace (docusate sodium) stool softener  once or twice a day to help keep the stool soft.  If you still are having problems with constipation, you may take Miralax once daily as needed to help keep your bowels regular.   Home Blood Pressure Monitoring for Patients   Your provider has recommended that you check your blood pressure (BP) at least once a week at home. If you do not have a blood pressure cuff at home, one will be provided for you. Contact your provider if you have not received your monitor within 1 week.   Helpful Tips for Accurate Home Blood Pressure Checks  Don't smoke, exercise, or drink caffeine 30 minutes before checking your BP Use the restroom before checking your BP (a full bladder can raise your pressure) Relax in a comfortable upright chair Feet on the ground Left arm resting comfortably on a flat surface at the level of your heart Legs uncrossed Back supported Sit quietly and don't talk Place the cuff on your bare arm Adjust snuggly, so that only two fingertips can fit between your skin and the top of the cuff Check 2 readings separated by at least one minute Keep a log of your BP readings For a visual, please reference this diagram: http://ccnc.care/bpdiagram  Provider Name: [facility]     Phone: [phone]  Zone 1: ALL CLEAR  Continue to monitor your symptoms:  BP reading is less than 140 (top number) or less than 90 (bottom  number)  No right upper stomach pain No headaches or seeing spots No feeling nauseated or throwing up No swelling in face and hands  Zone 2: CAUTION Call your doctor's office for any of the following:  BP reading is greater than 140 (top number) or greater than 90 (bottom number)  Stomach pain under your ribs in the middle or right side Headaches or seeing spots Feeling nauseated or throwing up Swelling in face and hands  Zone 3: EMERGENCY  Seek immediate medical care if you have any of the following:  BP reading is greater than160 (top number) or greater than  110 (bottom number) Severe headaches not improving with Tylenol Serious difficulty catching your breath Any worsening symptoms from Zone 2    First Trimester of Pregnancy The first trimester of pregnancy is from week 1 until the end of week 12 (months 1 through 3). A week after a sperm fertilizes an egg, the egg will implant on the wall of the uterus. This embryo will begin to develop into a baby. Genes from you and your partner are forming the baby. The female genes determine whether the baby is a boy or a girl. At 6-8 weeks, the eyes and face are formed, and the heartbeat can be seen on ultrasound. At the end of 12 weeks, all the baby's organs are formed.  Now that you are pregnant, you will want to do everything you can to have a healthy baby. Two of the most important things are to get good prenatal care and to follow your health care provider's instructions. Prenatal care is all the medical care you receive before the baby's birth. This care will help prevent, find, and treat any problems during the pregnancy and childbirth. BODY CHANGES Your body goes through many changes during pregnancy. The changes vary from woman to woman.  You may gain or lose a couple of pounds at first. You may feel sick to your stomach (nauseous) and throw up (vomit). If the vomiting is uncontrollable, call your health care provider. You may tire easily. You may develop headaches that can be relieved by medicines approved by your health care provider. You may urinate more often. Painful urination may mean you have a bladder infection. You may develop heartburn as a result of your pregnancy. You may develop constipation because certain hormones are causing the muscles that push waste through your intestines to slow down. You may develop hemorrhoids or swollen, bulging veins (varicose veins). Your breasts may begin to grow larger and become tender. Your nipples may stick out more, and the tissue that surrounds them  (areola) may become darker. Your gums may bleed and may be sensitive to brushing and flossing. Dark spots or blotches (chloasma, mask of pregnancy) may develop on your face. This will likely fade after the baby is born. Your menstrual periods will stop. You may have a loss of appetite. You may develop cravings for certain kinds of food. You may have changes in your emotions from day to day, such as being excited to be pregnant or being concerned that something may go wrong with the pregnancy and baby. You may have more vivid and strange dreams. You may have changes in your hair. These can include thickening of your hair, rapid growth, and changes in texture. Some women also have hair loss during or after pregnancy, or hair that feels dry or thin. Your hair will most likely return to normal after your baby is born. WHAT TO EXPECT AT YOUR PRENATAL  VISITS During a routine prenatal visit: You will be weighed to make sure you and the baby are growing normally. Your blood pressure will be taken. Your abdomen will be measured to track your baby's growth. The fetal heartbeat will be listened to starting around week 10 or 12 of your pregnancy. Test results from any previous visits will be discussed. Your health care provider may ask you: How you are feeling. If you are feeling the baby move. If you have had any abnormal symptoms, such as leaking fluid, bleeding, severe headaches, or abdominal cramping. If you have any questions. Other tests that may be performed during your first trimester include: Blood tests to find your blood type and to check for the presence of any previous infections. They will also be used to check for low iron levels (anemia) and Rh antibodies. Later in the pregnancy, blood tests for diabetes will be done along with other tests if problems develop. Urine tests to check for infections, diabetes, or protein in the urine. An ultrasound to confirm the proper growth and development  of the baby. An amniocentesis to check for possible genetic problems. Fetal screens for spina bifida and Down syndrome. You may need other tests to make sure you and the baby are doing well. HOME CARE INSTRUCTIONS  Medicines Follow your health care provider's instructions regarding medicine use. Specific medicines may be either safe or unsafe to take during pregnancy. Take your prenatal vitamins as directed. If you develop constipation, try taking a stool softener if your health care provider approves. Diet Eat regular, well-balanced meals. Choose a variety of foods, such as meat or vegetable-based protein, fish, milk and low-fat dairy products, vegetables, fruits, and whole grain breads and cereals. Your health care provider will help you determine the amount of weight gain that is right for you. Avoid raw meat and uncooked cheese. These carry germs that can cause birth defects in the baby. Eating four or five small meals rather than three large meals a day may help relieve nausea and vomiting. If you start to feel nauseous, eating a few soda crackers can be helpful. Drinking liquids between meals instead of during meals also seems to help nausea and vomiting. If you develop constipation, eat more high-fiber foods, such as fresh vegetables or fruit and whole grains. Drink enough fluids to keep your urine clear or pale yellow. Activity and Exercise Exercise only as directed by your health care provider. Exercising will help you: Control your weight. Stay in shape. Be prepared for labor and delivery. Experiencing pain or cramping in the lower abdomen or low back is a good sign that you should stop exercising. Check with your health care provider before continuing normal exercises. Try to avoid standing for long periods of time. Move your legs often if you must stand in one place for a long time. Avoid heavy lifting. Wear low-heeled shoes, and practice good posture. You may continue to have sex  unless your health care provider directs you otherwise. Relief of Pain or Discomfort Wear a good support bra for breast tenderness.   Take warm sitz baths to soothe any pain or discomfort caused by hemorrhoids. Use hemorrhoid cream if your health care provider approves.   Rest with your legs elevated if you have leg cramps or low back pain. If you develop varicose veins in your legs, wear support hose. Elevate your feet for 15 minutes, 3-4 times a day. Limit salt in your diet. Prenatal Care Schedule your prenatal visits by the  twelfth week of pregnancy. They are usually scheduled monthly at first, then more often in the last 2 months before delivery. Write down your questions. Take them to your prenatal visits. Keep all your prenatal visits as directed by your health care provider. Safety Wear your seat belt at all times when driving. Make a list of emergency phone numbers, including numbers for family, friends, the hospital, and police and fire departments. General Tips Ask your health care provider for a referral to a local prenatal education class. Begin classes no later than at the beginning of month 6 of your pregnancy. Ask for help if you have counseling or nutritional needs during pregnancy. Your health care provider can offer advice or refer you to specialists for help with various needs. Do not use hot tubs, steam rooms, or saunas. Do not douche or use tampons or scented sanitary pads. Do not cross your legs for long periods of time. Avoid cat litter boxes and soil used by cats. These carry germs that can cause birth defects in the baby and possibly loss of the fetus by miscarriage or stillbirth. Avoid all smoking, herbs, alcohol, and medicines not prescribed by your health care provider. Chemicals in these affect the formation and growth of the baby. Schedule a dentist appointment. At home, brush your teeth with a soft toothbrush and be gentle when you floss. SEEK MEDICAL CARE IF:   You have dizziness. You have mild pelvic cramps, pelvic pressure, or nagging pain in the abdominal area. You have persistent nausea, vomiting, or diarrhea. You have a bad smelling vaginal discharge. You have pain with urination. You notice increased swelling in your face, hands, legs, or ankles. SEEK IMMEDIATE MEDICAL CARE IF:  You have a fever. You are leaking fluid from your vagina. You have spotting or bleeding from your vagina. You have severe abdominal cramping or pain. You have rapid weight gain or loss. You vomit blood or material that looks like coffee grounds. You are exposed to [place] measles and have never had them. You are exposed to fifth disease or chickenpox. You develop a severe headache. You have shortness of breath. You have any kind of trauma, such as from a fall or a car accident. Document Released: 05/17/2001 Document Revised: 10/07/2013 Document Reviewed: 04/02/2013 [facility] Patient Information 2015 [place], [place]. This information is not intended to replace advice given to you by your health care provider. Make sure you discuss any questions you have with your health care provider.

## 2021-12-27 NOTE — Progress Notes (Addendum)
[place] 13 wks,measurements c/w dates,CRL 73.80 mm,FHR 162 bpm,posterior placenta gr 0,NT 1.7 mm,NB present,normal left ovary,5.2 x 2.5 x 2.9 cm complex right ovarian unilocular cyst,nonvascular with cystic and sold components(? Dermoid) four echogenic nodules within the cyst with posterior acoustic shadowing (1) 2.5 x 2.5  X 2.8 cm,(2) 2.1 x 1.9 cm.,(#3) 1 x 1.1 cm,(#4) 1 x 1.1 cm

## 2021-12-28 LAB — HEMOGLOBIN A1C
Est. average glucose Bld gHb Est-mCnc: 108 mg/dL
Hgb A1c MFr Bld: 5.4 % (ref 4.8–5.6)

## 2021-12-29 LAB — CBC/D/PLT+RPR+RH+ABO+RUBIGG...
Antibody Screen: NEGATIVE
Basophils Absolute: 0 10*3/uL (ref 0.0–0.2)
Basos: 0 %
EOS (ABSOLUTE): 0.1 10*3/uL (ref 0.0–0.4)
Eos: 1 %
HCV Ab: NONREACTIVE
HIV Screen 4th Generation wRfx: NONREACTIVE
Hematocrit: 37.1 % (ref 34.0–46.6)
Hemoglobin: 12.3 g/dL (ref 11.1–15.9)
Hepatitis B Surface Ag: NEGATIVE
Immature Grans (Abs): 0 10*3/uL (ref 0.0–0.1)
Immature Granulocytes: 0 %
Lymphocytes Absolute: 2.3 10*3/uL (ref 0.7–3.1)
Lymphs: 30 %
MCH: 27.7 pg (ref 26.6–33.0)
MCHC: 33.2 g/dL (ref 31.5–35.7)
MCV: 84 fL (ref 79–97)
Monocytes Absolute: 0.6 10*3/uL (ref 0.1–0.9)
Monocytes: 8 %
Neutrophils Absolute: 4.6 10*3/uL (ref 1.4–7.0)
Neutrophils: 61 %
Platelets: 228 10*3/uL (ref 150–450)
RBC: 4.44 x10E6/uL (ref 3.77–5.28)
RDW: 12.2 % (ref 11.7–15.4)
RPR Ser Ql: NONREACTIVE
Rh Factor: POSITIVE
Rubella Antibodies, IGG: 1.57 index (ref >0.99)
WBC: 7.6 10*3/uL (ref 3.4–10.8)

## 2021-12-29 LAB — INTEGRATED 1
Crown Rump Length: 73.8 mm
Gest. Age on Collection Date: 13.3 weeks
Maternal Age at EDD: 30.2 yr
Nuchal Translucency (NT): 1.7 mm
Number of Fetuses: 1
PAPP-A Value: 1783.5 ng/mL
Weight: 212 [lb_av]

## 2021-12-29 LAB — HCV INTERPRETATION

## 2021-12-29 LAB — URINE CULTURE

## 2022-01-03 ENCOUNTER — Telehealth: Payer: Self-pay | Admitting: *Deleted

## 2022-01-03 ENCOUNTER — Telehealth: Payer: Self-pay | Admitting: Women's Health

## 2022-01-03 LAB — CYTOLOGY - PAP
Chlamydia: NEGATIVE
Comment: NEGATIVE
Comment: NEGATIVE
Comment: NORMAL
Diagnosis: UNDETERMINED — AB
High risk HPV: NEGATIVE
Neisseria Gonorrhea: NEGATIVE

## 2022-01-03 NOTE — Telephone Encounter (Signed)
Pt is requesting a call back about her results 

## 2022-01-03 NOTE — Telephone Encounter (Signed)
Attempted to call patient back.  No answer.

## 2022-01-04 ENCOUNTER — Other Ambulatory Visit: Payer: Self-pay | Admitting: Women's Health

## 2022-01-04 MED ORDER — METRONIDAZOLE 500 MG PO TABS: 500.0000 mg | ORAL_TABLET | Freq: Two times a day (BID) | ORAL | 0 refills | Status: DC

## 2022-01-06 ENCOUNTER — Telehealth: Payer: Self-pay | Admitting: *Deleted

## 2022-01-06 MED ORDER — DOXYLAMINE-PYRIDOXINE 10-10 MG PO TBEC: DELAYED_RELEASE_TABLET | ORAL | 1 refills | Status: DC

## 2022-01-06 NOTE — Telephone Encounter (Signed)
Pts insurance will not cover Bonjesta but will cover Diclegis. [facility] is requesting new rx for Diclegis.

## 2022-01-06 NOTE — Telephone Encounter (Signed)
Will  diclegis

## 2022-01-06 NOTE — Addendum Note (Signed)
Addended by: Cyril Mourning A on: 01/06/2022 01:14 PM   Modules accepted: Orders

## 2022-01-08 ENCOUNTER — Emergency Department (HOSPITAL_COMMUNITY): Payer: Federal, State, Local not specified - PPO

## 2022-01-08 ENCOUNTER — Emergency Department (HOSPITAL_COMMUNITY)
Admission: EM | Admit: 2022-01-08 | Discharge: 2022-01-08 | Disposition: A | Discharge status: Home / Self Care | Payer: Federal, State, Local not specified - PPO | Attending: Student | Admitting: Student

## 2022-01-08 ENCOUNTER — Encounter (HOSPITAL_COMMUNITY): Payer: Self-pay

## 2022-01-08 DIAGNOSIS — O2311 Infections of bladder in pregnancy, first trimester: Secondary | ICD-10-CM | POA: Diagnosis not present

## 2022-01-08 DIAGNOSIS — O26892 Other specified pregnancy related conditions, second trimester: Secondary | ICD-10-CM | POA: Diagnosis not present

## 2022-01-08 DIAGNOSIS — R102 Pelvic and perineal pain: Secondary | ICD-10-CM | POA: Diagnosis not present

## 2022-01-08 DIAGNOSIS — O99892 Other specified diseases and conditions complicating childbirth: Secondary | ICD-10-CM | POA: Diagnosis not present

## 2022-01-08 DIAGNOSIS — R9431 Abnormal electrocardiogram [ECG] [EKG]: Secondary | ICD-10-CM | POA: Diagnosis not present

## 2022-01-08 DIAGNOSIS — O26891 Other specified pregnancy related conditions, first trimester: Secondary | ICD-10-CM | POA: Diagnosis not present

## 2022-01-08 DIAGNOSIS — Z7982 Long term (current) use of aspirin: Secondary | ICD-10-CM | POA: Insufficient documentation

## 2022-01-08 DIAGNOSIS — N9489 Other specified conditions associated with female genital organs and menstrual cycle: Secondary | ICD-10-CM | POA: Diagnosis not present

## 2022-01-08 DIAGNOSIS — M2420 Disorder of ligament, unspecified site: Secondary | ICD-10-CM | POA: Diagnosis not present

## 2022-01-08 DIAGNOSIS — N3000 Acute cystitis without hematuria: Secondary | ICD-10-CM | POA: Diagnosis not present

## 2022-01-08 DIAGNOSIS — Z3A15 15 weeks gestation of pregnancy: Secondary | ICD-10-CM | POA: Diagnosis not present

## 2022-01-08 DIAGNOSIS — N949 Unspecified condition associated with female genital organs and menstrual cycle: Secondary | ICD-10-CM

## 2022-01-08 DIAGNOSIS — O2312 Infections of bladder in pregnancy, second trimester: Secondary | ICD-10-CM | POA: Diagnosis not present

## 2022-01-08 LAB — CBC
HCT: 38 % (ref 36.0–46.0)
Hemoglobin: 12.5 g/dL (ref 12.0–15.0)
MCH: 27.8 pg (ref 26.0–34.0)
MCHC: 32.9 g/dL (ref 30.0–36.0)
MCV: 84.6 fL (ref 80.0–100.0)
Platelets: 220 10*3/uL (ref 150–400)
RBC: 4.49 MIL/uL (ref 3.87–5.11)
RDW: 12.9 % (ref 11.5–15.5)
WBC: 5.9 10*3/uL (ref 4.0–10.5)
nRBC: 0 % (ref 0.0–0.2)

## 2022-01-08 LAB — URINALYSIS, ROUTINE W REFLEX MICROSCOPIC
Bilirubin Urine: NEGATIVE
Glucose, UA: NEGATIVE mg/dL
Hgb urine dipstick: NEGATIVE
Ketones, ur: 80 mg/dL — AB
Nitrite: NEGATIVE
Protein, ur: 30 mg/dL — AB
Specific Gravity, Urine: 1.027 (ref 1.005–1.030)
pH: 5 (ref 5.0–8.0)

## 2022-01-08 LAB — COMPREHENSIVE METABOLIC PANEL
ALT: 20 U/L (ref 0–44)
AST: 21 U/L (ref 15–41)
Albumin: 3.7 g/dL (ref 3.5–5.0)
Alkaline Phosphatase: 60 U/L (ref 38–126)
Anion gap: 7 (ref 5–15)
BUN: 11 mg/dL (ref 6–20)
CO2: 22 mmol/L (ref 22–32)
Calcium: 9.3 mg/dL (ref 8.9–10.3)
Chloride: 106 mmol/L (ref 98–111)
Creatinine, Ser: 0.52 mg/dL (ref 0.44–1.00)
GFR, Estimated: >60 mL/min (ref >60)
Glucose, Bld: 79 mg/dL (ref 70–99)
Potassium: 4.2 mmol/L (ref 3.5–5.1)
Sodium: 135 mmol/L (ref 135–145)
Total Bilirubin: 0.3 mg/dL (ref 0.3–1.2)
Total Protein: 7.8 g/dL (ref 6.5–8.1)

## 2022-01-08 LAB — LIPASE, BLOOD: Lipase: 26 U/L (ref 11–51)

## 2022-01-08 LAB — HCG, QUANTITATIVE, PREGNANCY: hCG, Beta Chain, Quant, S: 33207 m[IU]/mL — ABNORMAL HIGH (ref <5)

## 2022-01-08 MED ORDER — SODIUM CHLORIDE 0.9 % IV BOLUS
1000.0000 mL | Freq: Once | INTRAVENOUS | Status: AC
  Administered 2022-01-08: 1000 mL via INTRAVENOUS

## 2022-01-08 MED ORDER — CEPHALEXIN 500 MG PO CAPS: 500.0000 mg | ORAL_CAPSULE | Freq: Three times a day (TID) | ORAL | 0 refills | Status: AC

## 2022-01-08 NOTE — ED Notes (Signed)
Fourth pregnancy. 2 children.

## 2022-01-08 NOTE — ED Provider Notes (Addendum)
[facility] EMERGENCY DEPARTMENT Provider Note   CSN: [id number] Arrival date & time: 01/08/22  1623     History  Chief Complaint  Patient presents with   Dizziness   Abdominal Cramping    [redacted] Weeks Pregnant    Yvonne Stone is a 31 y.o. female.  Patient with history of c section presents today with complaints of right sided abdominal pain. She states that same began this morning and has been persistent since. She states that she is approximately [redacted] weeks pregnant and has had prenatal care and has seen an OB/GYN who confirmed IUP with [place]. Denies any associated fevers, chills, nausea, vomiting, or diarrhea.  Also denies hematuria or dysuria. She is G4P2. Denies any vaginal bleeding or discharge. Has not felt fetal movements yet this pregnancy.  The history is provided by the patient. No language interpreter was used.  Dizziness Abdominal Cramping Associated symptoms include abdominal pain.       Home Medications Prior to Admission medications   Medication Sig Start Date End Date Taking? Authorizing Provider  aspirin EC 81 MG tablet Take 1 tablet (81 mg total) by mouth daily. Swallow whole. 12/27/21   Cheral Marker, CNM  Blood Pressure Monitor MISC For regular home bp monitoring during pregnancy 12/27/21   Cheral Marker, CNM  Doxylamine-Pyridoxine (DICLEGIS) 10-10 MG TBEC Take 2 at bedtime and 1 in am 01/06/22   Adline Potter, NP  metroNIDAZOLE (FLAGYL) 500 MG tablet Take 1 tablet (500 mg total) by mouth 2 (two) times daily. 01/04/22   Cheral Marker, CNM  Prenatal Vit-Fe Fumarate-FA (PRENATAL VITAMIN PO) Take by mouth.    [provider]      Allergies    Patient has no known allergies.    Review of Systems   Review of Systems  Gastrointestinal:  Positive for abdominal pain.  All other systems reviewed and are negative.   Physical Exam Updated Vital Signs BP 114/79   Pulse 70   Temp 98 F (36.7 C) (Oral)   Resp 19   Ht 5\' 5"  (1.651 m)    Wt 95.1 kg   LMP 09/17/2021 (Exact Date)   SpO2 100%   BMI 34.88 kg/m  Physical Exam Vitals and nursing note reviewed.  Constitutional:      General: She is not in acute distress.    Appearance: Normal appearance. She is normal weight. She is not ill-appearing, toxic-appearing or diaphoretic.  HENT:     Head: Normocephalic and atraumatic.  Cardiovascular:     Rate and Rhythm: Normal rate.  Pulmonary:     Effort: Pulmonary effort is normal. No respiratory distress.  Abdominal:     General: Abdomen is flat.     Palpations: Abdomen is soft.     Comments: Gravid abdomen with mild right sided suprapubic tenderness  Musculoskeletal:        General: Normal range of motion.     Cervical back: Normal range of motion.  Skin:    General: Skin is warm and dry.  Neurological:     General: No focal deficit present.     Mental Status: She is alert.  Psychiatric:        Mood and Affect: Mood normal.        Behavior: Behavior normal.     ED Results / Procedures / Treatments   Labs (all labs ordered are listed, but only abnormal results are displayed) Labs Reviewed  URINALYSIS, ROUTINE W REFLEX MICROSCOPIC - Abnormal; Notable for  the following components:      Result Value   APPearance CLOUDY (*)    Ketones, ur 80 (*)    Protein, ur 30 (*)    Leukocytes,Ua LARGE (*)    Bacteria, UA RARE (*)    All other components within normal limits  HCG, QUANTITATIVE, PREGNANCY - Abnormal; Notable for the following components:   hCG, Beta Chain, Quant, S 33,207 (*)    All other components within normal limits  URINE CULTURE  LIPASE, BLOOD  COMPREHENSIVE METABOLIC PANEL  CBC    EKG None  Radiology US OB Limited  Result Date: 01/08/2022 CLINICAL DATA:  Pelvic pain EXAM: LIMITED OBSTETRIC ULTRASOUND AND DOPPLER US OF OVARIES FINDINGS: Number of Fetuses: 1 Heart Rate:  158 bpm Movement: Yes Presentation: Cephalic Placental Location: Posterior Previa: No Amniotic Fluid (Subjective):  Normal  BPD:  3.0cm 15w 2d MATERNAL FINDINGS: Cervix:  Appears closed. Uterus/Adnexae: Right ovary measures 3.8 x 2.5 x 2.8 cm. There is a minimally complex 1.9 cm right ovarian cyst with a thin internal septation. The left ovary measures 3.3 x 2.1 by 2.4 cm. No free fluid or pelvic mass. Pulsed Doppler evaluation of both ovaries was performed. On the left, normal low resistance arterial and venous waveforms are documented. On the right, normal low resistance venous waveforms documented. Arterial waveform is difficult to demonstrate, felt to be technical as color flow is visualized within the right ovary on cine imaging. Other findings No free fluid. IMPRESSION: 1. Single live intrauterine pregnancy as above estimated age 59 weeks and 2 days. 2. Minimally complex right ovarian cyst or follicle measuring 1.9 cm. 3. Difficulty in documenting arterial waveform within the right ovary, felt to be technical. Normal color Doppler and venous waveform within the right ovary. Normal Doppler interrogation of the left ovary. This exam is performed on an emergent basis and does not comprehensively evaluate fetal size, dating, or anatomy; follow-up complete OB US should be considered if further fetal assessment is warranted. Electronically Signed   By: Sharlet Salina M.D.   On: 01/08/2022 23:10   US PELVIC DOPPLER (TORSION R/O OR MASS ARTERIAL FLOW)  Result Date: 01/08/2022 CLINICAL DATA:  Pelvic pain EXAM: LIMITED OBSTETRIC ULTRASOUND AND DOPPLER US OF OVARIES FINDINGS: Number of Fetuses: 1 Heart Rate:  158 bpm Movement: Yes Presentation: Cephalic Placental Location: Posterior Previa: No Amniotic Fluid (Subjective):  Normal BPD:  3.0cm 15w 2d MATERNAL FINDINGS: Cervix:  Appears closed. Uterus/Adnexae: Right ovary measures 3.8 x 2.5 x 2.8 cm. There is a minimally complex 1.9 cm right ovarian cyst with a thin internal septation. The left ovary measures 3.3 x 2.1 by 2.4 cm. No free fluid or pelvic mass. Pulsed Doppler evaluation of both  ovaries was performed. On the left, normal low resistance arterial and venous waveforms are documented. On the right, normal low resistance venous waveforms documented. Arterial waveform is difficult to demonstrate, felt to be technical as color flow is visualized within the right ovary on cine imaging. Other findings No free fluid. IMPRESSION: 1. Single live intrauterine pregnancy as above estimated age 54 weeks and 2 days. 2. Minimally complex right ovarian cyst or follicle measuring 1.9 cm. 3. Difficulty in documenting arterial waveform within the right ovary, felt to be technical. Normal color Doppler and venous waveform within the right ovary. Normal Doppler interrogation of the left ovary. This exam is performed on an emergent basis and does not comprehensively evaluate fetal size, dating, or anatomy; follow-up complete OB US should be considered if  further fetal assessment is warranted. Electronically Signed   By: Sharlet Salina M.D.   On: 01/08/2022 23:10    Procedures Procedures    Medications Ordered in ED Medications  sodium chloride 0.9 % bolus 1,000 mL (1,000 mLs Intravenous New Bag/Given 01/08/22 1942)    ED Course/ Medical Decision Making/ A&P                           Medical Decision Making Amount and/or Complexity of Data Reviewed Labs: ordered. Radiology: ordered.  Risk Prescription drug management.   This patient presents to the ED for concern of abdominal cramping, this involves an extensive number of treatment options, and is a complaint that carries with it a high risk of complications and morbidity.   Co morbidities that complicate the patient evaluation  [redacted] weeks pregnant  Lab Tests:  I Ordered, and personally interpreted labs.  The pertinent results include:  UA with some signs of infection, culture pending. Hcg 33,000   Imaging Studies ordered:  I ordered imaging studies including pelvic US  I independently visualized and interpreted imaging which showed   1. Single live intrauterine pregnancy as above estimated age 80 weeks and 2 days. 2. Minimally complex right ovarian cyst or follicle measuring 1.9 cm. 3. Difficulty in documenting arterial waveform within the right ovary, felt to be technical. Normal color Doppler and venous waveform within the right ovary. Normal Doppler interrogation of the left ovary. I agree with the radiologist interpretation   Cardiac Monitoring: / EKG:  The patient was maintained on a cardiac monitor.  I personally viewed and interpreted the cardiac monitored which showed an underlying rhythm of: NSR   Problem List / ED Course / Critical interventions / Medication management  I ordered medication including fluids  for dehydration  Reevaluation of the patient after these medicines showed that the patient improved I have reviewed the patients home medicines and have made adjustments as needed   Test / Admission - Considered:  Patient is nontoxic, nonseptic appearing, in no apparent distress.  Patient's pain and other symptoms adequately managed in emergency department.  Fluid bolus given.  Labs, imaging and vitals reviewed.  Patient does not meet the SIRS or Sepsis criteria.  On repeat exam patient does not have a surgical abdomin and there are no peritoneal signs.  Considered CT imaging for evaluation, however patients tenderness is minimal and her laboratory evaluation is reassuring. Therefore no indication of appendicitis, bowel obstruction, bowel perforation, cholecystitis, diverticulitis, PID or ectopic pregnancy.  Will defer imaging at this time. Symptoms more consistent with round ligament pain during pregnancy. UA does show some signs of infection, she denies any symptoms of UTI, however will cover for same given that she is pregnant. Patient discharged home and given strict instructions for follow-up with their OB/GYN.  I have also discussed reasons to return immediately to the ER.  Patient expresses  understanding and agrees with plan. Discharged in stable condition.  Findings and plan of care discussed with supervising physician Dr. Posey Rea who is in agreement.     Final Clinical Impression(s) / ED Diagnoses Final diagnoses:  Acute cystitis without hematuria  Round ligament pain    Rx / DC Orders ED Discharge Orders          Ordered    cephALEXin (KEFLEX) 500 MG capsule  3 times daily        01/08/22 2328  An After Visit Summary was printed and given to the patient.     Silva Bandy, PA-C 01/09/22 1446    Daiquan Resnik, Shawn Route, PA-C 01/09/22 1447    Glendora Score, MD 01/11/22 [phone]

## 2022-01-08 NOTE — ED Notes (Signed)
Ekg done at this time. Yvonne Stone

## 2022-01-08 NOTE — Discharge Instructions (Signed)
I have prescribed you an antibiotic for you to take for management of your urinary tract infection.  You may also take Tylenol for management of your belly pain.  Follow-up with your OB/GYN at your earliest convenience for continued evaluation and management of your symptoms.  Return if development of any new or worsening symptoms.

## 2022-01-08 NOTE — ED Triage Notes (Signed)
Pt states she is [redacted] weeks pregnant. Pt states she has had some dizziness, as well cramping. Denies any vaginal bleeding

## 2022-01-10 LAB — URINE CULTURE

## 2022-01-17 ENCOUNTER — Ambulatory Visit (INDEPENDENT_AMBULATORY_CARE_PROVIDER_SITE_OTHER): Payer: Medicaid Other | Admitting: Advanced Practice Midwife

## 2022-01-17 VITALS — BP 100/71 | HR 81 | Wt 216.0 lb

## 2022-01-17 DIAGNOSIS — Z3A16 16 weeks gestation of pregnancy: Secondary | ICD-10-CM

## 2022-01-17 DIAGNOSIS — Z3480 Encounter for supervision of other normal pregnancy, unspecified trimester: Secondary | ICD-10-CM

## 2022-01-17 DIAGNOSIS — Z363 Encounter for antenatal screening for malformations: Secondary | ICD-10-CM

## 2022-01-17 DIAGNOSIS — Z1379 Encounter for other screening for genetic and chromosomal anomalies: Secondary | ICD-10-CM

## 2022-01-17 NOTE — Progress Notes (Signed)
   LOW-RISK PREGNANCY VISIT Patient name: Yvonne Stone MRN [id number]  Date of birth: 1991-05-28 Chief Complaint:   Routine Prenatal Visit  History of Present Illness:   Yvonne Stone is a 31 y.o. [id number] female at [redacted]w[redacted]d with an Estimated Date of Delivery: 07/04/22 being seen today for ongoing management of a low-risk pregnancy.  Today she reports  constant low-grade abd discomfort- ?from cyst . Contractions: Not present. Vag. Bleeding: None.  Movement: Absent. denies leaking of fluid. Review of Systems:   Pertinent items are noted in HPI Denies abnormal vaginal discharge w/ itching/odor/irritation, headaches, visual changes, shortness of breath, chest pain, abdominal pain, severe nausea/vomiting, or problems with urination or bowel movements unless otherwise stated above. Pertinent History Reviewed:  Reviewed past medical,surgical, social, obstetrical and family history.  Reviewed problem list, medications and allergies. Physical Assessment:   Vitals:   01/17/22 0858  BP: 100/71  Pulse: 81  Weight: 216 lb (98 kg)  Body mass index is 35.94 kg/m.        Physical Examination:   General appearance: Well appearing, and in no distress  Mental status: Alert, oriented to person, place, and time  Skin: Warm & dry  Cardiovascular: Normal heart rate noted  Respiratory: Normal respiratory effort, no distress  Abdomen: Soft, gravid, nontender  Pelvic: Cervical exam deferred         Extremities: Edema: None  Fetal Status: Fetal Heart Rate (bpm): 156   Movement: Absent    No results found for this or any previous visit (from the past 24 hour(s)).  Assessment & Plan:  1) Low-risk pregnancy [id number] at [redacted]w[redacted]d with an Estimated Date of Delivery: 07/04/22   2) Ovarian cyst, ?dermoid, will f/u on anatomy u/s  3) Prev C/S x 2, plans for repeat   Meds: No orders of the defined types were placed in this encounter.  Labs/procedures today: 2nd IT  Plan:  Continue routine obstetrical care    Reviewed: Preterm labor symptoms and general obstetric precautions including but not limited to vaginal bleeding, contractions, leaking of fluid and fetal movement were reviewed in detail with the patient.  All questions were answered. Didn't ask about home bp cuff.  Check bp weekly, let us know if >140/90.   Follow-up: Return in about 4 weeks (around 02/14/2022) for LROB, [place]: Anatomy.  Orders Placed This Encounter  Procedures   US OB Comp + 14 Wk   INTEGRATED 2   Arabella Merles [facility] 01/17/2022 9:21 AM

## 2022-01-17 NOTE — Patient Instructions (Signed)
Arnisha, thank you for choosing our office today! We appreciate the opportunity to meet your healthcare needs. You may receive a short survey by mail, e-mail, or through MyChart. If you are happy with your care we would appreciate if you could take just a few minutes to complete the survey questions. We read all of your comments and take your feedback very seriously. Thank you again for choosing our office.  [facility] Team at [facility] ([address] [place], [place] [address]) Entrance C, located off of [address] Free 24/7 valet parking  Go to [email or web address] to register for FREE online childbirth classes  Call the office ([phone]) or go to [facility] if: You begin to severe cramping Your water breaks.  Sometimes it is a big gush of fluid, sometimes it is just a trickle that keeps getting your panties wet or running down your legs You have vaginal bleeding.  It is normal to have a small amount of spotting if your cervix was checked.   [place] [facility] ([facility]): [address], [phone]           [facility]: [address], [phone]                [facility] ([facility]): [address], [phone] (call to ask if accepting patients) [facility]: [address], [place], [phone]    [place] [facility]/Family Doctors [facility] ([facility]): [address], [phone] [facility]: [address], [phone] [facility]: [address], [phone]  [place] [facility]  [facility] ([facility]): [phone] [facility]: [address], [phone]   [place] [facility]: [address], [phone]  [place] [facility]  [facility]: [address], [phone]  Home Blood Pressure Monitoring for Patients   Your provider has recommended that you check your blood pressure (BP) at least once a week at home. If you do not have a blood pressure cuff at home, one will be provided for you. Contact your provider if you have not received your monitor within 1 week.   Helpful Tips for Accurate Home Blood Pressure Checks  Don't smoke, exercise, or drink caffeine 30 minutes before checking your BP Use the restroom before checking your BP (a full bladder can raise your pressure) Relax in a comfortable upright chair Feet on the ground Left arm resting comfortably on a flat surface at the level of your heart Legs uncrossed Back supported Sit quietly and don't talk Place the cuff on your bare arm Adjust snuggly, so that only two fingertips can fit between your skin and the top of the cuff Check 2 readings separated by at least one minute Keep a log of your BP readings For a visual, please reference this diagram: http://[facility].care/bpdiagram  Provider Name: [facility]     Phone: [phone]  Zone 1: ALL CLEAR  Continue to monitor your symptoms:  BP reading is less than 140 (top number) or less than 90 (bottom number)  No right upper stomach pain No headaches or seeing spots No feeling nauseated or throwing up No swelling in face and hands  Zone 2: CAUTION Call your doctor's office for any of the following:  BP reading is greater than 140 (top number) or greater than   90 (bottom number)  Stomach pain under your ribs in the middle or right side Headaches or seeing spots Feeling nauseated or throwing up Swelling in face and hands  Zone 3: EMERGENCY  Seek immediate medical care if you have any of the following:  BP reading is greater than160 (top number) or greater than 110 (bottom number) Severe headaches not improving with Tylenol Serious difficulty catching your breath Any worsening symptoms from  Zone 2     Second Trimester of Pregnancy The second trimester is from week 14 through week 27 (months 4 through 6). The second trimester is often a time when you feel your best. Your body has adjusted to being pregnant, and you begin to feel better physically. Usually, morning sickness has lessened or quit completely, you may have more energy, and you may have an increase in appetite. The second trimester is also a time when the fetus is growing rapidly. At the end of the sixth month, the fetus is about 9 inches long and weighs about 1 pounds. You will likely begin to feel the baby move (quickening) between 16 and 20 weeks of pregnancy. Body changes during your second trimester Your body continues to go through many changes during your second trimester. The changes vary from woman to woman. Your weight will continue to increase. You will notice your lower abdomen bulging out. You may begin to get stretch marks on your hips, abdomen, and breasts. You may develop headaches that can be relieved by medicines. The medicines should be approved by your health care provider. You may urinate more often because the fetus is pressing on your bladder. You may develop or continue to have heartburn as a result of your pregnancy. You may develop constipation because certain hormones are causing the muscles that push waste through your intestines to slow down. You may develop hemorrhoids or swollen, bulging veins (varicose veins). You may have back pain. This is caused by: Weight gain. Pregnancy hormones that are relaxing the joints in your pelvis. A shift in weight and the muscles that support your balance. Your breasts will continue to grow and they will continue to become tender. Your gums may bleed and may be sensitive to brushing and flossing. Dark spots or blotches (chloasma, mask of pregnancy) may develop on your face. This will likely fade after the baby is born. A dark line from your belly button to  the pubic area (linea nigra) may appear. This will likely fade after the baby is born. You may have changes in your hair. These can include thickening of your hair, rapid growth, and changes in texture. Some women also have hair loss during or after pregnancy, or hair that feels dry or thin. Your hair will most likely return to normal after your baby is born.  What to expect at prenatal visits During a routine prenatal visit: You will be weighed to make sure you and the fetus are growing normally. Your blood pressure will be taken. Your abdomen will be measured to track your baby's growth. The fetal heartbeat will be listened to. Any test results from the previous visit will be discussed.  Your health care provider may ask you: How you are feeling. If you are feeling the baby move. If you have had any abnormal symptoms, such as leaking fluid, bleeding, severe headaches, or abdominal cramping. If you are using any tobacco products, including cigarettes, chewing tobacco, and electronic cigarettes. If you have any questions.  Other tests that may be performed during   your second trimester include: Blood tests that check for: Low iron levels (anemia). High blood sugar that affects pregnant women (gestational diabetes) between 24 and 28 weeks. Rh antibodies. This is to check for a protein on red blood cells (Rh factor). Urine tests to check for infections, diabetes, or protein in the urine. An ultrasound to confirm the proper growth and development of the baby. An amniocentesis to check for possible genetic problems. Fetal screens for spina bifida and Down syndrome. HIV (human immunodeficiency virus) testing. Routine prenatal testing includes screening for HIV, unless you choose not to have this test.  Follow these instructions at home: Medicines Follow your health care provider's instructions regarding medicine use. Specific medicines may be either safe or unsafe to take during  pregnancy. Take a prenatal vitamin that contains at least 600 micrograms (mcg) of folic acid. If you develop constipation, try taking a stool softener if your health care provider approves. Eating and drinking Eat a balanced diet that includes fresh fruits and vegetables, whole grains, good sources of protein such as meat, eggs, or tofu, and low-fat dairy. Your health care provider will help you determine the amount of weight gain that is right for you. Avoid raw meat and uncooked cheese. These carry germs that can cause birth defects in the baby. If you have low calcium intake from food, talk to your health care provider about whether you should take a daily calcium supplement. Limit foods that are high in fat and processed sugars, such as fried and sweet foods. To prevent constipation: Drink enough fluid to keep your urine clear or pale yellow. Eat foods that are high in fiber, such as fresh fruits and vegetables, whole grains, and beans. Activity Exercise only as directed by your health care provider. Most women can continue their usual exercise routine during pregnancy. Try to exercise for 30 minutes at least 5 days a week. Stop exercising if you experience uterine contractions. Avoid heavy lifting, wear low heel shoes, and practice good posture. A sexual relationship may be continued unless your health care provider directs you otherwise. Relieving pain and discomfort Wear a good support bra to prevent discomfort from breast tenderness. Take warm sitz baths to soothe any pain or discomfort caused by hemorrhoids. Use hemorrhoid cream if your health care provider approves. Rest with your legs elevated if you have leg cramps or low back pain. If you develop varicose veins, wear support hose. Elevate your feet for 15 minutes, 3-4 times a day. Limit salt in your diet. Prenatal Care Write down your questions. Take them to your prenatal visits. Keep all your prenatal visits as told by your health  care provider. This is important. Safety Wear your seat belt at all times when driving. Make a list of emergency phone numbers, including numbers for family, friends, the hospital, and police and fire departments. General instructions Ask your health care provider for a referral to a local prenatal education class. Begin classes no later than the beginning of month 6 of your pregnancy. Ask for help if you have counseling or nutritional needs during pregnancy. Your health care provider can offer advice or refer you to specialists for help with various needs. Do not use hot tubs, steam rooms, or saunas. Do not douche or use tampons or scented sanitary pads. Do not cross your legs for long periods of time. Avoid cat litter boxes and soil used by cats. These carry germs that can cause birth defects in the baby and possibly loss of the   fetus by miscarriage or stillbirth. Avoid all smoking, herbs, alcohol, and unprescribed drugs. Chemicals in these products can affect the formation and growth of the baby. Do not use any products that contain nicotine or tobacco, such as cigarettes and e-cigarettes. If you need help quitting, ask your health care provider. Visit your dentist if you have not gone yet during your pregnancy. Use a soft toothbrush to brush your teeth and be gentle when you floss. Contact a health care provider if: You have dizziness. You have mild pelvic cramps, pelvic pressure, or nagging pain in the abdominal area. You have persistent nausea, vomiting, or diarrhea. You have a bad smelling vaginal discharge. You have pain when you urinate. Get help right away if: You have a fever. You are leaking fluid from your vagina. You have spotting or bleeding from your vagina. You have severe abdominal cramping or pain. You have rapid weight gain or weight loss. You have shortness of breath with chest pain. You notice sudden or extreme swelling of your face, hands, ankles, feet, or legs. You  have not felt your baby move in over an hour. You have severe headaches that do not go away when you take medicine. You have vision changes. Summary The second trimester is from week 14 through week 27 (months 4 through 6). It is also a time when the fetus is growing rapidly. Your body goes through many changes during pregnancy. The changes vary from woman to woman. Avoid all smoking, herbs, alcohol, and unprescribed drugs. These chemicals affect the formation and growth your baby. Do not use any tobacco products, such as cigarettes, chewing tobacco, and e-cigarettes. If you need help quitting, ask your health care provider. Contact your health care provider if you have any questions. Keep all prenatal visits as told by your health care provider. This is important. This information is not intended to replace advice given to you by your health care provider. Make sure you discuss any questions you have with your health care provider. Document Released: 05/17/2001 Document Revised: 10/29/2015 Document Reviewed: 07/24/2012 Elsevier Interactive Patient Education  2017 [employer]  

## 2022-02-01 ENCOUNTER — Other Ambulatory Visit (INDEPENDENT_AMBULATORY_CARE_PROVIDER_SITE_OTHER): Payer: Medicaid Other | Admitting: *Deleted

## 2022-02-01 ENCOUNTER — Other Ambulatory Visit (HOSPITAL_COMMUNITY)
Admission: RE | Admit: 2022-02-01 | Discharge: 2022-02-01 | Disposition: A | Discharge status: Home / Self Care | Payer: Federal, State, Local not specified - PPO | Source: Ambulatory Visit | Attending: Obstetrics & Gynecology | Admitting: Obstetrics & Gynecology

## 2022-02-01 DIAGNOSIS — N898 Other specified noninflammatory disorders of vagina: Secondary | ICD-10-CM | POA: Diagnosis not present

## 2022-02-01 DIAGNOSIS — O26892 Other specified pregnancy related conditions, second trimester: Secondary | ICD-10-CM | POA: Diagnosis not present

## 2022-02-01 NOTE — Progress Notes (Signed)
   NURSE VISIT- VAGINITIS/STD  SUBJECTIVE:  Yvonne Stone is a 31 y.o. [id number] [redacted]w[redacted]d pregnantfemale here for a vaginal swab for vaginitis screening, STD screen.  She reports the following symptoms: odor & discharge for 2 weeks. Denies abnormal vaginal bleeding, significant pelvic pain, fever, or UTI symptoms.  OBJECTIVE:  LMP 09/17/2021 (Exact Date)   Appears well, in no apparent distress  ASSESSMENT: Vaginal swab for vaginitis screening & STD screening.   PLAN: Self-collected vaginal probe for Gonorrhea, Chlamydia, Trichomonas, Bacterial Vaginosis, Yeast sent to lab Treatment: to be determined once results are received Follow-up as needed if symptoms persist/worsen, or new symptoms develop  Malachy Mood  02/01/2022 3:33 PM

## 2022-02-02 DIAGNOSIS — Z20822 Contact with and (suspected) exposure to covid-19: Secondary | ICD-10-CM | POA: Diagnosis not present

## 2022-02-02 DIAGNOSIS — R0981 Nasal congestion: Secondary | ICD-10-CM | POA: Diagnosis not present

## 2022-02-02 DIAGNOSIS — R059 Cough, unspecified: Secondary | ICD-10-CM | POA: Diagnosis not present

## 2022-02-02 DIAGNOSIS — B9789 Other viral agents as the cause of diseases classified elsewhere: Secondary | ICD-10-CM | POA: Diagnosis not present

## 2022-02-02 DIAGNOSIS — J988 Other specified respiratory disorders: Secondary | ICD-10-CM | POA: Diagnosis not present

## 2022-02-03 ENCOUNTER — Other Ambulatory Visit: Payer: Self-pay | Admitting: Adult Health

## 2022-02-03 LAB — CERVICOVAGINAL ANCILLARY ONLY
Bacterial Vaginitis (gardnerella): POSITIVE — AB
Candida Glabrata: NEGATIVE
Candida Vaginitis: POSITIVE — AB
Chlamydia: NEGATIVE
Comment: NEGATIVE
Comment: NEGATIVE
Comment: NEGATIVE
Comment: NEGATIVE
Comment: NEGATIVE
Comment: NORMAL
Neisseria Gonorrhea: NEGATIVE
Trichomonas: NEGATIVE

## 2022-02-03 MED ORDER — METRONIDAZOLE 500 MG PO TABS: 500.0000 mg | ORAL_TABLET | Freq: Two times a day (BID) | ORAL | 0 refills | Status: DC

## 2022-02-03 NOTE — Progress Notes (Signed)
+  BV and yeast on vaginal swab will rx flagyl and use monistat OTC, no sex or alcohol while taking flagyl

## 2022-02-14 ENCOUNTER — Ambulatory Visit (INDEPENDENT_AMBULATORY_CARE_PROVIDER_SITE_OTHER): Payer: Federal, State, Local not specified - PPO

## 2022-02-14 ENCOUNTER — Ambulatory Visit (INDEPENDENT_AMBULATORY_CARE_PROVIDER_SITE_OTHER): Payer: Federal, State, Local not specified - PPO | Admitting: Women's Health

## 2022-02-14 ENCOUNTER — Encounter: Payer: Self-pay | Admitting: Women's Health

## 2022-02-14 VITALS — BP 106/72 | HR 95 | Wt 219.0 lb

## 2022-02-14 DIAGNOSIS — Z3480 Encounter for supervision of other normal pregnancy, unspecified trimester: Secondary | ICD-10-CM

## 2022-02-14 DIAGNOSIS — Z3A20 20 weeks gestation of pregnancy: Secondary | ICD-10-CM

## 2022-02-14 DIAGNOSIS — Z3482 Encounter for supervision of other normal pregnancy, second trimester: Secondary | ICD-10-CM

## 2022-02-14 DIAGNOSIS — Z363 Encounter for antenatal screening for malformations: Secondary | ICD-10-CM | POA: Diagnosis not present

## 2022-02-14 DIAGNOSIS — Z8041 Family history of malignant neoplasm of ovary: Secondary | ICD-10-CM

## 2022-02-14 NOTE — Progress Notes (Signed)
[place] 20 wks,cephalic,FHR 159 bpm,cx 4.8 cm,posterior placenta gr 0,EFW 356 g 71%,normal left ovary,right dermoid N/C 5.1 X 2 X 3.5 CM,anatomy complete

## 2022-02-14 NOTE — Progress Notes (Signed)
LOW-RISK PREGNANCY VISIT Patient name: Yvonne Stone MRN [id number]  Date of birth: 01/23/1991 Chief Complaint:   Routine Prenatal Visit (Anatomy scan)  History of Present Illness:   Yvonne Stone is a 31 y.o. Q7Y1950 female at [redacted]w[redacted]d with an Estimated Date of Delivery: 07/04/22 being seen today for ongoing management of a low-risk pregnancy.   Today she reports no complaints. Contractions: Not present. Vag. Bleeding: None.  Movement: Present. denies leaking of fluid.     12/27/2021    2:46 PM 12/16/2016   11:50 AM 12/16/2016   11:49 AM 04/11/2016    4:34 PM  Depression screen PHQ 2/9  Decreased Interest 2 1 1 3   Down, Depressed, Hopeless 2 1 1 3   PHQ - 2 Score 4 2 2 6   Altered sleeping 2 2  2   Tired, decreased energy 2 2  2   Change in appetite 2 2  3   Feeling bad or failure about yourself  2 1  3   Trouble concentrating 2 1  2   Moving slowly or fidgety/restless 0 1  2  Suicidal thoughts 0 0  2  PHQ-9 Score 14 11  22         12/27/2021    2:46 PM  GAD 7 : Generalized Anxiety Score  Nervous, Anxious, on Edge 2  Control/stop worrying 2  Worry too much - different things 2  Trouble relaxing 2  Restless 0  Easily annoyed or irritable 2  Afraid - awful might happen 2  Total GAD 7 Score 12      Review of Systems:   Pertinent items are noted in HPI Denies abnormal vaginal discharge w/ itching/odor/irritation, headaches, visual changes, shortness of breath, chest pain, abdominal pain, severe nausea/vomiting, or problems with urination or bowel movements unless otherwise stated above. Pertinent History Reviewed:  Reviewed past medical,surgical, social, obstetrical and family history.  Reviewed problem list, medications and allergies. Physical Assessment:   Vitals:   02/14/22 1620  BP: 106/72  Pulse: 95  Weight: 219 lb (99.3 kg)  Body mass index is 36.44 kg/m.        Physical Examination:   General appearance: Well appearing, and in no distress  Mental status:  Alert, oriented to person, place, and time  Skin: Warm & dry  Cardiovascular: Normal heart rate noted  Respiratory: Normal respiratory effort, no distress  Abdomen: Soft, gravid, nontender  Pelvic: Cervical exam deferred         Extremities: Edema: None  Fetal Status:     Movement: Present  20 wks,cephalic,FHR 159 bpm,cx 4.8 cm,posterior placenta gr 0,EFW 356 g 71%,normal left ovary,right dermoid N/C 5.1 X 2 X 3.5 CM,anatomy complete  Chaperone: N/A   No results found for this or any previous visit (from the past 24 hour(s)).  Assessment & Plan:  1) Low-risk pregnancy at [redacted]w[redacted]d with an Estimated Date of Delivery: 07/04/22   2) Prev c/s x 2, for RCS  3) Rt dermoid cyst 5.1cm- stable w/o change  4) Family h/o ovarian cancer> pt's mom, who is present, reports +genetic screen (doesn't recall what for, will check and have pt let know)   Meds: No orders of the defined types were placed in this encounter.  Labs/procedures today: U/S, wants to do 2nd IT tomorrow  Plan:  Continue routine obstetrical care  Next visit: prefers in person    Reviewed: Preterm labor symptoms and general obstetric precautions including but not limited to vaginal bleeding, contractions, leaking of fluid  and fetal movement were reviewed in detail with the patient.  All questions were answered. Does have home bp cuff. Office bp cuff given: not applicable. Check bp weekly, let us know if consistently >140 and/or >90.  Follow-up: Return in about 4 weeks (around 03/14/2022) for LROB, in person.  Future Appointments  Date Time Provider Department Center  03/15/2022  8:50 AM Myna Hidalgo, DO [facility]    No orders of the defined types were placed in this encounter.  Cheral Marker CNM, [facility] 02/14/2022 5:02 PM

## 2022-02-14 NOTE — Patient Instructions (Signed)
Yvonne Stone, thank you for choosing our office today! We appreciate the opportunity to meet your healthcare needs. You may receive a short survey by mail, e-mail, or through [employer]. If you are happy with your care we would appreciate if you could take just a few minutes to complete the survey questions. We read all of your comments and take your feedback very seriously. Thank you again for choosing our office.  Center for [employer] Team at [facility] ([address] [place], [place] [address]) Entrance C, located off of [address] Free 24/7 valet parking  Go to [email or web address] to register for FREE online childbirth classes  Call the office [phone]) or go to [facility] if: You begin to severe cramping Your water breaks.  Sometimes it is a big gush of fluid, sometimes it is just a trickle that keeps getting your panties wet or running down your legs You have vaginal bleeding.  It is normal to have a small amount of spotting if your cervix was checked.   [facility] Pediatricians/Family Doctors [facility]): [address] Colette Ribas, [phone]           [facility]: [address], [phone]                [facility]): [address], [phone] (call to ask if accepting patients) [facility]: [address], [place], [phone]    [facility] Pediatricians/Family Doctors [facility]): [phone] [address], [phone] [facility]: [address] [place], [phone] [facility]: [address], [phone]  [facility] Doctors  [facility]): [phone] [facility]: [address] [phone]   [facility] Doctors [facility]: [address], [phone]  [facility] Doctors  [facility]: [phone], [phone]  Home Blood Pressure Monitoring for Patients   Your provider has recommended that you check your blood pressure (BP) at least once a week at home. If you do not have a blood pressure cuff at home, one will be provided for you. Contact your provider if you have not received your monitor within 1 week.   Helpful Tips for Accurate Home Blood Pressure Checks  Don't smoke, exercise, or drink caffeine 30 minutes before checking your BP Use the restroom before checking your BP (a full bladder can raise your pressure) Relax in a comfortable upright chair Feet on the ground Left arm resting comfortably on a flat surface at the level of your heart Legs uncrossed Back supported Sit quietly and don't talk Place the cuff on your bare arm Adjust snuggly, so that only two fingertips can fit between your skin and the top of the cuff Check 2 readings separated by at least one minute Keep a log of your BP readings For a visual, please reference this diagram: http://ccnc.care/bpdiagram  Provider Name: [facility]     Phone: [phone]  Zone 1: ALL CLEAR  Continue to monitor your symptoms:  BP reading is less than 140 (top number) or less than 90 (bottom number)  No right upper stomach pain No headaches or seeing spots No feeling nauseated or throwing up No swelling in face and hands  Zone 2: CAUTION Call your doctor's office for any of the following:  BP reading is greater than 140 (top number) or greater than  90 (bottom number)  Stomach pain under your ribs in the middle or right side Headaches or seeing spots Feeling nauseated or throwing up Swelling in face and hands  Zone 3: EMERGENCY  Seek immediate medical care if you have any of the following:  BP reading is greater than160 (top number) or greater than 110 (bottom number) Severe headaches not improving with Tylenol Serious difficulty catching your breath Any worsening symptoms from  Zone 2     Second Trimester of Pregnancy The second trimester is from week 14 through week 27 (months 4 through 6). The second trimester is often a time when you feel your best. Your body has adjusted to being pregnant, and you begin to feel better physically. Usually, morning sickness has lessened or quit completely, you may have more energy, and you may have an increase in appetite. The second trimester is also a time when the fetus is growing rapidly. At the end of the sixth month, the fetus is about 9 inches long and weighs about 1 pounds. You will likely begin to feel the baby move (quickening) between 16 and 20 weeks of pregnancy. Body changes during your second trimester Your body continues to go through many changes during your second trimester. The changes vary from woman to woman. Your weight will continue to increase. You will notice your lower abdomen bulging out. You may begin to get stretch marks on your hips, abdomen, and breasts. You may develop headaches that can be relieved by medicines. The medicines should be approved by your health care provider. You may urinate more often because the fetus is pressing on your bladder. You may develop or continue to have heartburn as a result of your pregnancy. You may develop constipation because certain hormones are causing the muscles that push waste through your intestines to slow down. You may develop hemorrhoids or swollen, bulging veins (varicose veins). You may have back pain. This is caused by: Weight gain. Pregnancy hormones that are relaxing the joints in your pelvis. A shift in weight and the muscles that support your balance. Your breasts will continue to grow and they will continue to become tender. Your gums may bleed and may be sensitive to brushing and flossing. Dark spots or blotches (chloasma, mask of pregnancy) may develop on your face. This will likely fade after the baby is born. A dark line from your belly button to  the pubic area (linea nigra) may appear. This will likely fade after the baby is born. You may have changes in your hair. These can include thickening of your hair, rapid growth, and changes in texture. Some women also have hair loss during or after pregnancy, or hair that feels dry or thin. Your hair will most likely return to normal after your baby is born.  What to expect at prenatal visits During a routine prenatal visit: You will be weighed to make sure you and the fetus are growing normally. Your blood pressure will be taken. Your abdomen will be measured to track your baby's growth. The fetal heartbeat will be listened to. Any test results from the previous visit will be discussed.  Your health care provider may ask you: How you are feeling. If you are feeling the baby move. If you have had any abnormal symptoms, such as leaking fluid, bleeding, severe headaches, or abdominal cramping. If you are using any tobacco products, including cigarettes, chewing tobacco, and electronic cigarettes. If you have any questions.  Other tests that may be performed during   your second trimester include: Blood tests that check for: Low iron levels (anemia). High blood sugar that affects pregnant women (gestational diabetes) between 24 and 28 weeks. Rh antibodies. This is to check for a protein on red blood cells (Rh factor). Urine tests to check for infections, diabetes, or protein in the urine. An ultrasound to confirm the proper growth and development of the baby. An amniocentesis to check for possible genetic problems. Fetal screens for spina bifida and Down syndrome. HIV (human immunodeficiency virus) testing. Routine prenatal testing includes screening for HIV, unless you choose not to have this test.  Follow these instructions at home: Medicines Follow your health care provider's instructions regarding medicine use. Specific medicines may be either safe or unsafe to take during  pregnancy. Take a prenatal vitamin that contains at least 600 micrograms (mcg) of folic acid. If you develop constipation, try taking a stool softener if your health care provider approves. Eating and drinking Eat a balanced diet that includes fresh fruits and vegetables, whole grains, good sources of protein such as meat, eggs, or tofu, and low-fat dairy. Your health care provider will help you determine the amount of weight gain that is right for you. Avoid raw meat and uncooked cheese. These carry germs that can cause birth defects in the baby. If you have low calcium intake from food, talk to your health care provider about whether you should take a daily calcium supplement. Limit foods that are high in fat and processed sugars, such as fried and sweet foods. To prevent constipation: Drink enough fluid to keep your urine clear or pale yellow. Eat foods that are high in fiber, such as fresh fruits and vegetables, whole grains, and beans. Activity Exercise only as directed by your health care provider. Most women can continue their usual exercise routine during pregnancy. Try to exercise for 30 minutes at least 5 days a week. Stop exercising if you experience uterine contractions. Avoid heavy lifting, wear low heel shoes, and practice good posture. A sexual relationship may be continued unless your health care provider directs you otherwise. Relieving pain and discomfort Wear a good support bra to prevent discomfort from breast tenderness. Take warm sitz baths to soothe any pain or discomfort caused by hemorrhoids. Use hemorrhoid cream if your health care provider approves. Rest with your legs elevated if you have leg cramps or low back pain. If you develop varicose veins, wear support hose. Elevate your feet for 15 minutes, 3-4 times a day. Limit salt in your diet. Prenatal Care Write down your questions. Take them to your prenatal visits. Keep all your prenatal visits as told by your health  care provider. This is important. Safety Wear your seat belt at all times when driving. Make a list of emergency phone numbers, including numbers for family, friends, the hospital, and police and fire departments. General instructions Ask your health care provider for a referral to a local prenatal education class. Begin classes no later than the beginning of month 6 of your pregnancy. Ask for help if you have counseling or nutritional needs during pregnancy. Your health care provider can offer advice or refer you to specialists for help with various needs. Do not use hot tubs, steam rooms, or saunas. Do not douche or use tampons or scented sanitary pads. Do not cross your legs for long periods of time. Avoid cat litter boxes and soil used by cats. These carry germs that can cause birth defects in the baby and possibly loss of the   fetus by miscarriage or stillbirth. Avoid all smoking, herbs, alcohol, and unprescribed drugs. Chemicals in these products can affect the formation and growth of the baby. Do not use any products that contain nicotine or tobacco, such as cigarettes and e-cigarettes. If you need help quitting, ask your health care provider. Visit your dentist if you have not gone yet during your pregnancy. Use a soft toothbrush to brush your teeth and be gentle when you floss. Contact a health care provider if: You have dizziness. You have mild pelvic cramps, pelvic pressure, or nagging pain in the abdominal area. You have persistent nausea, vomiting, or diarrhea. You have a bad smelling vaginal discharge. You have pain when you urinate. Get help right away if: You have a fever. You are leaking fluid from your vagina. You have spotting or bleeding from your vagina. You have severe abdominal cramping or pain. You have rapid weight gain or weight loss. You have shortness of breath with chest pain. You notice sudden or extreme swelling of your face, hands, ankles, feet, or legs. You  have not felt your baby move in over an hour. You have severe headaches that do not go away when you take medicine. You have vision changes. Summary The second trimester is from week 14 through week 27 (months 4 through 6). It is also a time when the fetus is growing rapidly. Your body goes through many changes during pregnancy. The changes vary from woman to woman. Avoid all smoking, herbs, alcohol, and unprescribed drugs. These chemicals affect the formation and growth your baby. Do not use any tobacco products, such as cigarettes, chewing tobacco, and e-cigarettes. If you need help quitting, ask your health care provider. Contact your health care provider if you have any questions. Keep all prenatal visits as told by your health care provider. This is important. This information is not intended to replace advice given to you by your health care provider. Make sure you discuss any questions you have with your health care provider. Document Released: 05/17/2001 Document Revised: 10/29/2015 Document Reviewed: 07/24/2012 Elsevier Interactive Patient Education  2017 [employer].  

## 2022-03-01 ENCOUNTER — Other Ambulatory Visit: Payer: Self-pay | Admitting: Women's Health

## 2022-03-01 MED ORDER — METRONIDAZOLE 0.75 % VA GEL
VAGINAL | 6 refills | Status: DC
Start: 2022-03-01 — End: 2022-05-02

## 2022-03-08 ENCOUNTER — Telehealth (HOSPITAL_COMMUNITY): Payer: Self-pay

## 2022-03-08 NOTE — Telephone Encounter (Signed)
Spoke with pt to schedule appt, pt states that she has already established and had her np appt with psych elsewhere

## 2022-03-15 ENCOUNTER — Ambulatory Visit (INDEPENDENT_AMBULATORY_CARE_PROVIDER_SITE_OTHER): Payer: Medicaid Other | Admitting: Obstetrics & Gynecology

## 2022-03-15 ENCOUNTER — Encounter: Payer: Self-pay | Admitting: Obstetrics & Gynecology

## 2022-03-15 DIAGNOSIS — Z3480 Encounter for supervision of other normal pregnancy, unspecified trimester: Secondary | ICD-10-CM

## 2022-03-15 NOTE — Progress Notes (Signed)
   LOW-RISK PREGNANCY VISIT Patient name: Yvonne Stone DOBBIN MRN [id number]  Date of birth: 22-Oct-1990 Chief Complaint:   Routine Prenatal Visit  History of Present Illness:   Yvonne Stone is a 31 y.o. [id number] female at [redacted]w[redacted]d with an Estimated Date of Delivery: 07/04/22 being seen today for ongoing management of a low-risk pregnancy.   Prior C-section x2 Right dermoid cyst     12/27/2021    2:46 PM 12/16/2016   11:50 AM 12/16/2016   11:49 AM 04/11/2016    4:34 PM  Depression screen PHQ 2/9  Decreased Interest 2 1 1 3   Down, Depressed, Hopeless 2 1 1 3   PHQ - 2 Score 4 2 2 6   Altered sleeping 2 2  2   Tired, decreased energy 2 2  2   Change in appetite 2 2  3   Feeling bad or failure about yourself  2 1  3   Trouble concentrating 2 1  2   Moving slowly or fidgety/restless 0 1  2  Suicidal thoughts 0 0  2  PHQ-9 Score 14 11  22     Today she reports no complaints. Contractions: Not present. Vag. Bleeding: None.  Movement: Present. denies leaking of fluid. Review of Systems:   Pertinent items are noted in HPI Denies abnormal vaginal discharge w/ itching/odor/irritation, headaches, visual changes, shortness of breath, chest pain, abdominal pain, severe nausea/vomiting, or problems with urination or bowel movements unless otherwise stated above. Pertinent History Reviewed:  Reviewed past medical,surgical, social, obstetrical and family history.  Reviewed problem list, medications and allergies.  Physical Assessment:   Vitals:   03/15/22 0850  BP: (!) 106/59  Pulse: 77  Weight: 224 lb 4 oz (101.7 kg)  Body mass index is 37.32 kg/m.        Physical Examination:   General appearance: Well appearing, and in no distress  Mental status: Alert, oriented to person, place, and time  Skin: Warm & dry  Respiratory: Normal respiratory effort, no distress  Abdomen: Soft, gravid, nontender  Pelvic: Cervical exam deferred         Extremities: Edema: None  Psych:  mood and affect  appropriate  Fetal Status: Fetal Heart Rate (bpm): 140 Fundal Height: 26 cm Movement: Present    Chaperone: n/a    No results found for this or any previous visit (from the past 24 hour(s)).   Assessment & Plan:  1) Low-risk pregnancy [id number] at [redacted]w[redacted]d with an Estimated Date of Delivery: 07/04/22   2) Prior C-section x 2, Dermoid cyst Discussed plan for removal of dermoid at time of procedure- ideally cystectomy and possible oophorectomy.  Questions and concerns were addressed   Meds: No orders of the defined types were placed in this encounter.  Labs/procedures today: none, PN2 next visit  Plan:  Continue routine obstetrical care  Next visit: prefers in person    Reviewed: Preterm labor symptoms and general obstetric precautions including but not limited to vaginal bleeding, contractions, leaking of fluid and fetal movement were reviewed in detail with the patient.  All questions were answered.   Follow-up: Return in about 3 weeks (around 04/05/2022) for [place] visit and PN-2.  No orders of the defined types were placed in this encounter.   Janyth Pupa, DO Attending [place], [facility] for [employer], [place]

## 2022-03-28 ENCOUNTER — Other Ambulatory Visit: Payer: Self-pay | Admitting: Women's Health

## 2022-03-28 MED ORDER — PRENATAL VITAMIN 27-0.8 MG PO TABS: ORAL_TABLET | ORAL | 3 refills | Status: DC

## 2022-04-04 ENCOUNTER — Encounter: Payer: Medicaid Other | Admitting: Obstetrics & Gynecology

## 2022-04-04 ENCOUNTER — Other Ambulatory Visit: Payer: Medicaid Other

## 2022-04-07 DIAGNOSIS — Z3A00 Weeks of gestation of pregnancy not specified: Secondary | ICD-10-CM | POA: Diagnosis not present

## 2022-04-07 DIAGNOSIS — O368190 Decreased fetal movements, unspecified trimester, not applicable or unspecified: Secondary | ICD-10-CM | POA: Diagnosis not present

## 2022-04-11 ENCOUNTER — Encounter: Payer: Self-pay | Admitting: Women's Health

## 2022-04-11 ENCOUNTER — Ambulatory Visit (INDEPENDENT_AMBULATORY_CARE_PROVIDER_SITE_OTHER): Payer: Medicaid Other | Admitting: Women's Health

## 2022-04-11 VITALS — BP 107/66 | HR 74 | Wt 232.6 lb

## 2022-04-11 DIAGNOSIS — Z3483 Encounter for supervision of other normal pregnancy, third trimester: Secondary | ICD-10-CM

## 2022-04-11 DIAGNOSIS — Z3480 Encounter for supervision of other normal pregnancy, unspecified trimester: Secondary | ICD-10-CM

## 2022-04-11 NOTE — Patient Instructions (Signed)
Yvonne Stone, thank you for choosing our office today! We appreciate the opportunity to meet your healthcare needs. You may receive a short survey by mail, e-mail, or through [employer]. If you are happy with your care we would appreciate if you could take just a few minutes to complete the survey questions. We read all of your comments and take your feedback very seriously. Thank you again for choosing our office.  Center for [employer] Team at [place] at [facility] ([place], [place] [address]) Entrance C, located off of [address] parking   CLASSES: Go to [email or web address] to register for classes (childbirth, breastfeeding, waterbirth, infant CPR, daddy bootcamp, etc.)  Call the office [phone]) or go to [facility] if: You begin to have strong, frequent contractions Your water breaks.  Sometimes it is a big gush of fluid, sometimes it is just a trickle that keeps getting your panties wet or running down your legs You have vaginal bleeding.  It is normal to have a small amount of spotting if your cervix was checked.  You don't feel your baby moving like normal.  If you don't, get you something to eat and drink and lay down and focus on feeling your baby move.   If your baby is still not moving like normal, you should call the office or go to [facility].  Call the office [phone]) or go to [facility] hospital for these signs of pre-eclampsia: Severe headache that does not go away with Tylenol Visual changes- seeing spots, double, blurred vision Pain under your right breast or upper abdomen that does not go away with Tums or heartburn medicine Nausea and/or vomiting Severe swelling in your hands, feet, and face   Tdap Vaccine It is recommended that you get the Tdap vaccine during the third trimester of EACH pregnancy to help protect your baby from getting pertussis (whooping cough) 27-36 weeks is the BEST time to do  this so that you can pass the protection on to your baby. During pregnancy is better than after pregnancy, but if you are unable to get it during pregnancy it will be offered at the hospital.  You can get this vaccine with [place], at the health department, your family doctor, or some local pharmacies Everyone who will be around your baby should also be up-to-date on their vaccines before the baby comes. Adults (who are not pregnant) only need 1 dose of Tdap during adulthood.   [facility] Pediatricians/Family Doctors [facility]): [address] [place], [facility]: [address] [place], [phone]                [facility]): [place], [phone] (call to ask if accepting patients) [facility] Department: [address], [place], [place] Pediatricians/Family Doctors [facility]): [place]. [place], [address], [place] [facility]: [address] [place], [place] [facility]: [place], [place] [facility]): [phone] [facility]: [address], [place]: [address] [facility]: [phone], [phone]  Home Blood Pressure Monitoring for Patients   Your provider has recommended that you check your  blood pressure (BP) at least once a week at home. If you do not have a blood pressure cuff at home, one will be provided for you. Contact your provider if you have not received your monitor within 1 week.   Helpful Tips for Accurate Home Blood Pressure Checks  Don't smoke, exercise, or drink caffeine 30 minutes before checking your BP Use the restroom before checking your BP (a full bladder can raise your  pressure) Relax in a comfortable upright chair Feet on the ground Left arm resting comfortably on a flat surface at the level of your heart Legs uncrossed Back supported Sit quietly and don't talk Place the cuff on your bare arm Adjust snuggly, so that only two fingertips can fit between your skin and the top of the cuff Check 2 readings separated by at least one minute Keep a log of your BP readings For a visual, please reference this diagram: http://ccnc.care/bpdiagram  Provider Name: [facility]     Phone: [phone]  Zone 1: ALL CLEAR  Continue to monitor your symptoms:  BP reading is less than 140 (top number) or less than 90 (bottom number)  No right upper stomach pain No headaches or seeing spots No feeling nauseated or throwing up No swelling in face and hands  Zone 2: CAUTION Call your doctor's office for any of the following:  BP reading is greater than 140 (top number) or greater than 90 (bottom number)  Stomach pain under your ribs in the middle or right side Headaches or seeing spots Feeling nauseated or throwing up Swelling in face and hands  Zone 3: EMERGENCY  Seek immediate medical care if you have any of the following:  BP reading is greater than160 (top number) or greater than 110 (bottom number) Severe headaches not improving with Tylenol Serious difficulty catching your breath Any worsening symptoms from Zone 2   Third Trimester of Pregnancy The third trimester is from week 29 through week 42, months 7 through 9. The third trimester is a time when the fetus is growing rapidly. At the end of the ninth month, the fetus is about 20 inches in length and weighs 6-10 pounds.  BODY CHANGES Your body goes through many changes during pregnancy. The changes vary from woman to woman.  Your weight will continue to increase. You can expect to gain 25-35 pounds (11-16 kg) by the end of the pregnancy. You may begin to get stretch marks on your hips, abdomen,  and breasts. You may urinate more often because the fetus is moving lower into your pelvis and pressing on your bladder. You may develop or continue to have heartburn as a result of your pregnancy. You may develop constipation because certain hormones are causing the muscles that push waste through your intestines to slow down. You may develop hemorrhoids or swollen, bulging veins (varicose veins). You may have pelvic pain because of the weight gain and pregnancy hormones relaxing your joints between the bones in your pelvis. Backaches may result from overexertion of the muscles supporting your posture. You may have changes in your hair. These can include thickening of your hair, rapid growth, and changes in texture. Some women also have hair loss during or after pregnancy, or hair that feels dry or thin. Your hair will most likely return to normal after your baby is born. Your breasts will continue to grow and be tender. A yellow discharge may leak from your breasts called colostrum. Your belly button may stick out. You may   feel short of breath because of your expanding uterus. You may notice the fetus "dropping," or moving lower in your abdomen. You may have a bloody mucus discharge. This usually occurs a few days to a week before labor begins. Your cervix becomes thin and soft (effaced) near your due date. WHAT TO EXPECT AT YOUR PRENATAL EXAMS  You will have prenatal exams every 2 weeks until week 36. Then, you will have weekly prenatal exams. During a routine prenatal visit: You will be weighed to make sure you and the fetus are growing normally. Your blood pressure is taken. Your abdomen will be measured to track your baby's growth. The fetal heartbeat will be listened to. Any test results from the previous visit will be discussed. You may have a cervical check near your due date to see if you have effaced. At around 36 weeks, your caregiver will check your cervix. At the same time, your  caregiver will also perform a test on the secretions of the vaginal tissue. This test is to determine if a type of bacteria, Group B streptococcus, is present. Your caregiver will explain this further. Your caregiver may ask you: What your birth plan is. How you are feeling. If you are feeling the baby move. If you have had any abnormal symptoms, such as leaking fluid, bleeding, severe headaches, or abdominal cramping. If you have any questions. Other tests or screenings that may be performed during your third trimester include: Blood tests that check for low iron levels (anemia). Fetal testing to check the health, activity level, and growth of the fetus. Testing is done if you have certain medical conditions or if there are problems during the pregnancy. FALSE LABOR You may feel small, irregular contractions that eventually go away. These are called Braxton Hicks contractions, or false labor. Contractions may last for hours, days, or even weeks before true labor sets in. If contractions come at regular intervals, intensify, or become painful, it is best to be seen by your caregiver.  SIGNS OF LABOR  Menstrual-like cramps. Contractions that are 5 minutes apart or less. Contractions that start on the top of the uterus and spread down to the lower abdomen and back. A sense of increased pelvic pressure or back pain. A watery or bloody mucus discharge that comes from the vagina. If you have any of these signs before the 37th week of pregnancy, call your caregiver right away. You need to go to the hospital to get checked immediately. HOME CARE INSTRUCTIONS  Avoid all smoking, herbs, alcohol, and unprescribed drugs. These chemicals affect the formation and growth of the baby. Follow your caregiver's instructions regarding medicine use. There are medicines that are either safe or unsafe to take during pregnancy. Exercise only as directed by your caregiver. Experiencing uterine cramps is a good sign to  stop exercising. Continue to eat regular, healthy meals. Wear a good support bra for breast tenderness. Do not use hot tubs, steam rooms, or saunas. Wear your seat belt at all times when driving. Avoid raw meat, uncooked cheese, cat litter boxes, and soil used by cats. These carry germs that can cause birth defects in the baby. Take your prenatal vitamins. Try taking a stool softener (if your caregiver approves) if you develop constipation. Eat more high-fiber foods, such as fresh vegetables or fruit and whole grains. Drink plenty of fluids to keep your urine clear or pale yellow. Take warm sitz baths to soothe any pain or discomfort caused by hemorrhoids. Use hemorrhoid cream if   your caregiver approves. If you develop varicose veins, wear support hose. Elevate your feet for 15 minutes, 3-4 times a day. Limit salt in your diet. Avoid heavy lifting, wear low heal shoes, and practice good posture. Rest a lot with your legs elevated if you have leg cramps or low back pain. Visit your dentist if you have not gone during your pregnancy. Use a soft toothbrush to brush your teeth and be gentle when you floss. A sexual relationship may be continued unless your caregiver directs you otherwise. Do not travel far distances unless it is absolutely necessary and only with the approval of your caregiver. Take prenatal classes to understand, practice, and ask questions about the labor and delivery. Make a trial run to the hospital. Pack your hospital bag. Prepare the baby's nursery. Continue to go to all your prenatal visits as directed by your caregiver. SEEK MEDICAL CARE IF: You are unsure if you are in labor or if your water has broken. You have dizziness. You have mild pelvic cramps, pelvic pressure, or nagging pain in your abdominal area. You have persistent nausea, vomiting, or diarrhea. You have a bad smelling vaginal discharge. You have pain with urination. SEEK IMMEDIATE MEDICAL CARE IF:  You  have a fever. You are leaking fluid from your vagina. You have spotting or bleeding from your vagina. You have severe abdominal cramping or pain. You have rapid weight loss or gain. You have shortness of breath with chest pain. You notice sudden or extreme swelling of your face, hands, ankles, feet, or legs. You have not felt your baby move in over an hour. You have severe headaches that do not go away with medicine. You have vision changes. Document Released: 05/17/2001 Document Revised: 05/28/2013 Document Reviewed: 07/24/2012 ExitCare Patient Information 2015 [employer]. This information is not intended to replace advice given to you by your health care provider. Make sure you discuss any questions you have with your health care provider.       

## 2022-04-11 NOTE — Progress Notes (Signed)
LOW-RISK PREGNANCY VISIT Patient name: Yvonne Stone MRN [id number]  Date of birth: 1990-07-15 Chief Complaint:   Routine Prenatal Visit  History of Present Illness:   Yvonne Stone is a 31 y.o. [id number] female at [redacted]w[redacted]d with an Estimated Date of Delivery: 07/04/22 being seen today for ongoing management of a low-risk pregnancy.   Today she reports doesn't know if metrogel 2x/wk is helping chronic BV. Reports odor and discharge. Hasn't had sex in months. No itching/irritation. Last CV swab 8/29 +for BV and candida only.  Contractions: Not present. Vag. Bleeding: None.  Movement: Present. denies leaking of fluid.     12/27/2021    2:46 PM 12/16/2016   11:50 AM 12/16/2016   11:49 AM 04/11/2016    4:34 PM  Depression screen PHQ 2/9  Decreased Interest 2 1 1 3   Down, Depressed, Hopeless 2 1 1 3   PHQ - 2 Score 4 2 2 6   Altered sleeping 2 2  2   Tired, decreased energy 2 2  2   Change in appetite 2 2  3   Feeling bad or failure about yourself  2 1  3   Trouble concentrating 2 1  2   Moving slowly or fidgety/restless 0 1  2  Suicidal thoughts 0 0  2  PHQ-9 Score 14 11  22         12/27/2021    2:46 PM  GAD 7 : Generalized Anxiety Score  Nervous, Anxious, on Edge 2  Control/stop worrying 2  Worry too much - different things 2  Trouble relaxing 2  Restless 0  Easily annoyed or irritable 2  Afraid - awful might happen 2  Total GAD 7 Score 12      Review of Systems:   Pertinent items are noted in HPI Denies abnormal vaginal discharge w/ itching/odor/irritation, headaches, visual changes, shortness of breath, chest pain, abdominal pain, severe nausea/vomiting, or problems with urination or bowel movements unless otherwise stated above. Pertinent History Reviewed:  Reviewed past medical,surgical, social, obstetrical and family history.  Reviewed problem list, medications and allergies. Physical Assessment:   Vitals:   04/11/22 0916  BP: 107/66  Pulse: 74  Weight: 232 lb 9.6  oz (105.5 kg)  Body mass index is 38.71 kg/m.        Physical Examination:   General appearance: Well appearing, and in no distress  Mental status: Alert, oriented to person, place, and time  Skin: Warm & dry  Cardiovascular: Normal heart rate noted  Respiratory: Normal respiratory effort, no distress  Abdomen: Soft, gravid, nontender  Pelvic: Cervical exam deferred         Extremities: Edema: None  Fetal Status: Fetal Heart Rate (bpm): 149 Fundal Height: 30 cm Movement: Present    Chaperone: N/A   No results found for this or any previous visit (from the past 24 hour(s)).  Assessment & Plan:  1) Low-risk pregnancy [id number] at [redacted]w[redacted]d with an Estimated Date of Delivery: 07/04/22   2) Recurrent vaginal d/c and self-perceived odor, last CV swab +BV and yeast on 8/29, no sex since. Currently trialing metrogel 2x/wk. Discussed sense of smell heightened during pregnancy and hormonal changes can increase discharge- could be normal and not BV. Can trial coming off of metrogel since she hasn't noticed an improvement, if she definitely feels she has BV/sx of infection after that, let us know and will repeat CV swab  3) Prev c/s x 2> for RCS w/ Rt dermoid cyst removal   Meds: No orders of  the defined types were placed in this encounter.  Labs/procedures today: declined flu shot and declined tdap, balance w/ [facility]- Amanda putting in pn2 orders at [employer]:  Continue routine obstetrical care  Next visit: prefers in person    Reviewed: Preterm labor symptoms and general obstetric precautions including but not limited to vaginal bleeding, contractions, leaking of fluid and fetal movement were reviewed in detail with the patient.  All questions were answered. Does have home bp cuff. Office bp cuff given: not applicable. Check bp weekly, let us know if consistently >140 and/or >90.  Follow-up: Return in about 2 weeks (around 04/25/2022) for [place], [place], in person.  Future Appointments  Date  Time Provider [place]  04/25/2022  8:50 AM Roma Schanz, CNM [facility]    Orders Placed This Encounter  Procedures   Glucose Tolerance, 2 Hours w/1 Hour   CBC   RPR   HIV Antibody (routine testing w rflx)   Antibody screen   Roma Schanz CNM, [facility] 04/11/2022 9:45 AM

## 2022-04-13 LAB — GLUCOSE TOLERANCE, 2 HOURS W/ 1HR
Glucose, 1 hour: 139 mg/dL (ref 65–199)
Glucose, 2 hour: 99 mg/dL (ref 65–139)
Glucose, Fasting: 74 mg/dL (ref 65–99)

## 2022-04-13 LAB — CBC
HCT: 32.4 % — ABNORMAL LOW (ref 35.0–45.0)
Hemoglobin: 11 g/dL — ABNORMAL LOW (ref 11.7–15.5)
MCH: 28.3 pg (ref 27.0–33.0)
MCHC: 34 g/dL (ref 32.0–36.0)
MCV: 83.3 fL (ref 80.0–100.0)
MPV: 10.5 fL (ref 7.5–12.5)
Platelets: 210 10*3/uL (ref 140–400)
RBC: 3.89 10*6/uL (ref 3.80–5.10)
RDW: 12.7 % (ref 11.0–15.0)
WBC: 6.5 10*3/uL (ref 3.8–10.8)

## 2022-04-13 LAB — RPR: RPR Ser Ql: NONREACTIVE

## 2022-04-13 LAB — ANTIBODY SCREEN: Antibody Screen: NOT DETECTED

## 2022-04-13 LAB — HIV ANTIBODY (ROUTINE TESTING W REFLEX): HIV 1&2 Ab, 4th Generation: NONREACTIVE

## 2022-04-25 ENCOUNTER — Ambulatory Visit (INDEPENDENT_AMBULATORY_CARE_PROVIDER_SITE_OTHER): Payer: Medicaid Other | Admitting: Women's Health

## 2022-04-25 ENCOUNTER — Other Ambulatory Visit (HOSPITAL_COMMUNITY)
Admission: RE | Admit: 2022-04-25 | Discharge: 2022-04-25 | Disposition: A | Discharge status: Home / Self Care | Payer: Medicaid Other | Source: Ambulatory Visit | Attending: Women's Health | Admitting: Women's Health

## 2022-04-25 ENCOUNTER — Encounter: Payer: Self-pay | Admitting: Women's Health

## 2022-04-25 VITALS — BP 115/74 | HR 88 | Wt 235.0 lb

## 2022-04-25 DIAGNOSIS — Z3483 Encounter for supervision of other normal pregnancy, third trimester: Secondary | ICD-10-CM | POA: Insufficient documentation

## 2022-04-25 DIAGNOSIS — N898 Other specified noninflammatory disorders of vagina: Secondary | ICD-10-CM | POA: Insufficient documentation

## 2022-04-25 DIAGNOSIS — O26893 Other specified pregnancy related conditions, third trimester: Secondary | ICD-10-CM | POA: Insufficient documentation

## 2022-04-25 DIAGNOSIS — Z98891 History of uterine scar from previous surgery: Secondary | ICD-10-CM

## 2022-04-25 DIAGNOSIS — Z3A30 30 weeks gestation of pregnancy: Secondary | ICD-10-CM

## 2022-04-25 NOTE — Progress Notes (Signed)
LOW-RISK PREGNANCY VISIT Patient name: Yvonne Stone MRN [id number]  Date of birth: 1991-03-21 Chief Complaint:   Routine Prenatal Visit (Complains that BV is not getting any better. Metrogel is not helping. )  History of Present Illness:   Yvonne Stone is a 31 y.o. [phone] female at [redacted]w[redacted]d with an Estimated Date of Delivery: 07/04/22 being seen today for ongoing management of a low-risk pregnancy.   Today she reports  doesn't think metrogel 2x/wk is helping w/ recurrent BV. Will feel like she has yeast, will use otc yeast cream, then BV returns. Reports today she has vaginal d/c, irritation and odor. Last CV swab 8/29 +BV, yeast. . Contractions: Not present. Vag. Bleeding: None.  Movement: Present. denies leaking of fluid.     12/27/2021    2:46 PM 12/16/2016   11:50 AM 12/16/2016   11:49 AM 04/11/2016    4:34 PM  Depression screen PHQ 2/9  Decreased Interest 2 1 1 3   Down, Depressed, Hopeless 2 1 1 3   PHQ - 2 Score 4 2 2 6   Altered sleeping 2 2  2   Tired, decreased energy 2 2  2   Change in appetite 2 2  3   Feeling bad or failure about yourself  2 1  3   Trouble concentrating 2 1  2   Moving slowly or fidgety/restless 0 1  2  Suicidal thoughts 0 0  2  PHQ-9 Score 14 11  22         12/27/2021    2:46 PM  GAD 7 : Generalized Anxiety Score  Nervous, Anxious, on Edge 2  Control/stop worrying 2  Worry too much - different things 2  Trouble relaxing 2  Restless 0  Easily annoyed or irritable 2  Afraid - awful might happen 2  Total GAD 7 Score 12      Review of Systems:   Pertinent items are noted in HPI Denies abnormal vaginal discharge w/ itching/odor/irritation, headaches, visual changes, shortness of breath, chest pain, abdominal pain, severe nausea/vomiting, or problems with urination or bowel movements unless otherwise stated above. Pertinent History Reviewed:  Reviewed past medical,surgical, social, obstetrical and family history.  Reviewed problem list,  medications and allergies. Physical Assessment:   Vitals:   04/25/22 0858  BP: 115/74  Pulse: 88  Weight: 235 lb (106.6 kg)  Body mass index is 39.11 kg/m.        Physical Examination:   General appearance: Well appearing, and in no distress  Mental status: Alert, oriented to person, place, and time  Skin: Warm & dry  Cardiovascular: Normal heart rate noted  Respiratory: Normal respiratory effort, no distress  Abdomen: Soft, gravid, nontender  Pelvic:  self-collected CV swab          Extremities: Edema: None  Fetal Status: Fetal Heart Rate (bpm): 140 Fundal Height: 32 cm Movement: Present    Chaperone: N/A   No results found for this or any previous visit (from the past 24 hour(s)).  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  Labs/procedures today:  CV swab  Plan:  Continue routine obstetrical care  Next visit: prefers in person    Reviewed: Preterm labor symptoms and general obstetric precautions including but not limited to vaginal bleeding, contractions, leaking of fluid and fetal movement were reviewed in detail with the patient.  All questions were answered. Does have home bp cuff. Office bp cuff given: not applicable. Check bp weekly, let us know if consistently >140 and/or >90.  Follow-up: Return in about 2 weeks (around 05/09/2022) for LROB, CNM, in person.  No future appointments.  No orders of the defined types were placed in this encounter.  Cheral Marker CNM, [facility] 04/25/2022 9:39 AM

## 2022-04-25 NOTE — Patient Instructions (Signed)
Yvonne Stone, thank you for choosing our office today! We appreciate the opportunity to meet your healthcare needs. You may receive a short survey by mail, e-mail, or through [employer]. If you are happy with your care we would appreciate if you could take just a few minutes to complete the survey questions. We read all of your comments and take your feedback very seriously. Thank you again for choosing our office.  Center for [employer] Team at [facility]  [facility] at [facility] ([address] [place], [place] [address]) Entrance C, located off of [address] Free 24/7 valet parking   CLASSES: Go to [email or web address] to register for classes (childbirth, breastfeeding, waterbirth, infant CPR, daddy bootcamp, etc.)  Call the office [phone]) or go to [facility] if: You begin to have strong, frequent contractions Your water breaks.  Sometimes it is a big gush of fluid, sometimes it is just a trickle that keeps getting your panties wet or running down your legs You have vaginal bleeding.  It is normal to have a small amount of spotting if your cervix was checked.  You don't feel your baby moving like normal.  If you don't, get you something to eat and drink and lay down and focus on feeling your baby move.   If your baby is still not moving like normal, you should call the office or go to [facility]  Call the office [phone]) or go to [facility] hospital for these signs of pre-eclampsia: Severe headache that does not go away with Tylenol Visual changes- seeing spots, double, blurred vision Pain under your right breast or upper abdomen that does not go away with Tums or heartburn medicine Nausea and/or vomiting Severe swelling in your hands, feet, and face   Tdap Vaccine It is recommended that you get the Tdap vaccine during the third trimester of EACH pregnancy to help protect your baby from getting pertussis (whooping cough) 27-36 weeks is the BEST time to do  this so that you can pass the protection on to your baby. During pregnancy is better than after pregnancy, but if you are unable to get it during pregnancy it will be offered at the hospital.  You can get this vaccine with [place], at the health department, your family doctor, or some local pharmacies Everyone who will be around your baby should also be up-to-date on their vaccines before the baby comes. Adults (who are not pregnant) only need 1 dose of Tdap during adulthood.   [facility] Pediatricians/Family Doctors [facility]): [address] Colette Ribas, [phone]           [facility]: [address], [phone]                [facility]): [address], [phone] (call to ask if accepting patients) [facility] Department: [address], [place], [phone]    [facility] Pediatricians/Family Doctors [facility]): [phone], [phone] [facility]: [address] [place], [phone] [facility]: [address], [phone]  [facility] Doctors  [facility]): [phone] [facility]: [address], [phone]   [facility] Doctors [facility]: [address], [phone]  [facility] Family Doctors  [facility]: [phone], [phone]  Home Blood Pressure Monitoring for Patients   Your provider has recommended that you check your  blood pressure (BP) at least once a week at home. If you do not have a blood pressure cuff at home, one will be provided for you. Contact your provider if you have not received your monitor within 1 week.   Helpful Tips for Accurate Home Blood Pressure Checks  Don't smoke, exercise, or drink caffeine 30 minutes before checking your BP Use the restroom before checking your BP (a full bladder can raise your  pressure) Relax in a comfortable upright chair Feet on the ground Left arm resting comfortably on a flat surface at the level of your heart Legs uncrossed Back supported Sit quietly and don't talk Place the cuff on your bare arm Adjust snuggly, so that only two fingertips can fit between your skin and the top of the cuff Check 2 readings separated by at least one minute Keep a log of your BP readings For a visual, please reference this diagram: http://ccnc.care/bpdiagram  Provider Name: [facility]     Phone: [phone]  Zone 1: ALL CLEAR  Continue to monitor your symptoms:  BP reading is less than 140 (top number) or less than 90 (bottom number)  No right upper stomach pain No headaches or seeing spots No feeling nauseated or throwing up No swelling in face and hands  Zone 2: CAUTION Call your doctor's office for any of the following:  BP reading is greater than 140 (top number) or greater than 90 (bottom number)  Stomach pain under your ribs in the middle or right side Headaches or seeing spots Feeling nauseated or throwing up Swelling in face and hands  Zone 3: EMERGENCY  Seek immediate medical care if you have any of the following:  BP reading is greater than160 (top number) or greater than 110 (bottom number) Severe headaches not improving with Tylenol Serious difficulty catching your breath Any worsening symptoms from Zone 2   Third Trimester of Pregnancy The third trimester is from week 29 through week 42, months 7 through 9. The third trimester is a time when the fetus is growing rapidly. At the end of the ninth month, the fetus is about 20 inches in length and weighs 6-10 pounds.  BODY CHANGES Your body goes through many changes during pregnancy. The changes vary from woman to woman.  Your weight will continue to increase. You can expect to gain 25-35 pounds (11-16 kg) by the end of the pregnancy. You may begin to get stretch marks on your hips, abdomen,  and breasts. You may urinate more often because the fetus is moving lower into your pelvis and pressing on your bladder. You may develop or continue to have heartburn as a result of your pregnancy. You may develop constipation because certain hormones are causing the muscles that push waste through your intestines to slow down. You may develop hemorrhoids or swollen, bulging veins (varicose veins). You may have pelvic pain because of the weight gain and pregnancy hormones relaxing your joints between the bones in your pelvis. Backaches may result from overexertion of the muscles supporting your posture. You may have changes in your hair. These can include thickening of your hair, rapid growth, and changes in texture. Some women also have hair loss during or after pregnancy, or hair that feels dry or thin. Your hair will most likely return to normal after your baby is born. Your breasts will continue to grow and be tender. A yellow discharge may leak from your breasts called colostrum. Your belly button may stick out. You may   feel short of breath because of your expanding uterus. You may notice the fetus "dropping," or moving lower in your abdomen. You may have a bloody mucus discharge. This usually occurs a few days to a week before labor begins. Your cervix becomes thin and soft (effaced) near your due date. WHAT TO EXPECT AT YOUR PRENATAL EXAMS  You will have prenatal exams every 2 weeks until week 36. Then, you will have weekly prenatal exams. During a routine prenatal visit: You will be weighed to make sure you and the fetus are growing normally. Your blood pressure is taken. Your abdomen will be measured to track your baby's growth. The fetal heartbeat will be listened to. Any test results from the previous visit will be discussed. You may have a cervical check near your due date to see if you have effaced. At around 36 weeks, your caregiver will check your cervix. At the same time, your  caregiver will also perform a test on the secretions of the vaginal tissue. This test is to determine if a type of bacteria, Group B streptococcus, is present. Your caregiver will explain this further. Your caregiver may ask you: What your birth plan is. How you are feeling. If you are feeling the baby move. If you have had any abnormal symptoms, such as leaking fluid, bleeding, severe headaches, or abdominal cramping. If you have any questions. Other tests or screenings that may be performed during your third trimester include: Blood tests that check for low iron levels (anemia). Fetal testing to check the health, activity level, and growth of the fetus. Testing is done if you have certain medical conditions or if there are problems during the pregnancy. FALSE LABOR You may feel small, irregular contractions that eventually go away. These are called Braxton Hicks contractions, or false labor. Contractions may last for hours, days, or even weeks before true labor sets in. If contractions come at regular intervals, intensify, or become painful, it is best to be seen by your caregiver.  SIGNS OF LABOR  Menstrual-like cramps. Contractions that are 5 minutes apart or less. Contractions that start on the top of the uterus and spread down to the lower abdomen and back. A sense of increased pelvic pressure or back pain. A watery or bloody mucus discharge that comes from the vagina. If you have any of these signs before the 37th week of pregnancy, call your caregiver right away. You need to go to the hospital to get checked immediately. HOME CARE INSTRUCTIONS  Avoid all smoking, herbs, alcohol, and unprescribed drugs. These chemicals affect the formation and growth of the baby. Follow your caregiver's instructions regarding medicine use. There are medicines that are either safe or unsafe to take during pregnancy. Exercise only as directed by your caregiver. Experiencing uterine cramps is a good sign to  stop exercising. Continue to eat regular, healthy meals. Wear a good support bra for breast tenderness. Do not use hot tubs, steam rooms, or saunas. Wear your seat belt at all times when driving. Avoid raw meat, uncooked cheese, cat litter boxes, and soil used by cats. These carry germs that can cause birth defects in the baby. Take your prenatal vitamins. Try taking a stool softener (if your caregiver approves) if you develop constipation. Eat more high-fiber foods, such as fresh vegetables or fruit and whole grains. Drink plenty of fluids to keep your urine clear or pale yellow. Take warm sitz baths to soothe any pain or discomfort caused by hemorrhoids. Use hemorrhoid cream if   your caregiver approves. If you develop varicose veins, wear support hose. Elevate your feet for 15 minutes, 3-4 times a day. Limit salt in your diet. Avoid heavy lifting, wear low heal shoes, and practice good posture. Rest a lot with your legs elevated if you have leg cramps or low back pain. Visit your dentist if you have not gone during your pregnancy. Use a soft toothbrush to brush your teeth and be gentle when you floss. A sexual relationship may be continued unless your caregiver directs you otherwise. Do not travel far distances unless it is absolutely necessary and only with the approval of your caregiver. Take prenatal classes to understand, practice, and ask questions about the labor and delivery. Make a trial run to the hospital. Pack your hospital bag. Prepare the baby's nursery. Continue to go to all your prenatal visits as directed by your caregiver. SEEK MEDICAL CARE IF: You are unsure if you are in labor or if your water has broken. You have dizziness. You have mild pelvic cramps, pelvic pressure, or nagging pain in your abdominal area. You have persistent nausea, vomiting, or diarrhea. You have a bad smelling vaginal discharge. You have pain with urination. SEEK IMMEDIATE MEDICAL CARE IF:  You  have a fever. You are leaking fluid from your vagina. You have spotting or bleeding from your vagina. You have severe abdominal cramping or pain. You have rapid weight loss or gain. You have shortness of breath with chest pain. You notice sudden or extreme swelling of your face, hands, ankles, feet, or legs. You have not felt your baby move in over an hour. You have severe headaches that do not go away with medicine. You have vision changes. Document Released: 05/17/2001 Document Revised: 05/28/2013 Document Reviewed: 07/24/2012 ExitCare Patient Information 2015 [employer]. This information is not intended to replace advice given to you by your health care provider. Make sure you discuss any questions you have with your health care provider.       

## 2022-04-26 LAB — CERVICOVAGINAL ANCILLARY ONLY
Bacterial Vaginitis (gardnerella): POSITIVE — AB
Candida Glabrata: NEGATIVE
Candida Vaginitis: POSITIVE — AB
Chlamydia: NEGATIVE
Comment: NEGATIVE
Comment: NEGATIVE
Comment: NEGATIVE
Comment: NEGATIVE
Comment: NEGATIVE
Comment: NORMAL
Neisseria Gonorrhea: NEGATIVE
Trichomonas: NEGATIVE

## 2022-05-02 ENCOUNTER — Encounter: Payer: Self-pay | Admitting: Women's Health

## 2022-05-02 MED ORDER — TERCONAZOLE 0.4 % VA CREA: 1.0000 | TOPICAL_CREAM | Freq: Every day | VAGINAL | 0 refills | Status: DC

## 2022-05-02 MED ORDER — METRONIDAZOLE 500 MG PO TABS: 500.0000 mg | ORAL_TABLET | Freq: Two times a day (BID) | ORAL | 0 refills | Status: DC

## 2022-05-02 NOTE — Addendum Note (Signed)
Addended by: Shawna Clamp R on: 05/02/2022 09:30 AM   Modules accepted: Orders

## 2022-05-10 ENCOUNTER — Ambulatory Visit (INDEPENDENT_AMBULATORY_CARE_PROVIDER_SITE_OTHER): Payer: Medicaid Other | Admitting: Women's Health

## 2022-05-10 ENCOUNTER — Encounter: Payer: Self-pay | Admitting: Women's Health

## 2022-05-10 VITALS — BP 103/72 | HR 80 | Wt 237.0 lb

## 2022-05-10 DIAGNOSIS — Z3480 Encounter for supervision of other normal pregnancy, unspecified trimester: Secondary | ICD-10-CM

## 2022-05-10 DIAGNOSIS — Z3A32 32 weeks gestation of pregnancy: Secondary | ICD-10-CM

## 2022-05-10 DIAGNOSIS — Z3483 Encounter for supervision of other normal pregnancy, third trimester: Secondary | ICD-10-CM

## 2022-05-10 NOTE — Patient Instructions (Signed)
Sheryle Hail, thank you for choosing our office today! We appreciate the opportunity to meet your healthcare needs. You may receive a short survey by mail, e-mail, or through [employer]. If you are happy with your care we would appreciate if you could take just a few minutes to complete the survey questions. We read all of your comments and take your feedback very seriously. Thank you again for choosing our office.  Center for [employer] Team at [facility]  [facility] at [facility] ([address] [place], [place] [address]) Entrance C, located off of [address] Free 24/7 valet parking   CLASSES: Go to [email or web address] to register for classes (childbirth, breastfeeding, waterbirth, infant CPR, daddy bootcamp, etc.)  Call the office [phone]) or go to [facility] if: You begin to have strong, frequent contractions Your water breaks.  Sometimes it is a big gush of fluid, sometimes it is just a trickle that keeps getting your panties wet or running down your legs You have vaginal bleeding.  It is normal to have a small amount of spotting if your cervix was checked.  You don't feel your baby moving like normal.  If you don't, get you something to eat and drink and lay down and focus on feeling your baby move.   If your baby is still not moving like normal, you should call the office or go to [facility].  Call the office [phone]) or go to [facility] hospital for these signs of pre-eclampsia: Severe headache that does not go away with Tylenol Visual changes- seeing spots, double, blurred vision Pain under your right breast or upper abdomen that does not go away with Tums or heartburn medicine Nausea and/or vomiting Severe swelling in your hands, feet, and face   Tdap Vaccine It is recommended that you get the Tdap vaccine during the third trimester of EACH pregnancy to help protect your baby from getting pertussis (whooping cough) 27-36 weeks is the BEST time to do  this so that you can pass the protection on to your baby. During pregnancy is better than after pregnancy, but if you are unable to get it during pregnancy it will be offered at the hospital.  You can get this vaccine with [place], at the health department, your family doctor, or some local pharmacies Everyone who will be around your baby should also be up-to-date on their vaccines before the baby comes. Adults (who are not pregnant) only need 1 dose of Tdap during adulthood.   [facility] Pediatricians/Family Doctors [facility]): [address] Colette Ribas, [phone]           [facility]: [address], [phone]                [facility]): [address], [phone] (call to ask if accepting patients) [facility] Department: [address], [place], [phone]    [facility] Pediatricians/Family Doctors [facility]): [phone], [phone] [facility]: [address] [place], [phone] [facility]: [address], [phone]  [facility] Doctors  [facility]): [phone] [facility]: [address] [phone]   [facility] Doctors [facility]: [address], [phone]  [facility] Family Doctors  [facility]: [phone], [phone]  Home Blood Pressure Monitoring for Patients   Your provider has recommended that you check your  blood pressure (BP) at least once a week at home. If you do not have a blood pressure cuff at home, one will be provided for you. Contact your provider if you have not received your monitor within 1 week.   Helpful Tips for Accurate Home Blood Pressure Checks  Don't smoke, exercise, or drink caffeine 30 minutes before checking your BP Use the restroom before checking your BP (a full bladder can raise your  pressure) Relax in a comfortable upright chair Feet on the ground Left arm resting comfortably on a flat surface at the level of your heart Legs uncrossed Back supported Sit quietly and don't talk Place the cuff on your bare arm Adjust snuggly, so that only two fingertips can fit between your skin and the top of the cuff Check 2 readings separated by at least one minute Keep a log of your BP readings For a visual, please reference this diagram: http://ccnc.care/bpdiagram  Provider Name: [facility]     Phone: [phone]  Zone 1: ALL CLEAR  Continue to monitor your symptoms:  BP reading is less than 140 (top number) or less than 90 (bottom number)  No right upper stomach pain No headaches or seeing spots No feeling nauseated or throwing up No swelling in face and hands  Zone 2: CAUTION Call your doctor's office for any of the following:  BP reading is greater than 140 (top number) or greater than 90 (bottom number)  Stomach pain under your ribs in the middle or right side Headaches or seeing spots Feeling nauseated or throwing up Swelling in face and hands  Zone 3: EMERGENCY  Seek immediate medical care if you have any of the following:  BP reading is greater than160 (top number) or greater than 110 (bottom number) Severe headaches not improving with Tylenol Serious difficulty catching your breath Any worsening symptoms from Zone 2  Preterm Labor and Birth Information  The normal length of a pregnancy is 39-41 weeks. Preterm labor is when labor starts before 37 completed weeks of pregnancy. What are the risk factors for preterm labor? Preterm labor is more likely to occur in women who: Have certain infections during pregnancy such as a bladder infection, sexually transmitted infection, or infection inside the uterus (chorioamnionitis). Have a shorter-than-normal cervix. Have gone into preterm labor before. Have had surgery on their cervix. Are younger than age 17  or older than age 35. Are African American. Are pregnant with twins or multiple babies (multiple gestation). Take street drugs or smoke while pregnant. Do not gain enough weight while pregnant. Became pregnant shortly after having been pregnant. What are the symptoms of preterm labor? Symptoms of preterm labor include: Cramps similar to those that can happen during a menstrual period. The cramps may happen with diarrhea. Pain in the abdomen or lower back. Regular uterine contractions that may feel like tightening of the abdomen. A feeling of increased pressure in the pelvis. Increased watery or bloody mucus discharge from the vagina. Water breaking (ruptured amniotic sac). Why is it important to recognize signs of preterm labor? It is important to recognize signs of preterm labor because babies who are born prematurely may not be fully developed. This can put them at an increased risk for: Long-term (chronic) heart and lung problems. Difficulty immediately after birth with regulating body systems, including blood sugar, body temperature, heart rate, and breathing rate. Bleeding in the brain. Cerebral palsy. Learning difficulties. Death. These risks are highest for babies who are born before 34 weeks   of pregnancy. How is preterm labor treated? Treatment depends on the length of your pregnancy, your condition, and the health of your baby. It may involve: Having a stitch (suture) placed in your cervix to prevent your cervix from opening too early (cerclage). Taking or being given medicines, such as: Hormone medicines. These may be given early in pregnancy to help support the pregnancy. Medicine to stop contractions. Medicines to help mature the baby's lungs. These may be prescribed if the risk of delivery is high. Medicines to prevent your baby from developing cerebral palsy. If the labor happens before 34 weeks of pregnancy, you may need to stay in the hospital. What should I do if I  think I am in preterm labor? If you think that you are going into preterm labor, call your health care provider right away. How can I prevent preterm labor in future pregnancies? To increase your chance of having a full-term pregnancy: Do not use any tobacco products, such as cigarettes, chewing tobacco, and e-cigarettes. If you need help quitting, ask your health care provider. Do not use street drugs or medicines that have not been prescribed to you during your pregnancy. Talk with your health care provider before taking any herbal supplements, even if you have been taking them regularly. Make sure you gain a healthy amount of weight during your pregnancy. Watch for infection. If you think that you might have an infection, get it checked right away. Make sure to tell your health care provider if you have gone into preterm labor before. This information is not intended to replace advice given to you by your health care provider. Make sure you discuss any questions you have with your health care provider. Document Revised: 09/14/2018 Document Reviewed: 10/14/2015 Elsevier Patient Education  2020 [employer].   

## 2022-05-10 NOTE — Progress Notes (Signed)
LOW-RISK PREGNANCY VISIT Patient name: Yvonne Stone MRN [id number]  Date of birth: 1990/07/10 Chief Complaint:   Routine Prenatal Visit  History of Present Illness:   Yvonne Stone is a 31 y.o. [id number] female at [redacted]w[redacted]d with an Estimated Date of Delivery: 07/04/22 being seen today for ongoing management of a low-risk pregnancy.   Today she reports  finishing up metronidazole for BV, getting ready to start terconazole for yeast. Has had recurrent BV/yeast entire pregnancy, metrogel 2x/wk did not help. Some occ spotting, esp after long day at work at [employer]. Discussed light duty vs coming out, no light duty available, doesn't want to come out yet . Contractions: Not present. Vag. Bleeding: None.  Movement: Present. denies leaking of fluid.     12/27/2021    2:46 PM 12/16/2016   11:50 AM 12/16/2016   11:49 AM 04/11/2016    4:34 PM  Depression screen PHQ 2/9  Decreased Interest 2 1 1 3   Down, Depressed, Hopeless 2 1 1 3   PHQ - 2 Score 4 2 2 6   Altered sleeping 2 2  2   Tired, decreased energy 2 2  2   Change in appetite 2 2  3   Feeling bad or failure about yourself  2 1  3   Trouble concentrating 2 1  2   Moving slowly or fidgety/restless 0 1  2  Suicidal thoughts 0 0  2  PHQ-9 Score 14 11  22         12/27/2021    2:46 PM  GAD 7 : Generalized Anxiety Score  Nervous, Anxious, on Edge 2  Control/stop worrying 2  Worry too much - different things 2  Trouble relaxing 2  Restless 0  Easily annoyed or irritable 2  Afraid - awful might happen 2  Total GAD 7 Score 12      Review of Systems:   Pertinent items are noted in HPI Denies abnormal vaginal discharge w/ itching/odor/irritation, headaches, visual changes, shortness of breath, chest pain, abdominal pain, severe nausea/vomiting, or problems with urination or bowel movements unless otherwise stated above. Pertinent History Reviewed:  Reviewed past medical,surgical, social, obstetrical and family history.  Reviewed  problem list, medications and allergies. Physical Assessment:   Vitals:   05/10/22 0837  BP: 103/72  Pulse: 80  Weight: 237 lb (107.5 kg)  Body mass index is 39.44 kg/m.        Physical Examination:   General appearance: Well appearing, and in no distress  Mental status: Alert, oriented to person, place, and time  Skin: Warm & dry  Cardiovascular: Normal heart rate noted  Respiratory: Normal respiratory effort, no distress  Abdomen: Soft, gravid, nontender  Pelvic: Cervical exam deferred         Extremities:    Fetal Status: Fetal Heart Rate (bpm): 140 Fundal Height: 32 cm Movement: Present    Chaperone: N/A   No results found for this or any previous visit (from the past 24 hour(s)).  Assessment & Plan:  1) Low-risk pregnancy at [redacted]w[redacted]d with an Estimated Date of Delivery: 07/04/22   2) Recurrent BV/yeast, finishing up meds this week  3) Prev c/s x 2> for RCS w/ removal of 5cm complex Rt dermoid, schedule next visit   Meds: No orders of the defined types were placed in this encounter.  Labs/procedures today: none  Plan:  Continue routine obstetrical care  Next visit: prefers in person    Reviewed: Preterm labor symptoms and general obstetric precautions including but  not limited to vaginal bleeding, contractions, leaking of fluid and fetal movement were reviewed in detail with the patient.  All questions were answered. Does have home bp cuff. Office bp cuff given: not applicable. Check bp weekly, let us know if consistently >140 and/or >90.  Follow-up: Return in about 2 weeks (around 05/24/2022) for LROB, MD only, in person (Dr. Despina Hidden).  Future Appointments  Date Time Provider Department Center  05/24/2022 11:50 AM Lazaro Arms, MD [facility]    No orders of the defined types were placed in this encounter.  Cheral Marker CNM, [facility] 05/10/2022 8:59 AM

## 2022-05-16 ENCOUNTER — Encounter: Payer: Self-pay | Admitting: Women's Health

## 2022-05-16 ENCOUNTER — Other Ambulatory Visit: Payer: Self-pay | Admitting: Women's Health

## 2022-05-17 DIAGNOSIS — H1013 Acute atopic conjunctivitis, bilateral: Secondary | ICD-10-CM | POA: Diagnosis not present

## 2022-05-18 DIAGNOSIS — Z0289 Encounter for other administrative examinations: Secondary | ICD-10-CM

## 2022-05-19 ENCOUNTER — Encounter: Payer: Self-pay | Admitting: *Deleted

## 2022-05-24 ENCOUNTER — Ambulatory Visit (INDEPENDENT_AMBULATORY_CARE_PROVIDER_SITE_OTHER): Payer: Medicaid Other | Admitting: Obstetrics & Gynecology

## 2022-05-24 VITALS — BP 120/70 | HR 90 | Wt 240.0 lb

## 2022-05-24 DIAGNOSIS — Z98891 History of uterine scar from previous surgery: Secondary | ICD-10-CM

## 2022-05-24 DIAGNOSIS — Z3480 Encounter for supervision of other normal pregnancy, unspecified trimester: Secondary | ICD-10-CM

## 2022-05-24 NOTE — Progress Notes (Signed)
   LOW-RISK PREGNANCY VISIT Patient name: Yvonne Stone MRN [id number]  Date of birth: 08-Aug-1990 Chief Complaint:   Routine Prenatal Visit  History of Present Illness:   Yvonne Stone is a 31 y.o. [id number] female at [redacted]w[redacted]d with an Estimated Date of Delivery: 07/04/22 being seen today for ongoing management of a low-risk pregnancy.     12/27/2021    2:46 PM 12/16/2016   11:50 AM 12/16/2016   11:49 AM 04/11/2016    4:34 PM  Depression screen PHQ 2/9  Decreased Interest 2 1 1 3   Down, Depressed, Hopeless 2 1 1 3   PHQ - 2 Score 4 2 2 6   Altered sleeping 2 2  2   Tired, decreased energy 2 2  2   Change in appetite 2 2  3   Feeling bad or failure about yourself  2 1  3   Trouble concentrating 2 1  2   Moving slowly or fidgety/restless 0 1  2  Suicidal thoughts 0 0  2  PHQ-9 Score 14 11  22     Today she reports no complaints. Contractions: Irritability. Vag. Bleeding: None.  Movement: Present. denies leaking of fluid. Review of Systems:   Pertinent items are noted in HPI Denies abnormal vaginal discharge w/ itching/odor/irritation, headaches, visual changes, shortness of breath, chest pain, abdominal pain, severe nausea/vomiting, or problems with urination or bowel movements unless otherwise stated above. Pertinent History Reviewed:  Reviewed past medical,surgical, social, obstetrical and family history.  Reviewed problem list, medications and allergies. Physical Assessment:   Vitals:   05/24/22 1151  BP: 120/70  Pulse: 90  Weight: 240 lb (108.9 kg)  Body mass index is 39.94 kg/m.        Physical Examination:   General appearance: Well appearing, and in no distress  Mental status: Alert, oriented to person, place, and time  Skin: Warm & dry  Cardiovascular: Normal heart rate noted  Respiratory: Normal respiratory effort, no distress  Abdomen: Soft, gravid, nontender  Pelvic: Cervical exam deferred         Extremities:    Fetal Status:     Movement: Present    Chaperone:      No results found for this or any previous visit (from the past 24 hour(s)).  Assessment & Plan:  1) Low-risk pregnancy at [redacted]w[redacted]d with an Estimated Date of Delivery: 07/04/22   2) Previous C section x 2-->repeat 06/28/21, possible removal of right dermoid cyst,    Meds: No orders of the defined types were placed in this encounter.  Labs/procedures today:   Plan:  Continue routine obstetrical care  Next visit: prefers in person    Reviewed: Preterm labor symptoms and general obstetric precautions including but not limited to vaginal bleeding, contractions, leaking of fluid and fetal movement were reviewed in detail with the patient.  All questions were answered. Has home bp cuff. Rx faxed to . Check bp weekly, let know if >140/90.   Follow-up: Return in about 2 weeks (around 06/07/2022) for Follow up, with Dr .  No orders of the defined types were placed in this encounter.   , MD 05/24/2022 12:28 PM

## 2022-05-25 ENCOUNTER — Other Ambulatory Visit: Payer: Self-pay | Admitting: Women's Health

## 2022-05-25 MED ORDER — CLINDAMYCIN HCL 300 MG PO CAPS: 300.0000 mg | ORAL_CAPSULE | Freq: Two times a day (BID) | ORAL | 0 refills | Status: DC

## 2022-06-07 ENCOUNTER — Ambulatory Visit (INDEPENDENT_AMBULATORY_CARE_PROVIDER_SITE_OTHER): Payer: Self-pay | Admitting: Obstetrics & Gynecology

## 2022-06-07 ENCOUNTER — Encounter: Payer: Self-pay | Admitting: Obstetrics & Gynecology

## 2022-06-07 ENCOUNTER — Other Ambulatory Visit (HOSPITAL_COMMUNITY)
Admission: RE | Admit: 2022-06-07 | Discharge: 2022-06-07 | Disposition: A | Discharge status: Home / Self Care | Payer: Federal, State, Local not specified - PPO | Source: Ambulatory Visit | Attending: Obstetrics & Gynecology | Admitting: Obstetrics & Gynecology

## 2022-06-07 VITALS — BP 114/74 | HR 85 | Wt 242.5 lb

## 2022-06-07 DIAGNOSIS — Z3480 Encounter for supervision of other normal pregnancy, unspecified trimester: Secondary | ICD-10-CM

## 2022-06-07 DIAGNOSIS — Z98891 History of uterine scar from previous surgery: Secondary | ICD-10-CM

## 2022-06-07 DIAGNOSIS — Z3A36 36 weeks gestation of pregnancy: Secondary | ICD-10-CM | POA: Insufficient documentation

## 2022-06-07 NOTE — Progress Notes (Signed)
LOW-RISK PREGNANCY VISIT Patient name: Yvonne Stone MRN [id number]  Date of birth: 12/15/1990 Chief Complaint:   Routine Prenatal Visit (GBS, GC/CHL; sharp pains in rectum)  History of Present Illness:   Yvonne Stone is a 32 y.o. [phone] female at [redacted]w[redacted]d with an Estimated Date of Delivery: 07/04/22 being seen today for ongoing management of a low-risk pregnancy.     12/27/2021    2:46 PM 12/16/2016   11:50 AM 12/16/2016   11:49 AM 04/11/2016    4:34 PM  Depression screen PHQ 2/9  Decreased Interest 2 1 1 3   Down, Depressed, Hopeless 2 1 1 3   PHQ - 2 Score 4 2 2 6   Altered sleeping 2 2  2   Tired, decreased energy 2 2  2   Change in appetite 2 2  3   Feeling bad or failure about yourself  2 1  3   Trouble concentrating 2 1  2   Moving slowly or fidgety/restless 0 1  2  Suicidal thoughts 0 0  2  PHQ-9 Score 14 11  22     Today she reports no complaints. Contractions: Irritability. Vag. Bleeding: None.  Movement: Present. denies leaking of fluid. Review of Systems:   Pertinent items are noted in HPI Denies abnormal vaginal discharge w/ itching/odor/irritation, headaches, visual changes, shortness of breath, chest pain, abdominal pain, severe nausea/vomiting, or problems with urination or bowel movements unless otherwise stated above. Pertinent History Reviewed:  Reviewed past medical,surgical, social, obstetrical and family history.  Reviewed problem list, medications and allergies. Physical Assessment:   Vitals:   06/07/22 1110  BP: 114/74  Pulse: 85  Weight: 242 lb 8 oz (110 kg)  Body mass index is 40.35 kg/m.        Physical Examination:   General appearance: Well appearing, and in no distress  Mental status: Alert, oriented to person, place, and time  Skin: Warm & dry  Cardiovascular: Normal heart rate noted  Respiratory: Normal respiratory effort, no distress  Abdomen: Soft, gravid, nontender  Pelvic: Cervical exam performed LTC        Extremities:    Fetal  Status:     Movement: Present    Chaperone: Levy Pupa    No results found for this or any previous visit (from the past 24 hour(s)).  Assessment & Plan:  1) Low-risk pregnancy [id number] at [redacted]w[redacted]d with an Estimated Date of Delivery: 07/04/22     ICD-10-CM   1. Supervision of other normal pregnancy, antepartum  Z34.80 Cervicovaginal ancillary only( [place])    Strep Gp B NAA    2. History of cesarean delivery x2  Z98.891    repeat 06/28/22 [facility]    3. [redacted] weeks gestation of pregnancy  Z3A.36 Cervicovaginal ancillary only( [place])    Strep Gp B NAA        Meds: No orders of the defined types were placed in this encounter.  Labs/procedures today: cultures  Plan:  Continue routine obstetrical care  Next visit: prefers in person    Reviewed: Term labor symptoms and general obstetric precautions including but not limited to vaginal bleeding, contractions, leaking of fluid and fetal movement were reviewed in detail with the patient.  All questions were answered. Has home bp cuff. Rx faxed to . Check bp weekly, let us know if >140/90.   Follow-up: Return in about 10 days (around 06/17/2022) for [place].  Orders Placed This Encounter  Procedures   Strep Gp B NAA    Florian Buff,  MD 06/07/2022 11:30 AM

## 2022-06-08 LAB — CERVICOVAGINAL ANCILLARY ONLY
Chlamydia: NEGATIVE
Comment: NEGATIVE
Comment: NORMAL
Neisseria Gonorrhea: NEGATIVE

## 2022-06-09 ENCOUNTER — Encounter: Payer: Self-pay | Admitting: Women's Health

## 2022-06-09 LAB — STREP GP B NAA: Strep Gp B NAA: POSITIVE — AB

## 2022-06-14 ENCOUNTER — Encounter (HOSPITAL_COMMUNITY): Payer: Self-pay

## 2022-06-14 NOTE — Patient Instructions (Addendum)
Yvonne Stone  06/14/2022   Your procedure is scheduled on:  06/28/2022  Arrive at 98 at [place] on [address] at [facility]  and [employer]. You are invited to use the FREE valet parking or use the Visitor's parking deck.  Pick up the phone at the desk and dial [phone].  Call this number if you have problems the morning of surgery: [phone]  Remember:   Do not eat food:(After Midnight) Desps de medianoche.  Do not drink clear liquids: (After Midnight) Desps de medianoche.  Take these medicines the morning of surgery with A SIP OF WATER:  none   Do not wear jewelry, make-up or nail polish.  Do not wear lotions, powders, or perfumes. Do not wear deodorant.  Do not shave 48 hours prior to surgery.  Do not bring valuables to the hospital.  [facility] is not   responsible for any belongings or valuables brought to the hospital.  Contacts, dentures or bridgework may not be worn into surgery.  Leave suitcase in the car. After surgery it may be brought to your room.  For patients admitted to the hospital, checkout time is 11:00 AM the day of              discharge.      Please read over the following fact sheets that you were given:     Preparing for Surgery

## 2022-06-16 ENCOUNTER — Encounter: Payer: Self-pay | Admitting: Women's Health

## 2022-06-16 DIAGNOSIS — Z3A00 Weeks of gestation of pregnancy not specified: Secondary | ICD-10-CM | POA: Diagnosis not present

## 2022-06-16 DIAGNOSIS — Z3A37 37 weeks gestation of pregnancy: Secondary | ICD-10-CM | POA: Diagnosis not present

## 2022-06-16 DIAGNOSIS — O34219 Maternal care for unspecified type scar from previous cesarean delivery: Secondary | ICD-10-CM | POA: Diagnosis not present

## 2022-06-16 DIAGNOSIS — O34211 Maternal care for low transverse scar from previous cesarean delivery: Secondary | ICD-10-CM | POA: Diagnosis not present

## 2022-06-16 DIAGNOSIS — O99824 Streptococcus B carrier state complicating childbirth: Secondary | ICD-10-CM | POA: Diagnosis not present

## 2022-06-17 ENCOUNTER — Encounter: Payer: Medicaid Other | Admitting: Advanced Practice Midwife

## 2022-06-18 DIAGNOSIS — Z4889 Encounter for other specified surgical aftercare: Secondary | ICD-10-CM | POA: Diagnosis not present

## 2022-06-22 ENCOUNTER — Ambulatory Visit (INDEPENDENT_AMBULATORY_CARE_PROVIDER_SITE_OTHER): Payer: Medicaid Other | Admitting: Family Medicine

## 2022-06-22 ENCOUNTER — Encounter: Payer: Self-pay | Admitting: *Deleted

## 2022-06-22 NOTE — Progress Notes (Signed)
    SUBJECTIVE:   CHIEF COMPLAINT / HPI:   Post op incision check Patient presents today for evaluation of postop incision check.  She reports that she went into labor and went to the nearest hospital which was [facility].  She was in labor and due to her requesting repeat cesarean section she had cesarean section completed there.  Reports that she has been doing well postop and has no major concerns.  Is breast-feeding.  Is on POPS for birth control.  Reports her pain is well-managed with ibuprofen.  Denies any drainage, erythema, warmth around the incision site.  PERTINENT  PMH / PSH: Post CS on 06/16/2022  OBJECTIVE:   BP 116/78 (BP Location: Right Arm, Patient Position: Sitting, Cuff Size: Normal)   Pulse 70   Wt 236 lb 12.8 oz (107.4 kg)   Breastfeeding Yes   BMI 39.41 kg/m   General: Well-appearing 32 year old female in no acute distress Cardiac: Regular rate Respiratory: Normal work of breathing, speaking in full sentences Abdomen: Soft, nontender, Pfannenstiel incision in the lower abdomen which is healing well, no erythema, drainage, warmth appreciated  ASSESSMENT/PLAN:   Postpartum care following cesarean delivery Skin incision healing well.  No signs of infection.  Discussed continued pain management with over-the-counter medications.  Discussed signs and symptoms of infection such as drainage, warmth around the incision site, increased pain, fevers.  Patient will be reevaluated if noticing any concerning signs.  No further questions or concerns.  Follow-up in 3-5 weeks for postpartum visit.     Concepcion Living, MD [place]

## 2022-06-22 NOTE — Assessment & Plan Note (Signed)
Skin incision healing well.  No signs of infection.  Discussed continued pain management with over-the-counter medications.  Discussed signs and symptoms of infection such as drainage, warmth around the incision site, increased pain, fevers.  Patient will be reevaluated if noticing any concerning signs.  No further questions or concerns.  Follow-up in 3-5 weeks for postpartum visit.

## 2022-06-24 ENCOUNTER — Encounter: Payer: Medicaid Other | Admitting: Obstetrics & Gynecology

## 2022-06-27 ENCOUNTER — Other Ambulatory Visit: Payer: Self-pay | Admitting: Obstetrics & Gynecology

## 2022-06-27 ENCOUNTER — Encounter (HOSPITAL_COMMUNITY)
Admission: RE | Admit: 2022-06-27 | Discharge: 2022-06-27 | Disposition: A | Discharge status: Home / Self Care | Payer: Federal, State, Local not specified - PPO | Source: Ambulatory Visit | Attending: Family Medicine | Admitting: Family Medicine

## 2022-06-27 DIAGNOSIS — Z01818 Encounter for other preprocedural examination: Secondary | ICD-10-CM

## 2022-06-27 HISTORY — DX: Family history of other specified conditions: Z84.89

## 2022-06-28 ENCOUNTER — Inpatient Hospital Stay (HOSPITAL_COMMUNITY)
Admission: RE | Admit: 2022-06-28 | Payer: Federal, State, Local not specified - PPO | Source: Home / Self Care | Admitting: Obstetrics & Gynecology

## 2022-06-28 SURGERY — Surgical Case
Anesthesia: Regional

## 2022-07-04 ENCOUNTER — Other Ambulatory Visit (HOSPITAL_COMMUNITY)
Admission: RE | Admit: 2022-07-04 | Discharge: 2022-07-04 | Disposition: A | Discharge status: Home / Self Care | Payer: Federal, State, Local not specified - PPO | Source: Ambulatory Visit | Attending: Obstetrics and Gynecology | Admitting: Obstetrics and Gynecology

## 2022-07-04 ENCOUNTER — Encounter: Payer: Self-pay | Admitting: Obstetrics and Gynecology

## 2022-07-04 ENCOUNTER — Ambulatory Visit (INDEPENDENT_AMBULATORY_CARE_PROVIDER_SITE_OTHER): Payer: Federal, State, Local not specified - PPO | Admitting: Obstetrics and Gynecology

## 2022-07-04 VITALS — BP 119/68 | HR 56 | Wt 225.0 lb

## 2022-07-04 DIAGNOSIS — N898 Other specified noninflammatory disorders of vagina: Secondary | ICD-10-CM | POA: Diagnosis not present

## 2022-07-04 NOTE — Progress Notes (Signed)
Yvonne Stone presents with complaint of malodorous vaginal discharge for the last several weeks S/P c section on 06/16/22 Occ vaginal spotting Denies any bowel or bladder dysfunction Has not been sexual active since delivery Breast/Bottle feeding  PE AF VSS Chaperone present  Lungs clear  Heart RRR Abd soft + BS incision healing well GU Nl EGBUS scant discharge noted, cervix with no lesions, uterus non tender, no masses  A/P Vaginal discharge Vaginal swab collected Tx as per test results

## 2022-07-05 ENCOUNTER — Telehealth: Payer: Self-pay | Admitting: *Deleted

## 2022-07-05 NOTE — Telephone Encounter (Signed)
Anderson Malta, [facility] postpartum nurse saw pt yesterday. Pt scored 11 out of 30 on edinburgh scale. Not on any meds currently . Has a history of pp depression. Has a good family support. + breastfeeding. I called pt. Pt states she is "fine". She don't feel like she needs any meds at this time but if anything changes, she will reach out. Pt has pp visit scheduled for 07/20/22. Advised to keep that appt. Pt voiced understanding. [place]

## 2022-07-06 ENCOUNTER — Other Ambulatory Visit: Payer: Self-pay | Admitting: *Deleted

## 2022-07-06 LAB — CERVICOVAGINAL ANCILLARY ONLY
Bacterial Vaginitis (gardnerella): POSITIVE — AB
Candida Glabrata: NEGATIVE
Candida Vaginitis: NEGATIVE
Chlamydia: NEGATIVE
Comment: NEGATIVE
Comment: NEGATIVE
Comment: NEGATIVE
Comment: NEGATIVE
Comment: NEGATIVE
Comment: NORMAL
Neisseria Gonorrhea: NEGATIVE
Trichomonas: NEGATIVE

## 2022-07-06 MED ORDER — METRONIDAZOLE 500 MG PO TABS: 500.0000 mg | ORAL_TABLET | Freq: Two times a day (BID) | ORAL | 0 refills | Status: DC

## 2022-07-20 ENCOUNTER — Encounter: Payer: Self-pay | Admitting: Advanced Practice Midwife

## 2022-07-20 ENCOUNTER — Ambulatory Visit (INDEPENDENT_AMBULATORY_CARE_PROVIDER_SITE_OTHER): Payer: Federal, State, Local not specified - PPO | Admitting: Advanced Practice Midwife

## 2022-07-20 DIAGNOSIS — Z98891 History of uterine scar from previous surgery: Secondary | ICD-10-CM

## 2022-07-20 DIAGNOSIS — Z8759 Personal history of other complications of pregnancy, childbirth and the puerperium: Secondary | ICD-10-CM

## 2022-07-20 DIAGNOSIS — Z8659 Personal history of other mental and behavioral disorders: Secondary | ICD-10-CM

## 2022-07-20 DIAGNOSIS — F53 Postpartum depression: Secondary | ICD-10-CM

## 2022-07-20 DIAGNOSIS — N838 Other noninflammatory disorders of ovary, fallopian tube and broad ligament: Secondary | ICD-10-CM

## 2022-07-20 MED ORDER — NORGESTIMATE-ETH ESTRADIOL 0.25-35 MG-MCG PO TABS: 1.0000 | ORAL_TABLET | Freq: Every day | ORAL | 12 refills | Status: DC

## 2022-07-20 NOTE — Patient Instructions (Signed)
Use the website www.[email or web address].net for postpartum depression/anxiety resources!

## 2022-07-20 NOTE — Progress Notes (Addendum)
POSTPARTUM VISIT Patient name: Yvonne Stone MRN [id number]  Date of birth: 12-Dec-1990 Chief Complaint:   Postpartum Care  History of Present Illness:   Yvonne Stone is a 32 y.o. [phone] African American female being seen today for a postpartum visit. She is 5 weeks postpartum following a repeat cesarean section, low transverse incision at 37.3 gestational weeks. IOL: no, for n/a. Anesthesia: spinal.  Laceration: n/a.  Complications: none. Inpatient contraception: no.   Pregnancy uncomplicated. Tobacco use: no. Substance use disorder: no. Last pap smear: July 2023 and results were ASCUS w/ HRHPV negative. Next pap smear due: July 2026 No LMP recorded.  Postpartum course has been uncomplicated. Bleeding none. Bowel function is normal. Bladder function is normal. Urinary incontinence? no, fecal incontinence? no Patient is not sexually active. Last sexual activity: prior to birth of baby. Desired contraception: OCPs. Patient does not know about a pregnancy in the future.  Desired family size is unsure number of children.   Upstream - 07/20/22 0837       Pregnancy Intention Screening   Does the patient want to become pregnant in the next year? No    Does the patient's partner want to become pregnant in the next year? No    Would the patient like to discuss contraceptive options today? No      Contraception Wrap Up   Current Method Abstinence    End Method Abstinence;Oral Contraceptive    Contraception Counseling Provided No            The pregnancy intention screening data noted above was reviewed. Potential methods of contraception were discussed. The patient elected to proceed with Abstinence; Oral Contraceptive.  Edinburgh Postpartum Depression Screening: positive: H/O mental health disorder: yes hx PPD and general depression. Currently on meds: no.  Currently in therapy: no.  Sleeping: as expected w newborn.  Appetite: not as hungry.  Still finds joy in things she used to:  Yes.  Support at home: minimal- only FOB and he works.  SI/HI/II: no.  Interested in medicine: No.  Interested in therapy: Yes.  Edinburgh Postnatal Depression Scale - 07/20/22 0837       Edinburgh Postnatal Depression Scale:  In the Past 7 Days   I have been able to laugh and see the funny side of things. 1    I have looked forward with enjoyment to things. 1    I have blamed myself unnecessarily when things went wrong. 3    I have been anxious or worried for no good reason. 2    I have felt scared or panicky for no good reason. 2    Things have been getting on top of me. 0    I have been so unhappy that I have had difficulty sleeping. 0    I have felt sad or miserable. 3    I have been so unhappy that I have been crying. 3    The thought of harming myself has occurred to me. 0    Edinburgh Postnatal Depression Scale Total 15                12/27/2021    2:46 PM  GAD 7 : Generalized Anxiety Score  Nervous, Anxious, on Edge 2  Control/stop worrying 2  Worry too much - different things 2  Trouble relaxing 2  Restless 0  Easily annoyed or irritable 2  Afraid - awful might happen 2  Total GAD 7 Score 12     Baby's  course has been uncomplicated. Baby is feeding by bottle. Infant has a pediatrician/family doctor? Yes.  Childcare strategy if returning to work/school:  unsure at this time; returning to work in April .  Pt has material needs met for her and baby: Yes.   Review of Systems:   Pertinent items are noted in HPI Denies Abnormal vaginal discharge w/ itching/odor/irritation, headaches, visual changes, shortness of breath, chest pain, abdominal pain, severe nausea/vomiting, or problems with urination or bowel movements. Pertinent History Reviewed:  Reviewed past medical,surgical, obstetrical and family history.  Reviewed problem list, medications and allergies. OB History  Gravida Para Term Preterm AB Living  4 3 3   1 3  $ SAB IAB Ectopic Multiple Live Births  1     0 3     # Outcome Date GA Lbr Len/2nd Weight Sex Delivery Anes PTL Lv  4 Term 06/16/22 [redacted]w[redacted]d 6 lb (2.722 kg) M CS-LTranv Spinal N LIV  3 Term 07/15/16 [id number] lb 2.8 oz (3.255 kg) M CS-LTranv Spinal  LIV  2 Term 02/22/14 [id number] lb 10 oz (3.005 kg) M CS-LTranv  N LIV     Complications: Fetal Intolerance  1 SAB            Physical Assessment:   Vitals:   07/20/22 0839  BP: 107/70  Pulse: 65  Weight: 220 lb (99.8 kg)  Height: 5' 5"$  (1.651 m)  Body mass index is 36.61 kg/m.       Physical Examination:   General appearance: alert, well appearing, and in no distress  Mental status: alert, oriented to person, place, and time  Skin: warm & dry   Cardiovascular: normal heart rate noted   Respiratory: normal respiratory effort, no distress   Breasts: deferred, no complaints   Abdomen: soft, non-tender; rLTCS incision well-healed  Pelvic: examination not indicated. Thin prep pap obtained: No  Rectal: not examined  Extremities: Edema: none        No results found for this or any previous visit (from the past 24 hour(s)).  Assessment & Plan:  1) Postpartum exam 2) Five wks s/p repeat cesarean section, [place] transverse [place] [facility] 3) bottle feeding 4) Depression screening: positive; declines meds [email or web address] given 5) Contraception management: rx OrthoCyclen to start now with backup x 1 month 6) R ovarian cyst, wasn't removed during C/S; will get pelvic u/s in 1-2 months w MD f/u  Essential components of care per ACOG recommendations:  1.  Mood and well being:  If positive depression screen, discussed and plan developed.  If using tobacco we discussed reduction/cessation and risk of relapse If current substance abuse, we discussed and referral to local resources was offered.   2. Infant care and feeding:  If breastfeeding, discussed returning to work, pumping,  breastfeeding-associated pain, guidance regarding return to fertility while lactating if not using another method. If needed, patient was provided with a letter to be allowed to pump q 2-3hrs to support lactation in a private location with access to a refrigerator to store breastmilk.   Recommended that all caregivers be immunized for flu, pertussis and other preventable communicable diseases If pt does not have material needs met for her/baby, referred to local resources for help obtaining these.  3. Sexuality, contraception and birth spacing Provided guidance regarding sexuality, management of dyspareunia, and resumption of intercourse Discussed avoiding interpregnancy interval <6mt23mand recommended birth  spacing of 18 months  4. Sleep and fatigue Discussed coping options for fatigue and sleep disruption Encouraged family/partner/community support of 4 hrs of uninterrupted sleep to help with mood and fatigue  5. Physical recovery  If pt had a C/S, assessed incisional pain and providing guidance on normal vs prolonged recovery If pt had a laceration, perineal healing and pain reviewed.  If urinary or fecal incontinence, discussed management and referred to PT or uro/gyn if indicated  Patient is safe to resume physical activity. Discussed attainment of healthy weight.  6.  Chronic disease management Discussed pregnancy complications if any, and their implications for future childbearing and long-term maternal health. Review recommendations for prevention of recurrent pregnancy complications, such as 17 hydroxyprogesterone caproate to reduce risk for recurrent PTB not applicable, or aspirin to reduce risk of preeclampsia not applicable. Pt had GDM: no. If yes, 2hr GTT scheduled: not applicable. Reviewed medications and non-pregnant dosing including consideration of whether pt is breastfeeding using a reliable resource such as LactMed: not applicable Referred for f/u w/ PCP or subspecialist  providers as indicated: not applicable  7. Health maintenance Mammogram at 31yo or earlier if indicated Pap smears as indicated  Meds:  Meds ordered this encounter  Medications   norgestimate-ethinyl estradiol (ORTHO-CYCLEN) 0.25-35 MG-MCG tablet    Sig: Take 1 tablet by mouth daily.    Dispense:  28 tablet    Refill:  12    Order Specific Question:   Supervising Provider    Answer:   Janyth Pupa [id number]    Follow-up: Return for 1-2 months pelvic u/s to f/u on R cyst and MD visit.   Orders Placed This Encounter  Procedures   US Transvaginal Non-OB   Ambulatory referral to [place] 07/20/2022 12:57 PM

## 2022-07-26 NOTE — BH Specialist Note (Signed)
Integrated Behavioral Health via Telemedicine Visit  08/09/2022 RONISHA PINELA [id number]  Number of Integrated Behavioral Health Clinician visits: 1- Initial Visit  Session Start time: [id number]   Session End time: [id number]  Total time in minutes: 35   Referring Provider: Serita Grammes, CNM Patient/Family location: Home [facility] Provider location: Center for [place] [facility]  All persons participating in visit: Patient Scotty Stohler and [place]   Types of Service: Individual psychotherapy and Video visit  I connected with Blair Promise and/or [place]  via  Telephone or [employer]  (Video is Tree surgeon) and verified that I am speaking with the correct person using two identifiers. Discussed confidentiality: Yes   I discussed the limitations of telemedicine and the availability of in person appointments.  Discussed there is a possibility of technology failure and discussed alternative modes of communication if that failure occurs.  I discussed that engaging in this telemedicine visit, they consent to the provision of behavioral healthcare and the services will be billed under their insurance.  Patient and/or legal guardian expressed understanding and consented to Telemedicine visit: Yes   Presenting Concerns: Patient and/or family reports the following symptoms/concerns: Increased depression, anxiety and life stress, attributes to financial stress, sleep deprivation postpartum (sleeps 3.5 hours nightly), emotional stress from ex-husband/divorce finalized July 2023, custody lawyer not meeting to proceed (after pt took out a loan to pay for legal services); lack of support. Pt's goal is to "stop worrying so much" and is open to implementing self-coping strategy today. Duration of problem: Ongoing new stressors over past two years; Severity of problem: moderate  Patient and/or Family's  Strengths/Protective Factors: Concrete supports in place (healthy food, safe environments, etc.) and Sense of purpose  Goals Addressed: Patient will:  Reduce symptoms of: anxiety, depression, and stress   Increase knowledge and/or ability of: self-management skills and stress reduction   Demonstrate ability to: Increase healthy adjustment to current life circumstances  Progress towards Goals: Ongoing  Interventions: Interventions utilized:  Solution-Focused Strategies, Psychoeducation and/or Health Education, and Link to [employer] Standardized Assessments completed: GAD-7 and PHQ 9  Patient and/or Family Response: Patient agrees with treatment plan.   Assessment: Patient currently experiencing Adjustment disorder with mixed anxious and depressed mood and Psychosocial stress.   Patient may benefit from psychoeducation and brief therapeutic interventions regarding coping with symptoms of anxiety, depression, life stress .  Plan: Follow up with behavioral health clinician on : Two weeks Behavioral recommendations:  -Continue prioritizing healthy self-care (regular meals, adequate rest; allowing practical help from supportive friends and family) until at least postpartum medical appointment -Consider new mom support group as needed at either www.postpartum.net or www.conehealthybaby.com  -Begin Worry Time strategy, as discussed. Start by setting up start and end time reminders on phone today; continue daily for two weeks. -Read through After Visit Summary information; use as needed Referral(s): Integrated Orthoptist (In Clinic) and [employer] Resources:  Childcare; Scientist, research (physical sciences); New mom support  I discussed the assessment and treatment plan with the patient and/or parent/guardian. They were provided an opportunity to ask questions and all were answered. They agreed with the plan and demonstrated an understanding of the instructions.   They were advised to call back  or seek an in-person evaluation if the symptoms worsen or if the condition fails to improve as anticipated.  Garlan Fair, LCSW     08/09/2022    2:31 PM 12/27/2021  2:46 PM 12/16/2016   11:50 AM 12/16/2016   11:49 AM 04/11/2016    4:34 PM  Depression screen PHQ 2/9  Decreased Interest '2 2 1 1 3  '$ Down, Depressed, Hopeless '1 2 1 1 3  '$ PHQ - 2 Score '3 4 2 2 6  '$ Altered sleeping '3 2 2  2  '$ Tired, decreased energy '3 2 2  2  '$ Change in appetite 0 '2 2  3  '$ Feeling bad or failure about yourself  '1 2 1  3  '$ Trouble concentrating 0 '2 1  2  '$ Moving slowly or fidgety/restless 0 0 1  2  Suicidal thoughts 0 0 0  2  PHQ-9 Score '10 14 11  22      '$ 08/09/2022    2:33 PM 12/27/2021    2:46 PM  GAD 7 : Generalized Anxiety Score  Nervous, Anxious, on Edge 1 2  Control/stop worrying 3 2  Worry too much - different things 2 2  Trouble relaxing 3 2  Restless 0 0  Easily annoyed or irritable 3 2  Afraid - awful might happen 1 2  Total GAD 7 Score 13 12

## 2022-08-01 ENCOUNTER — Other Ambulatory Visit: Payer: Self-pay | Admitting: Adult Health

## 2022-08-01 MED ORDER — METRONIDAZOLE 0.75 % VA GEL: VAGINAL | 1 refills | Status: DC

## 2022-08-01 NOTE — Progress Notes (Signed)
Rx metrogel

## 2022-08-09 ENCOUNTER — Ambulatory Visit (INDEPENDENT_AMBULATORY_CARE_PROVIDER_SITE_OTHER): Payer: Medicaid Other | Admitting: Clinical

## 2022-08-09 DIAGNOSIS — Z658 Other specified problems related to psychosocial circumstances: Secondary | ICD-10-CM

## 2022-08-09 DIAGNOSIS — F4323 Adjustment disorder with mixed anxiety and depressed mood: Secondary | ICD-10-CM

## 2022-08-09 NOTE — Patient Instructions (Signed)
Center for [facility] at [facility] [place], [place] [address] [phone] (main office) [phone] [facility] office)  New Parent Support Groups www.postpartum.net www.conehealthybaby.com  [employer] [place] TV[employer]reos.ch 386-213-9669  [facility] www.womenscentergso.org  [place] [employer]  (Childcare options, Early childcare development, etc.) PopTick.no  [place]  [email or web address]

## 2022-08-12 NOTE — BH Specialist Note (Signed)
Pt did not arrive to video visit and did not answer the phone; Left HIPPA-compliant message to call back Eleana Tocco from [facility] at  [phone] ([place]'s office).  ?; left MyChart message for patient.  ? ?

## 2022-08-26 ENCOUNTER — Ambulatory Visit: Payer: Medicaid Other | Admitting: Clinical

## 2022-08-26 DIAGNOSIS — Z91199 Patient's noncompliance with other medical treatment and regimen due to unspecified reason: Secondary | ICD-10-CM

## 2022-09-15 ENCOUNTER — Other Ambulatory Visit: Payer: Self-pay | Admitting: Advanced Practice Midwife

## 2022-09-15 ENCOUNTER — Encounter: Payer: Self-pay | Admitting: Women's Health

## 2022-09-15 MED ORDER — FLUCONAZOLE 150 MG PO TABS: ORAL_TABLET | ORAL | 2 refills | Status: DC

## 2022-09-20 ENCOUNTER — Other Ambulatory Visit: Payer: Federal, State, Local not specified - PPO

## 2022-09-20 ENCOUNTER — Ambulatory Visit: Payer: Federal, State, Local not specified - PPO | Admitting: Obstetrics & Gynecology

## 2022-09-21 ENCOUNTER — Other Ambulatory Visit (HOSPITAL_COMMUNITY)
Admission: RE | Admit: 2022-09-21 | Discharge: 2022-09-21 | Disposition: A | Discharge status: Home / Self Care | Payer: Medicaid Other | Source: Ambulatory Visit | Attending: Obstetrics & Gynecology | Admitting: Obstetrics & Gynecology

## 2022-09-21 ENCOUNTER — Encounter: Payer: Self-pay | Admitting: Obstetrics & Gynecology

## 2022-09-21 ENCOUNTER — Ambulatory Visit: Payer: Medicaid Other | Admitting: Obstetrics & Gynecology

## 2022-09-21 ENCOUNTER — Ambulatory Visit (INDEPENDENT_AMBULATORY_CARE_PROVIDER_SITE_OTHER): Payer: Medicaid Other

## 2022-09-21 VITALS — BP 102/69 | HR 63 | Wt 224.6 lb

## 2022-09-21 DIAGNOSIS — N838 Other noninflammatory disorders of ovary, fallopian tube and broad ligament: Secondary | ICD-10-CM | POA: Diagnosis not present

## 2022-09-21 DIAGNOSIS — N83201 Unspecified ovarian cyst, right side: Secondary | ICD-10-CM

## 2022-09-21 DIAGNOSIS — N898 Other specified noninflammatory disorders of vagina: Secondary | ICD-10-CM | POA: Insufficient documentation

## 2022-09-21 DIAGNOSIS — Z3009 Encounter for other general counseling and advice on contraception: Secondary | ICD-10-CM

## 2022-09-21 NOTE — Progress Notes (Signed)
GYN VISIT Patient name: Yvonne Stone MRN [id number]  Date of birth: December 09, 1990 Chief Complaint:   Follow-up (Ovarian cyst)  History of Present Illness:   Yvonne Stone is a 32 y.o. [phone] female being seen today for follow-up regarding the following:  Ovarian cyst: During her pregnancy a right ovarian cyst was noted.  She presents today for follow-up ultrasound.  Currently her menses are irregular but it is not a problem for her.  She denies HMB or dysmenorrhea.  She denies pelvic or abdominal pain.  Denies nausea or vomiting.  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  She has been treated with MetroGel but does not feel like anything has changed.  Patient does desire a pregnancy in the future.  Mother present to review all information  No LMP recorded. (Menstrual status: Oral contraceptives).     08/09/2022    2:31 PM 12/27/2021    2:46 PM 12/16/2016   11:50 AM 12/16/2016   11:49 AM 04/11/2016    4:34 PM  Depression screen PHQ 2/9  Decreased Interest Down, Depressed, Hopeless PHQ - 2 Score Altered sleeping Tired, decreased energy Change in appetite 0 Feeling bad or failure about yourself  Trouble concentrating 0 Moving slowly or fidgety/restless 0 0 1  2  Suicidal thoughts 0 0 0  2  PHQ-9 Score Review of Systems:   Pertinent items are noted in HPI Denies fever/chills, dizziness, headaches, visual disturbances, fatigue, shortness of breath, chest pain, abdominal pain, vomiting, no problems with periods, bowel movements, urination, or intercourse unless otherwise stated above.  Pertinent History Reviewed:  Reviewed past medical,surgical, social, obstetrical and family history.  Reviewed problem list, medications and allergies. Physical Assessment:   Vitals:   09/21/22 1155  BP: 102/69  Pulse: 63  Weight: 224 lb  9.6 oz (101.9 kg)  Body mass index is 37.38 kg/m.       Physical Examination:   General appearance: alert, well appearing, and in no distress  Psych: mood appropriate, normal affect  Skin: warm & dry   Cardiovascular: normal heart rate noted  Respiratory: normal respiratory effort, no distress  Extremities: no edema   Chaperone: N/A     PELVIC US TA/TV: homogeneous anteverted uterus,WNL, EEC 14.2 mm,normal left ovary,enlarged heterogeneous right ovary with three echogenic masses (dermoids)(#1) 3.5 x 2.6 x 3.2 cm,(#2) 1.9 x 2.2 x 1.7 cm,(#3) 1.4 x 1.2 x 1.1 cm,ovaries appear mobile,no free fluid,no pain during ultrasound      Assessment & Plan:  1) Dermoid cyst -Reviewed ultrasound findings -While dermoid cyst is not a malignancy, it has increased in size compared to prior ultrasound -Discussed conservative versus surgical intervention.  Also discussed cystectomy versus oophorectomy -Reviewed risk and benefits of surgery including but not limited to risk of bleeding, infection, injury to other organs.  Discussed preservation of left ovary and ability of future pregnancy -Questions and concerns were addressed -Patient just returned to work after her last delivery and desires to postpone surgery until mid to late June -Reviewed recovery from surgery -Referral created   June 18 potential date for robotic oophorectomy  2)  Recurrent vaginitis -Reviewed conservative options -Vaginitis panel collected to confirm patient's suspicion of recurrent BV  3) Family planning -May consider future pregnancy, not interested in sterilization - Plan to continue with pills    Myna Hidalgo, DO Attending Obstetrician & Gynecologist, Faculty Practice Center for [employer], [facility]

## 2022-09-21 NOTE — Progress Notes (Signed)
PELVIC US TA/TV: homogeneous anteverted uterus,WNL, EEC 14.2 mm,normal left ovary,enlarged heterogeneous right ovary with three echogenic masses (dermoids)(#1) 3.5 x 2.6 x 3.2 cm,(#2) 1.9 x 2.2 x 1.7 cm,(#3) 1.4 x 1.2 x 1.1 cm,ovaries appear mobile,no free fluid,no pain during ultrasound  Chaperone [employer] [facility]

## 2022-09-22 ENCOUNTER — Encounter: Payer: Self-pay | Admitting: Obstetrics & Gynecology

## 2022-09-22 LAB — CERVICOVAGINAL ANCILLARY ONLY
Bacterial Vaginitis (gardnerella): POSITIVE — AB
Candida Glabrata: NEGATIVE
Candida Vaginitis: NEGATIVE
Comment: NEGATIVE
Comment: NEGATIVE
Comment: NEGATIVE

## 2022-09-23 ENCOUNTER — Other Ambulatory Visit: Payer: Self-pay | Admitting: Obstetrics & Gynecology

## 2022-09-23 DIAGNOSIS — N760 Acute vaginitis: Secondary | ICD-10-CM

## 2022-09-23 MED ORDER — FLUCONAZOLE 150 MG PO TABS
ORAL_TABLET | ORAL | 2 refills | Status: DC
Start: 2022-09-23 — End: 2022-10-12

## 2022-09-23 MED ORDER — METRONIDAZOLE 500 MG PO TABS
500.0000 mg | ORAL_TABLET | Freq: Two times a day (BID) | ORAL | 0 refills | Status: AC
Start: 2022-09-23 — End: 2022-09-30

## 2022-09-23 NOTE — Progress Notes (Signed)
-  Spoke with patient about recent vaginitis panel -Positive BV -Currently using MetroGel twice per week and after intercourse -Plan to treat with oral metronidazole twice daily x 7 days then return to MetroGel weekly for 2 to 3 months -Questions and concerns were addressed  Myna Hidalgo, DO Attending Obstetrician & Gynecologist, Faculty Practice Center for [employer], [facility]

## 2022-09-29 ENCOUNTER — Encounter: Payer: Self-pay | Admitting: Women's Health

## 2022-10-04 ENCOUNTER — Encounter: Payer: Self-pay | Admitting: Adult Health

## 2022-10-04 ENCOUNTER — Ambulatory Visit (INDEPENDENT_AMBULATORY_CARE_PROVIDER_SITE_OTHER): Payer: Medicaid Other | Admitting: Adult Health

## 2022-10-04 VITALS — BP 104/68 | HR 80 | Ht 65.0 in | Wt 223.0 lb

## 2022-10-04 DIAGNOSIS — N939 Abnormal uterine and vaginal bleeding, unspecified: Secondary | ICD-10-CM

## 2022-10-04 DIAGNOSIS — Z3202 Encounter for pregnancy test, result negative: Secondary | ICD-10-CM

## 2022-10-04 LAB — POCT URINE PREGNANCY: Preg Test, Ur: NEGATIVE

## 2022-10-04 LAB — POCT HEMOGLOBIN: Hemoglobin: 12.4 g/dL (ref 11–14.6)

## 2022-10-04 MED ORDER — MEGESTROL ACETATE 40 MG PO TABS: ORAL_TABLET | ORAL | 0 refills | Status: DC

## 2022-10-04 NOTE — Progress Notes (Signed)
  Subjective:     Patient ID: Yvonne Stone, female   DOB: [birth year], 32 y.o.   MRN: [id number]  HPI Yvonne Stone is a 32 year old black female, single, [id number], in complaining of bleeding for 16 days now, having to change pads every few hours. She has know dermoid and had is planning on surgery in June. US showed normal uterus 09/21/22. She is on ortho cyclen and has not missed any pills.  Last pas was ASCUS negative HPV 12/27/21.  PCP is Dr Neita Carp.  Review of Systems Bleeding for 16 days  Reviewed past medical,surgical, social and family history. Reviewed medications and allergies.     Objective:   Physical Exam BP 104/68 (BP Location: Left Arm, Patient Position: Sitting, Cuff Size: Large)   Pulse 80   Ht 5\' 5"  (1.651 m)   Wt 223 lb (101.2 kg)   LMP 09/19/2022   Breastfeeding No Comment: still bleeding heavy every few hours  BMI 37.11 kg/m  UPT is negative. POC HGB is 12.4 Skin warm and dry.Lungs: clear to ausculation bilaterally. Cardiovascular: regular rate and rhythm.    She declines exam  Fall risk is low  Upstream - 10/04/22 1633       Pregnancy Intention Screening   Does the patient want to become pregnant in the next year? No    Does the patient's partner want to become pregnant in the next year? No      Contraception Wrap Up   Current Method Oral Contraceptive    End Method Oral Contraceptive             Assessment:     1. Pregnancy examination or test, negative result  - POCT urine pregnancy  2. Abnormal vaginal bleeding +bleeding for 16 days Stop birth control pills today Will rx megace to stop bleeding No sex or use condoms Call me when bleeding stops and will resume new pack OC  Meds ordered this encounter  Medications   megestrol (MEGACE) 40 MG tablet    Sig: Take 3 x 5 days then 2 x 5 days then 1 daily till bleeding stops, and call me    Dispense:  45 tablet    Refill:  0    Order Specific Question:   Supervising Provider    Answer:    Duane Lope H [[id number]]    - POCT hemoglobin     Plan:     Follow up TBD

## 2022-10-12 ENCOUNTER — Encounter: Payer: Self-pay | Admitting: Women's Health

## 2022-10-12 ENCOUNTER — Other Ambulatory Visit (INDEPENDENT_AMBULATORY_CARE_PROVIDER_SITE_OTHER): Payer: Medicaid Other

## 2022-10-12 DIAGNOSIS — R339 Retention of urine, unspecified: Secondary | ICD-10-CM

## 2022-10-12 DIAGNOSIS — R350 Frequency of micturition: Secondary | ICD-10-CM | POA: Diagnosis not present

## 2022-10-12 DIAGNOSIS — R1030 Lower abdominal pain, unspecified: Secondary | ICD-10-CM | POA: Diagnosis not present

## 2022-10-12 LAB — POCT URINALYSIS DIPSTICK OB
Glucose, UA: NEGATIVE
Ketones, UA: NEGATIVE
Leukocytes, UA: NEGATIVE
Nitrite, UA: NEGATIVE
POC,PROTEIN,UA: NEGATIVE

## 2022-10-12 NOTE — Progress Notes (Signed)
   NURSE VISIT- UTI SYMPTOMS   SUBJECTIVE:  Yvonne Stone is a 32 y.o. [phone] female here for UTI symptoms. She is a GYN patient. She reports lower abdominal pain, urinary frequency, and urinary retention started yesterday.  Just finished course of antibiotics for BV 2 days ago.   OBJECTIVE:  LMP 09/19/2022   Appears well, in no apparent distress  Results for orders placed or performed in visit on 10/12/22 (from the past 24 hour(s))  POC Urinalysis Dipstick OB   Collection Time: 10/12/22  2:35 PM  Result Value Ref Range   Color, UA     Clarity, UA     Glucose, UA Negative Negative   Bilirubin, UA     Ketones, UA neg    Spec Grav, UA     Blood, UA trace    pH, UA     POC,PROTEIN,UA Negative Negative, Trace, Small (1+), Moderate (2+), Large (3+), 4+   Urobilinogen, UA     Nitrite, UA neg    Leukocytes, UA Negative Negative   Appearance     Odor      ASSESSMENT: GYN patient with UTI symptoms and negative nitrites  PLAN: Discussed with Cyril Mourning, AGNP   Rx sent by provider today: No Urine culture sent Call or return to clinic prn if these symptoms worsen or fail to improve as anticipated. Follow-up: as needed   Jobe Marker  10/12/2022 2:36 PM

## 2022-10-13 LAB — URINALYSIS, ROUTINE W REFLEX MICROSCOPIC
Bilirubin, UA: NEGATIVE
Glucose, UA: NEGATIVE
Ketones, UA: NEGATIVE
Leukocytes,UA: NEGATIVE
Nitrite, UA: NEGATIVE
RBC, UA: NEGATIVE
Specific Gravity, UA: 1.021 (ref 1.005–1.030)
Urobilinogen, Ur: 0.2 mg/dL (ref 0.2–1.0)
pH, UA: 6.5 (ref 5.0–7.5)

## 2022-10-14 LAB — URINE CULTURE

## 2022-10-25 ENCOUNTER — Other Ambulatory Visit: Payer: Self-pay | Admitting: Women's Health

## 2022-10-25 ENCOUNTER — Encounter: Payer: Self-pay | Admitting: Women's Health

## 2022-10-25 MED ORDER — CLINDAMYCIN PHOSPHATE 2 % VA CREA: 1.0000 | TOPICAL_CREAM | Freq: Every day | VAGINAL | 0 refills | Status: DC

## 2022-11-16 NOTE — Patient Instructions (Signed)
Yvonne Stone  11/16/2022     @PREFPERIOPPHARMACY @   Your procedure is scheduled on  11/22/2022.   Report to [facility] at  0600  A.M.   Call this number if you have problems the morning of surgery:  [phone]  If you experience any cold or flu symptoms such as cough, fever, chills, shortness of breath, etc. between now and your scheduled surgery, please notify us at the above number.   Remember:  Do not eat or drink after midnight.     Take these medicines the morning of surgery with A SIP OF WATER                                        None.      Do not wear jewelry, make-up or nail polish, including gel polish,  artificial nails, or any other type of covering on natural nails (fingers and  toes).  Do not wear lotions, powders, or perfumes, or deodorant.  Do not shave 48 hours prior to surgery.  Men may shave face and neck.  Do not bring valuables to the hospital.  [facility] is not responsible for any belongings or valuables.  Contacts, dentures or bridgework may not be worn into surgery.  Leave your suitcase in the car.  After surgery it may be brought to your room.  For patients admitted to the hospital, discharge time will be determined by your treatment team.  Patients discharged the day of surgery will not be allowed to drive home and must have someone with them for 24 hours.    Special instructions:   DO NOT smoke tobacco or vape for 24 hours before your procedure.  Please read over the following fact sheets that you were given. Pain Booklet, Coughing and Deep Breathing, Surgical Site Infection Prevention, Anesthesia Post-op Instructions, and Care and Recovery After Surgery        Bilateral Salpingo-Oophorectomy, Care After The following information offers guidance on how to care for yourself after your procedure. Your health care provider may also give you more specific instructions. If you have problems or questions, contact your health  care provider. What can I expect after the procedure? After the procedure, it is common to have: Pain in your abdomen. Occasional vaginal bleeding (spotting). Tiredness. Symptoms of menopause, such as hot flashes, night sweats, and mood swings. Your recovery time will depend on which method was used for your surgery. Follow these instructions at home: Medicines Take over-the-counter and prescription medicines only as told by your health care provider. Ask your health care provider if the medicine prescribed to you: Requires you to avoid driving or using machinery. Can cause constipation. You may need to take these actions to prevent or treat constipation: Drink enough fluid to keep your urine pale yellow. Take over-the-counter or prescription medicines. Eat foods that are high in fiber, such as beans, whole grains, and fresh fruits and vegetables. Limit foods that are high in fat and processed sugars, such as fried or sweet foods. Incision care  Follow instructions from your health care provider about how to take care of your incision or incisions. Make sure you: Wash your hands with soap and water for at least 20 seconds before and after you change your bandage (dressing). If soap and water are not available, use hand sanitizer. Change your dressing as told by your  health care provider. Keep your incision area and your dressing clean and dry. Leave stitches (sutures), staples, skin glue, or adhesive strips in place. These skin closures may need to stay in place for 2 weeks or longer. If adhesive strip edges start to loosen and curl up, you may trim the loose edges. Do not remove adhesive strips completely unless your health care provider tells you to do that. Check your incision area every day for signs of infection. Check for: Redness, swelling, or pain. Fluid or blood. Warmth. Pus or a bad smell. Activity  Rest as told by your health care provider. Avoid sitting for a long time  without moving. Get up to take short walks every 1-2 hours. This is important to improve blood flow and breathing. Ask for help if you feel weak or unsteady. Return to your normal activities as told by your health care provider. Ask your health care provider what activities are safe for you. Do not drive until your health care provider says that it is safe. Do not lift anything that is heavier than 10 lb (4.5 kg), or the limit that you are told, until your health care provider says that it is safe. Do not do any activity that requires great effort. Do not douche, use tampons, or have sex until your health care provider approves. General instructions  Do not use any products that contain nicotine or tobacco. These products include cigarettes, chewing tobacco, and vaping devices, such as e-cigarettes. These can delay incision healing after surgery. If you need help quitting, ask your health care provider. Do not take baths, swim, or use a hot tub until your health care provider approves. Ask your health care provider if you may take showers. You may only be allowed to take sponge baths. Wear compression stockings as told by your health care provider. These stockings help to prevent blood clots and reduce swelling in your legs. Keep all follow-up visits. This is important. Contact a health care provider if: You have pain when you urinate. You have pus, a bad smell, or a bad-smelling discharge coming from your vagina or from an incision. You have redness, swelling, or pain around an incision or an incision feels warm to the touch. You have fluid or blood coming from an incision or an incision starts to open. You have a fever or a rash. You have abdominal pain that gets worse or does not get better with medicine. You feel light-headed, have nausea and vomiting, or both. Get help right away if: You have pain in your chest or leg. You develop shortness of breath. You faint. You have increased or  heavy vaginal bleeding, such as soaking a sanitary napkin in an hour. These symptoms may represent a serious problem that is an emergency. Do not wait to see if the symptoms will go away. Get medical help right away. Call your local emergency services (911 in the [place] Do not drive yourself to the hospital. Summary After the procedure, it is common to have pain, bleeding in the vagina, tiredness, and symptoms of menopause. Follow instructions from your health care provider about how to take care of your incision or incisions. Follow instructions from your health care provider about activities and restrictions. Check your incision area every day for signs of infection. Report any signs to your health care provider. This information is not intended to replace advice given to you by your health care provider. Make sure you discuss any questions you have with your health  care provider. Document Revised: 04/14/2020 Document Reviewed: 04/14/2020 Elsevier Patient Education  2024 [employer] General Anesthesia, Adult, Care After The following information offers guidance on how to care for yourself after your procedure. Your health care provider may also give you more specific instructions. If you have problems or questions, contact your health care provider. What can I expect after the procedure? After the procedure, it is common for people to: Have pain or discomfort at the IV site. Have nausea or vomiting. Have a sore throat or hoarseness. Have trouble concentrating. Feel cold or chills. Feel weak, sleepy, or tired (fatigue). Have soreness and body aches. These can affect parts of the body that were not involved in surgery. Follow these instructions at home: For the time period you were told by your health care provider:  Rest. Do not participate in activities where you could fall or become injured. Do not drive or use machinery. Do not drink alcohol. Do not take sleeping pills or medicines  that cause drowsiness. Do not make important decisions or sign legal documents. Do not take care of children on your own. General instructions Drink enough fluid to keep your urine pale yellow. If you have sleep apnea, surgery and certain medicines can increase your risk for breathing problems. Follow instructions from your health care provider about wearing your sleep device: Anytime you are sleeping, including during daytime naps. While taking prescription pain medicines, sleeping medicines, or medicines that make you drowsy. Return to your normal activities as told by your health care provider. Ask your health care provider what activities are safe for you. Take over-the-counter and prescription medicines only as told by your health care provider. Do not use any products that contain nicotine or tobacco. These products include cigarettes, chewing tobacco, and vaping devices, such as e-cigarettes. These can delay incision healing after surgery. If you need help quitting, ask your health care provider. Contact a health care provider if: You have nausea or vomiting that does not get better with medicine. You vomit every time you eat or drink. You have pain that does not get better with medicine. You cannot urinate or have bloody urine. You develop a skin rash. You have a fever. Get help right away if: You have trouble breathing. You have chest pain. You vomit blood. These symptoms may be an emergency. Get help right away. Call 911. Do not wait to see if the symptoms will go away. Do not drive yourself to the hospital. Summary After the procedure, it is common to have a sore throat, hoarseness, nausea, vomiting, or to feel weak, sleepy, or fatigue. For the time period you were told by your health care provider, do not drive or use machinery. Get help right away if you have difficulty breathing, have chest pain, or vomit blood. These symptoms may be an emergency. This information is not  intended to replace advice given to you by your health care provider. Make sure you discuss any questions you have with your health care provider. Document Revised: 08/20/2021 Document Reviewed: 08/20/2021 Elsevier Patient Education  2024 [employer] How to Use Chlorhexidine Before Surgery Chlorhexidine gluconate (CHG) is a germ-killing (antiseptic) solution that is used to clean the skin. It can get rid of the bacteria that normally live on the skin and can keep them away for about 24 hours. To clean your skin with CHG, you may be given: A CHG solution to use in the shower or as part of a sponge bath. A prepackaged cloth that  contains CHG. Cleaning your skin with CHG may help lower the risk for infection: While you are staying in the intensive care unit of the hospital. If you have a vascular access, such as a central line, to provide short-term or long-term access to your veins. If you have a catheter to drain urine from your bladder. If you are on a ventilator. A ventilator is a machine that helps you breathe by moving air in and out of your lungs. After surgery. What are the risks? Risks of using CHG include: A skin reaction. Hearing loss, if CHG gets in your ears and you have a perforated eardrum. Eye injury, if CHG gets in your eyes and is not rinsed out. The CHG product catching fire. Make sure that you avoid smoking and flames after applying CHG to your skin. Do not use CHG: If you have a chlorhexidine allergy or have previously reacted to chlorhexidine. On babies younger than 35 months of age. How to use CHG solution Use CHG only as told by your health care provider, and follow the instructions on the label. Use the full amount of CHG as directed. Usually, this is one bottle. During a shower Follow these steps when using CHG solution during a shower (unless your health care provider gives you different instructions): Start the shower. Use your normal soap and shampoo to wash  your face and hair. Turn off the shower or move out of the shower stream. Pour the CHG onto a clean washcloth. Do not use any type of brush or rough-edged sponge. Starting at your neck, lather your body down to your toes. Make sure you follow these instructions: If you will be having surgery, pay special attention to the part of your body where you will be having surgery. Scrub this area for at least 1 minute. Do not use CHG on your head or face. If the solution gets into your ears or eyes, rinse them well with water. Avoid your genital area. Avoid any areas of skin that have broken skin, cuts, or scrapes. Scrub your back and under your arms. Make sure to wash skin folds. Let the lather sit on your skin for 1-2 minutes or as long as told by your health care provider. Thoroughly rinse your entire body in the shower. Make sure that all body creases and crevices are rinsed well. Dry off with a clean towel. Do not put any substances on your body afterward--such as powder, lotion, or perfume--unless you are told to do so by your health care provider. Only use lotions that are recommended by the manufacturer. Put on clean clothes or pajamas. If it is the night before your surgery, sleep in clean sheets.  During a sponge bath Follow these steps when using CHG solution during a sponge bath (unless your health care provider gives you different instructions): Use your normal soap and shampoo to wash your face and hair. Pour the CHG onto a clean washcloth. Starting at your neck, lather your body down to your toes. Make sure you follow these instructions: If you will be having surgery, pay special attention to the part of your body where you will be having surgery. Scrub this area for at least 1 minute. Do not use CHG on your head or face. If the solution gets into your ears or eyes, rinse them well with water. Avoid your genital area. Avoid any areas of skin that have broken skin, cuts, or  scrapes. Scrub your back and under your arms. Make sure  to wash skin folds. Let the lather sit on your skin for 1-2 minutes or as long as told by your health care provider. Using a different clean, wet washcloth, thoroughly rinse your entire body. Make sure that all body creases and crevices are rinsed well. Dry off with a clean towel. Do not put any substances on your body afterward--such as powder, lotion, or perfume--unless you are told to do so by your health care provider. Only use lotions that are recommended by the manufacturer. Put on clean clothes or pajamas. If it is the night before your surgery, sleep in clean sheets. How to use CHG prepackaged cloths Only use CHG cloths as told by your health care provider, and follow the instructions on the label. Use the CHG cloth on clean, dry skin. Do not use the CHG cloth on your head or face unless your health care provider tells you to. When washing with the CHG cloth: Avoid your genital area. Avoid any areas of skin that have broken skin, cuts, or scrapes. Before surgery Follow these steps when using a CHG cloth to clean before surgery (unless your health care provider gives you different instructions): Using the CHG cloth, vigorously scrub the part of your body where you will be having surgery. Scrub using a back-and-forth motion for 3 minutes. The area on your body should be completely wet with CHG when you are done scrubbing. Do not rinse. Discard the cloth and let the area air-dry. Do not put any substances on the area afterward, such as powder, lotion, or perfume. Put on clean clothes or pajamas. If it is the night before your surgery, sleep in clean sheets.  For general bathing Follow these steps when using CHG cloths for general bathing (unless your health care provider gives you different instructions). Use a separate CHG cloth for each area of your body. Make sure you wash between any folds of skin and between your fingers and  toes. Wash your body in the following order, switching to a new cloth after each step: The front of your neck, shoulders, and chest. Both of your arms, under your arms, and your hands. Your stomach and groin area, avoiding the genitals. Your right leg and foot. Your left leg and foot. The back of your neck, your back, and your buttocks. Do not rinse. Discard the cloth and let the area air-dry. Do not put any substances on your body afterward--such as powder, lotion, or perfume--unless you are told to do so by your health care provider. Only use lotions that are recommended by the manufacturer. Put on clean clothes or pajamas. Contact a health care provider if: Your skin gets irritated after scrubbing. You have questions about using your solution or cloth. You swallow any chlorhexidine. Call your local poison control center [phone] in the [place] Get help right away if: Your eyes itch badly, or they become very red or swollen. Your skin itches badly and is red or swollen. Your hearing changes. You have trouble seeing. You have swelling or tingling in your mouth or throat. You have trouble breathing. These symptoms may represent a serious problem that is an emergency. Do not wait to see if the symptoms will go away. Get medical help right away. Call your local emergency services (911 in the [place] Do not drive yourself to the hospital. Summary Chlorhexidine gluconate (CHG) is a germ-killing (antiseptic) solution that is used to clean the skin. Cleaning your skin with CHG may help to lower your risk for infection.  You may be given CHG to use for bathing. It may be in a bottle or in a prepackaged cloth to use on your skin. Carefully follow your health care provider's instructions and the instructions on the product label. Do not use CHG if you have a chlorhexidine allergy. Contact your health care provider if your skin gets irritated after scrubbing. This information is not intended to  replace advice given to you by your health care provider. Make sure you discuss any questions you have with your health care provider. Document Revised: 09/20/2021 Document Reviewed: 08/03/2020 Elsevier Patient Education  2023 [employer].

## 2022-11-17 NOTE — Progress Notes (Signed)
Pt called and stated that she wanted to cancel her PAT appt and surgery. Instructed that she needed to call Dr Lawana Chambers office and cancel her surgery. Voiced understanding.

## 2022-11-18 ENCOUNTER — Encounter (HOSPITAL_COMMUNITY)
Admission: RE | Admit: 2022-11-18 | Discharge: 2022-11-18 | Disposition: A | Discharge status: Home / Self Care | Payer: Medicaid Other | Source: Ambulatory Visit | Attending: Obstetrics & Gynecology | Admitting: Obstetrics & Gynecology

## 2022-11-18 ENCOUNTER — Encounter (HOSPITAL_COMMUNITY): Payer: Self-pay

## 2022-11-21 ENCOUNTER — Other Ambulatory Visit: Payer: Self-pay | Admitting: Women's Health

## 2022-11-21 MED ORDER — FLUCONAZOLE 150 MG PO TABS: 150.0000 mg | ORAL_TABLET | Freq: Once | ORAL | 0 refills | Status: AC

## 2022-11-22 ENCOUNTER — Ambulatory Visit (HOSPITAL_COMMUNITY)
Admission: RE | Admit: 2022-11-22 | Payer: Medicaid Other | Source: Home / Self Care | Admitting: Obstetrics & Gynecology

## 2022-11-22 ENCOUNTER — Encounter (HOSPITAL_COMMUNITY): Admission: RE | Payer: Self-pay | Source: Home / Self Care

## 2022-11-22 DIAGNOSIS — Z01818 Encounter for other preprocedural examination: Secondary | ICD-10-CM

## 2022-11-22 DIAGNOSIS — D270 Benign neoplasm of right ovary: Secondary | ICD-10-CM

## 2022-11-22 SURGERY — OOPHORECTOMY, ROBOT-ASSISTED
Anesthesia: General | Laterality: Right

## 2023-01-15 ENCOUNTER — Encounter: Payer: Self-pay | Admitting: Women's Health

## 2023-01-18 ENCOUNTER — Other Ambulatory Visit (HOSPITAL_COMMUNITY)
Admission: RE | Admit: 2023-01-18 | Discharge: 2023-01-18 | Disposition: A | Discharge status: Home / Self Care | Payer: Medicaid Other | Source: Ambulatory Visit | Attending: Obstetrics & Gynecology | Admitting: Obstetrics & Gynecology

## 2023-01-18 ENCOUNTER — Other Ambulatory Visit (INDEPENDENT_AMBULATORY_CARE_PROVIDER_SITE_OTHER): Payer: Medicaid Other | Admitting: *Deleted

## 2023-01-18 DIAGNOSIS — N898 Other specified noninflammatory disorders of vagina: Secondary | ICD-10-CM | POA: Diagnosis not present

## 2023-01-18 NOTE — Progress Notes (Signed)
   NURSE VISIT- VAGINITIS/STD  SUBJECTIVE:  Yvonne Stone is a 32 y.o. [phone] GYN patientfemale here for a vaginal swab for vaginitis screening, STD screen.  She reports the following symptoms:  vaginal discharge and odor  for 1 week. Denies abnormal vaginal bleeding, significant pelvic pain, fever, or UTI symptoms.  OBJECTIVE:  There were no vitals taken for this visit.  Appears well, in no apparent distress  ASSESSMENT: Vaginal swab for vaginitis screening & STD screening.  PLAN: Self-collected vaginal probe for Gonorrhea, Chlamydia, Trichomonas, Bacterial Vaginosis, Yeast sent to lab Treatment: to be determined once results are received Follow-up as needed if symptoms persist/worsen, or new symptoms develop  Malachy Mood  01/18/2023 11:30 AM

## 2023-01-19 ENCOUNTER — Other Ambulatory Visit: Payer: Self-pay | Admitting: Adult Health

## 2023-01-19 LAB — CERVICOVAGINAL ANCILLARY ONLY
Bacterial Vaginitis (gardnerella): POSITIVE — AB
Candida Glabrata: NEGATIVE
Candida Vaginitis: NEGATIVE
Chlamydia: NEGATIVE
Comment: NEGATIVE
Comment: NEGATIVE
Comment: NEGATIVE
Comment: NEGATIVE
Comment: NEGATIVE
Comment: NORMAL
Neisseria Gonorrhea: NEGATIVE
Trichomonas: NEGATIVE

## 2023-01-19 MED ORDER — METRONIDAZOLE 500 MG PO TABS: 500.0000 mg | ORAL_TABLET | Freq: Two times a day (BID) | ORAL | 0 refills | Status: DC

## 2023-01-19 NOTE — Progress Notes (Signed)
+  BV on vaginal swab will rx flagyl,no sex or alcohol while taking  ?

## 2023-02-03 ENCOUNTER — Encounter: Payer: Self-pay | Admitting: *Deleted

## 2023-02-03 ENCOUNTER — Ambulatory Visit (INDEPENDENT_AMBULATORY_CARE_PROVIDER_SITE_OTHER): Payer: Medicaid Other | Admitting: *Deleted

## 2023-02-03 VITALS — BP 129/88 | HR 98 | Ht 65.0 in | Wt 240.5 lb

## 2023-02-03 DIAGNOSIS — Z3201 Encounter for pregnancy test, result positive: Secondary | ICD-10-CM | POA: Diagnosis not present

## 2023-02-03 LAB — POCT URINE PREGNANCY: Preg Test, Ur: POSITIVE — AB

## 2023-02-03 NOTE — Progress Notes (Signed)
   NURSE VISIT- PREGNANCY CONFIRMATION   SUBJECTIVE:  Yvonne Stone is a 32 y.o. [phone] female at Unknown by uncertain LMP of No LMP recorded (lmp unknown). Patient is pregnant. Here for pregnancy confirmation.  Home pregnancy test: positive x 4   She reports no complaints.  She is not taking prenatal vitamins.    OBJECTIVE:  BP 129/88 (BP Location: Left Arm, Patient Position: Sitting, Cuff Size: Large)   Pulse 98   Ht 5\' 5"  (1.651 m)   Wt 240 lb 8 oz (109.1 kg)   LMP  (LMP Unknown)   Breastfeeding No   BMI 40.02 kg/m   Appears well, in no apparent distress  Results for orders placed or performed in visit on 02/03/23 (from the past 24 hour(s))  POCT urine pregnancy   Collection Time: 02/03/23  9:21 AM  Result Value Ref Range   Preg Test, Ur Positive (A) Negative    ASSESSMENT: Positive pregnancy test, Unknown by LMP    PLAN: Schedule for dating ultrasound pending quant Prenatal vitamins: plans to begin OTC ASAP   Nausea medicines: not currently needed   OB packet given: Yes  Malachy Mood  02/03/2023 9:24 AM

## 2023-02-04 LAB — BETA HCG QUANT (REF LAB): hCG Quant: 6108 m[IU]/mL

## 2023-02-16 ENCOUNTER — Other Ambulatory Visit (HOSPITAL_COMMUNITY)
Admission: RE | Admit: 2023-02-16 | Discharge: 2023-02-16 | Disposition: A | Discharge status: Home / Self Care | Payer: Medicaid Other | Source: Ambulatory Visit | Attending: Obstetrics & Gynecology | Admitting: Obstetrics & Gynecology

## 2023-02-16 ENCOUNTER — Other Ambulatory Visit (INDEPENDENT_AMBULATORY_CARE_PROVIDER_SITE_OTHER): Payer: Medicaid Other

## 2023-02-16 DIAGNOSIS — N898 Other specified noninflammatory disorders of vagina: Secondary | ICD-10-CM | POA: Insufficient documentation

## 2023-02-16 NOTE — Progress Notes (Signed)
   NURSE VISIT- VAGINITIS  SUBJECTIVE:  Yvonne Stone is a 32 y.o. [phone] GYN patientfemale here for a vaginal swab for vaginitis screening.  She reports the following symptoms: discharge described as milky and odor for 3 days. States she took all of the prescribed medication last time and symptoms resolved only for a short period of time.  Denies abnormal vaginal bleeding, significant pelvic pain, fever, or UTI symptoms.  OBJECTIVE:  LMP  (LMP Unknown)   Appears well, in no apparent distress  ASSESSMENT: Vaginal swab for vaginitis screening Recent STD screening in August, no intercourse since then  PLAN: Self-collected vaginal probe for Bacterial Vaginosis, Yeast sent to lab Treatment: to be determined once results are received Follow-up as needed if symptoms persist/worsen, or new symptoms develop  Jobe Marker  02/16/2023 11:09 AM

## 2023-02-17 ENCOUNTER — Other Ambulatory Visit: Payer: Self-pay | Admitting: Adult Health

## 2023-02-17 LAB — CERVICOVAGINAL ANCILLARY ONLY
Bacterial Vaginitis (gardnerella): POSITIVE — AB
Candida Glabrata: NEGATIVE
Candida Vaginitis: NEGATIVE
Comment: NEGATIVE
Comment: NEGATIVE
Comment: NEGATIVE
Comment: NEGATIVE
Trichomonas: NEGATIVE

## 2023-02-17 MED ORDER — METRONIDAZOLE 500 MG PO TABS: 500.0000 mg | ORAL_TABLET | Freq: Two times a day (BID) | ORAL | 0 refills | Status: DC

## 2023-02-21 ENCOUNTER — Other Ambulatory Visit: Payer: Self-pay | Admitting: Obstetrics & Gynecology

## 2023-02-21 DIAGNOSIS — O3680X0 Pregnancy with inconclusive fetal viability, not applicable or unspecified: Secondary | ICD-10-CM

## 2023-02-22 ENCOUNTER — Ambulatory Visit (INDEPENDENT_AMBULATORY_CARE_PROVIDER_SITE_OTHER): Payer: Medicaid Other

## 2023-02-22 DIAGNOSIS — O3680X0 Pregnancy with inconclusive fetal viability, not applicable or unspecified: Secondary | ICD-10-CM

## 2023-02-22 DIAGNOSIS — Z3A00 Weeks of gestation of pregnancy not specified: Secondary | ICD-10-CM | POA: Diagnosis not present

## 2023-02-22 NOTE — Progress Notes (Signed)
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

## 2023-02-27 ENCOUNTER — Other Ambulatory Visit: Payer: Self-pay | Admitting: Adult Health

## 2023-02-27 MED ORDER — METRONIDAZOLE 0.75 % VA GEL: 1.0000 | Freq: Every day | VAGINAL | 0 refills | Status: DC

## 2023-02-27 NOTE — Progress Notes (Signed)
Rx metrogel

## 2023-03-22 ENCOUNTER — Encounter: Payer: Self-pay | Admitting: Women's Health

## 2023-03-22 DIAGNOSIS — O09899 Supervision of other high risk pregnancies, unspecified trimester: Secondary | ICD-10-CM | POA: Insufficient documentation

## 2023-03-22 DIAGNOSIS — Z98891 History of uterine scar from previous surgery: Secondary | ICD-10-CM | POA: Insufficient documentation

## 2023-03-22 DIAGNOSIS — Z3490 Encounter for supervision of normal pregnancy, unspecified, unspecified trimester: Secondary | ICD-10-CM | POA: Insufficient documentation

## 2023-03-24 ENCOUNTER — Other Ambulatory Visit: Payer: Self-pay | Admitting: Obstetrics & Gynecology

## 2023-03-24 ENCOUNTER — Encounter: Payer: Self-pay | Admitting: Women's Health

## 2023-03-24 DIAGNOSIS — Z3682 Encounter for antenatal screening for nuchal translucency: Secondary | ICD-10-CM

## 2023-03-27 ENCOUNTER — Encounter: Payer: Medicaid Other | Admitting: *Deleted

## 2023-03-27 ENCOUNTER — Encounter: Payer: Self-pay | Admitting: Women's Health

## 2023-03-27 ENCOUNTER — Ambulatory Visit (INDEPENDENT_AMBULATORY_CARE_PROVIDER_SITE_OTHER): Payer: Medicaid Other | Admitting: Women's Health

## 2023-03-27 ENCOUNTER — Other Ambulatory Visit: Payer: Medicaid Other

## 2023-03-27 VITALS — BP 116/68 | HR 85 | Wt 249.0 lb

## 2023-03-27 DIAGNOSIS — O09899 Supervision of other high risk pregnancies, unspecified trimester: Secondary | ICD-10-CM

## 2023-03-27 DIAGNOSIS — Z3481 Encounter for supervision of other normal pregnancy, first trimester: Secondary | ICD-10-CM

## 2023-03-27 DIAGNOSIS — Z3A12 12 weeks gestation of pregnancy: Secondary | ICD-10-CM

## 2023-03-27 DIAGNOSIS — Z3480 Encounter for supervision of other normal pregnancy, unspecified trimester: Secondary | ICD-10-CM

## 2023-03-27 DIAGNOSIS — Z98891 History of uterine scar from previous surgery: Secondary | ICD-10-CM

## 2023-03-27 DIAGNOSIS — Z6839 Body mass index (BMI) 39.0-39.9, adult: Secondary | ICD-10-CM

## 2023-03-27 DIAGNOSIS — O09891 Supervision of other high risk pregnancies, first trimester: Secondary | ICD-10-CM

## 2023-03-27 DIAGNOSIS — Z1332 Encounter for screening for maternal depression: Secondary | ICD-10-CM

## 2023-03-27 DIAGNOSIS — N838 Other noninflammatory disorders of ovary, fallopian tube and broad ligament: Secondary | ICD-10-CM

## 2023-03-27 DIAGNOSIS — Z3682 Encounter for antenatal screening for nuchal translucency: Secondary | ICD-10-CM

## 2023-03-27 DIAGNOSIS — Z131 Encounter for screening for diabetes mellitus: Secondary | ICD-10-CM

## 2023-03-27 DIAGNOSIS — F418 Other specified anxiety disorders: Secondary | ICD-10-CM

## 2023-03-27 NOTE — Patient Instructions (Signed)
Yvonne Stone, thank you for choosing our office today! We appreciate the opportunity to meet your healthcare needs. You may receive a short survey by mail, e-mail, or through [employer]. If you are happy with your care we would appreciate if you could take just a few minutes to complete the survey questions. We read all of your comments and take your feedback very seriously. Thank you again for choosing our office.  Center for [employer] Healthcare Team at [facility]  [facility] at [facility] ([address] [place], [place] [address]) Entrance C, located off of [address] Free 24/7 valet parking   Nausea & Vomiting Have saltine crackers or pretzels by your bed and eat a few bites before you raise your head out of bed in the morning Eat small frequent meals throughout the day instead of large meals Drink plenty of fluids throughout the day to stay hydrated, just don't drink a lot of fluids with your meals.  This can make your stomach fill up faster making you feel sick Do not brush your teeth right after you eat Products with real ginger are good for nausea, like ginger ale and ginger hard candy Make sure it says made with real ginger! Sucking on sour candy like lemon heads is also good for nausea If your prenatal vitamins make you nauseated, take them at night so you will sleep through the nausea Sea Bands If you feel like you need medicine for the nausea & vomiting please let us know If you are unable to keep any fluids or food down please let us know   Constipation Drink plenty of fluid, preferably water, throughout the day Eat foods high in fiber such as fruits, vegetables, and grains Exercise, such as walking, is a good way to keep your bowels regular Drink warm fluids, especially warm prune juice, or decaf coffee Eat a 1/2 cup of real oatmeal (not instant), 1/2 cup applesauce, and 1/2-1 cup warm prune juice every day If needed, you may take Colace (docusate sodium) stool softener  once or twice a day to help keep the stool soft.  If you still are having problems with constipation, you may take Miralax once daily as needed to help keep your bowels regular.   Home Blood Pressure Monitoring for Patients   Your provider has recommended that you check your blood pressure (BP) at least once a week at home. If you do not have a blood pressure cuff at home, one will be provided for you. Contact your provider if you have not received your monitor within 1 week.   Helpful Tips for Accurate Home Blood Pressure Checks  Don't smoke, exercise, or drink caffeine 30 minutes before checking your BP Use the restroom before checking your BP (a full bladder can raise your pressure) Relax in a comfortable upright chair Feet on the ground Left arm resting comfortably on a flat surface at the level of your heart Legs uncrossed Back supported Sit quietly and don't talk Place the cuff on your bare arm Adjust snuggly, so that only two fingertips can fit between your skin and the top of the cuff Check 2 readings separated by at least one minute Keep a log of your BP readings For a visual, please reference this diagram: http://ccnc.care/bpdiagram  Provider Name: [facility]     Phone: [phone]  Zone 1: ALL CLEAR  Continue to monitor your symptoms:  BP reading is less than 140 (top number) or less than 90 (bottom  number)  No right upper stomach pain No headaches or seeing spots No feeling nauseated or throwing up No swelling in face and hands  Zone 2: CAUTION Call your doctor's office for any of the following:  BP reading is greater than 140 (top number) or greater than 90 (bottom number)  Stomach pain under your ribs in the middle or right side Headaches or seeing spots Feeling nauseated or throwing up Swelling in face and hands  Zone 3: EMERGENCY  Seek immediate medical care if you have any of the following:  BP reading is greater than160 (top number) or greater than  110 (bottom number) Severe headaches not improving with Tylenol Serious difficulty catching your breath Any worsening symptoms from Zone 2    First Trimester of Pregnancy The first trimester of pregnancy is from week 1 until the end of week 12 (months 1 through 3). A week after a sperm fertilizes an egg, the egg will implant on the wall of the uterus. This embryo will begin to develop into a baby. Genes from you and your partner are forming the baby. The female genes determine whether the baby is a boy or a girl. At 6-8 weeks, the eyes and face are formed, and the heartbeat can be seen on ultrasound. At the end of 12 weeks, all the baby's organs are formed.  Now that you are pregnant, you will want to do everything you can to have a healthy baby. Two of the most important things are to get good prenatal care and to follow your health care provider's instructions. Prenatal care is all the medical care you receive before the baby's birth. This care will help prevent, find, and treat any problems during the pregnancy and childbirth. BODY CHANGES Your body goes through many changes during pregnancy. The changes vary from woman to woman.  You may gain or lose a couple of pounds at first. You may feel sick to your stomach (nauseous) and throw up (vomit). If the vomiting is uncontrollable, call your health care provider. You may tire easily. You may develop headaches that can be relieved by medicines approved by your health care provider. You may urinate more often. Painful urination may mean you have a bladder infection. You may develop heartburn as a result of your pregnancy. You may develop constipation because certain hormones are causing the muscles that push waste through your intestines to slow down. You may develop hemorrhoids or swollen, bulging veins (varicose veins). Your breasts may begin to grow larger and become tender. Your nipples may stick out more, and the tissue that surrounds them  (areola) may become darker. Your gums may bleed and may be sensitive to brushing and flossing. Dark spots or blotches (chloasma, mask of pregnancy) may develop on your face. This will likely fade after the baby is born. Your menstrual periods will stop. You may have a loss of appetite. You may develop cravings for certain kinds of food. You may have changes in your emotions from day to day, such as being excited to be pregnant or being concerned that something may go wrong with the pregnancy and baby. You may have more vivid and strange dreams. You may have changes in your hair. These can include thickening of your hair, rapid growth, and changes in texture. Some women also have hair loss during or after pregnancy, or hair that feels dry or thin. Your hair will most likely return to normal after your baby is born. WHAT TO EXPECT AT YOUR PRENATAL  VISITS During a routine prenatal visit: You will be weighed to make sure you and the baby are growing normally. Your blood pressure will be taken. Your abdomen will be measured to track your baby's growth. The fetal heartbeat will be listened to starting around week 10 or 12 of your pregnancy. Test results from any previous visits will be discussed. Your health care provider may ask you: How you are feeling. If you are feeling the baby move. If you have had any abnormal symptoms, such as leaking fluid, bleeding, severe headaches, or abdominal cramping. If you have any questions. Other tests that may be performed during your first trimester include: Blood tests to find your blood type and to check for the presence of any previous infections. They will also be used to check for low iron levels (anemia) and Rh antibodies. Later in the pregnancy, blood tests for diabetes will be done along with other tests if problems develop. Urine tests to check for infections, diabetes, or protein in the urine. An ultrasound to confirm the proper growth and development  of the baby. An amniocentesis to check for possible genetic problems. Fetal screens for spina bifida and Down syndrome. You may need other tests to make sure you and the baby are doing well. HOME CARE INSTRUCTIONS  Medicines Follow your health care provider's instructions regarding medicine use. Specific medicines may be either safe or unsafe to take during pregnancy. Take your prenatal vitamins as directed. If you develop constipation, try taking a stool softener if your health care provider approves. Diet Eat regular, well-balanced meals. Choose a variety of foods, such as meat or vegetable-based protein, fish, milk and low-fat dairy products, vegetables, fruits, and whole grain breads and cereals. Your health care provider will help you determine the amount of weight gain that is right for you. Avoid raw meat and uncooked cheese. These carry germs that can cause birth defects in the baby. Eating four or five small meals rather than three large meals a day may help relieve nausea and vomiting. If you start to feel nauseous, eating a few soda crackers can be helpful. Drinking liquids between meals instead of during meals also seems to help nausea and vomiting. If you develop constipation, eat more high-fiber foods, such as fresh vegetables or fruit and whole grains. Drink enough fluids to keep your urine clear or pale yellow. Activity and Exercise Exercise only as directed by your health care provider. Exercising will help you: Control your weight. Stay in shape. Be prepared for labor and delivery. Experiencing pain or cramping in the lower abdomen or low back is a good sign that you should stop exercising. Check with your health care provider before continuing normal exercises. Try to avoid standing for long periods of time. Move your legs often if you must stand in one place for a long time. Avoid heavy lifting. Wear low-heeled shoes, and practice good posture. You may continue to have sex  unless your health care provider directs you otherwise. Relief of Pain or Discomfort Wear a good support bra for breast tenderness.   Take warm sitz baths to soothe any pain or discomfort caused by hemorrhoids. Use hemorrhoid cream if your health care provider approves.   Rest with your legs elevated if you have leg cramps or low back pain. If you develop varicose veins in your legs, wear support hose. Elevate your feet for 15 minutes, 3-4 times a day. Limit salt in your diet. Prenatal Care Schedule your prenatal visits by the  twelfth week of pregnancy. They are usually scheduled monthly at first, then more often in the last 2 months before delivery. Write down your questions. Take them to your prenatal visits. Keep all your prenatal visits as directed by your health care provider. Safety Wear your seat belt at all times when driving. Make a list of emergency phone numbers, including numbers for family, friends, the hospital, and police and fire departments. General Tips Ask your health care provider for a referral to a local prenatal education class. Begin classes no later than at the beginning of month 6 of your pregnancy. Ask for help if you have counseling or nutritional needs during pregnancy. Your health care provider can offer advice or refer you to specialists for help with various needs. Do not use hot tubs, steam rooms, or saunas. Do not douche or use tampons or scented sanitary pads. Do not cross your legs for long periods of time. Avoid cat litter boxes and soil used by cats. These carry germs that can cause birth defects in the baby and possibly loss of the fetus by miscarriage or stillbirth. Avoid all smoking, herbs, alcohol, and medicines not prescribed by your health care provider. Chemicals in these affect the formation and growth of the baby. Schedule a dentist appointment. At home, brush your teeth with a soft toothbrush and be gentle when you floss. SEEK MEDICAL CARE IF:   You have dizziness. You have mild pelvic cramps, pelvic pressure, or nagging pain in the abdominal area. You have persistent nausea, vomiting, or diarrhea. You have a bad smelling vaginal discharge. You have pain with urination. You notice increased swelling in your face, hands, legs, or ankles. SEEK IMMEDIATE MEDICAL CARE IF:  You have a fever. You are leaking fluid from your vagina. You have spotting or bleeding from your vagina. You have severe abdominal cramping or pain. You have rapid weight gain or loss. You vomit blood or material that looks like coffee grounds. You are exposed to [place] measles and have never had them. You are exposed to fifth disease or chickenpox. You develop a severe headache. You have shortness of breath. You have any kind of trauma, such as from a fall or a car accident. Document Released: 05/17/2001 Document Revised: 10/07/2013 Document Reviewed: 04/02/2013 [facility] Patient Information 2015 [place], [place]. This information is not intended to replace advice given to you by your health care provider. Make sure you discuss any questions you have with your health care provider.

## 2023-03-27 NOTE — Progress Notes (Signed)
[place] 12+5 wks,measurements c/w dates,FHR 147 bpm,NB present,NT 1.6 mm,posterior placenta,normal left ovary,right dermoid N/C 7.6 x 3.1 x 4.2 cm

## 2023-03-27 NOTE — Progress Notes (Signed)
INITIAL OBSTETRICAL VISIT Patient name: Yvonne Stone MRN [id number]  Date of birth: 1990-07-14 Chief Complaint:   Initial Prenatal Visit (Still using metrogel-odor/discharge)  History of Present Illness:   Yvonne Stone is a 32 y.o. [phone] African-American female at [redacted]w[redacted]d by Yvonne Stone at 8 weeks with an Estimated Date of Delivery: 10/04/23 being seen today for her initial obstetrical visit.   No LMP recorded (lmp unknown). Patient is pregnant. Her obstetrical history is significant for term C/S x 3, 1st for NRFHR then ERCS x 2. Last one Jan 2024 at [facility]-didn't think she could make it to [facility].  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 Not much interest in doing things. Eats a lot. Denies SI/HI. Yvonne Stone  Last pap 12/27/21. Results were: ASCUS w/ HRHPV negative     03/27/2023   11:07 AM 08/09/2022    2:31 PM 12/27/2021    2:46 PM 12/16/2016   11:50 AM 12/16/2016   11:49 AM  Depression screen PHQ 2/9  Decreased Interest 3 2 2 1 1   Down, Depressed, Hopeless 3 1 2 1 1   PHQ - 2 Score 6 3 4 2 2   Altered sleeping 3 3 2 2    Tired, decreased energy 3 3 2 2    Change in appetite 3 0 2 2   Feeling bad or failure about yourself  3 1 2 1    Trouble concentrating 3 0 2 1   Moving slowly or fidgety/restless 1 0 0 1   Suicidal thoughts 0 0 0 0   PHQ-9 Score 22 10 14 11          03/27/2023   11:07 AM 08/09/2022    2:33 PM 12/27/2021    2:46 PM  GAD 7 : Generalized Anxiety Score  Nervous, Anxious, on Edge 3 1 2   Control/stop worrying 3 3 2   Worry too much - different things 3 2 2   Trouble relaxing 3 3 2   Restless 1 0 0  Easily annoyed or irritable 2 3 2   Afraid - awful might happen 2 1 2   Total GAD 7 Score 17 13 12      Review of Systems:   Pertinent items are noted in HPI Denies cramping/contractions, leakage of fluid, vaginal bleeding, abnormal vaginal  discharge w/ itching/odor/irritation, headaches, visual changes, shortness of breath, chest pain, abdominal pain, severe nausea/vomiting, or problems with urination or bowel movements unless otherwise stated above.  Pertinent History Reviewed:  Reviewed past medical,surgical, social, obstetrical and family history.  Reviewed problem list, medications and allergies. OB History  Gravida Para Term Preterm AB Living  5 3 3   1 3   SAB IAB Ectopic Multiple Live Births  1     0 3    # Outcome Date GA Lbr Len/2nd Weight Sex Type Anes PTL Lv  5 Current           4 Term 06/16/22 [redacted]w[redacted]d  6 lb (2.722 kg) M CS-LTranv Spinal N LIV  3 Term 07/15/16 [redacted]w[redacted]d  7 lb 2.8 oz (3.255 kg) M CS-LTranv Spinal  LIV  2 Term 02/22/14 [redacted]w[redacted]d  6 lb 10 oz (3.005 kg) M CS-LTranv  N LIV     Complications: Fetal Intolerance  1 SAB            Physical Assessment:   Vitals:   03/27/23  1124  BP: 116/68  Pulse: 85  Weight: 249 lb (112.9 kg)  Body mass index is 41.44 kg/m.       Physical Examination:  General appearance - well appearing, and in no distress  Mental status - alert, oriented to person, place, and time  Psych:  She has a normal mood and affect  Skin - warm and dry, normal color, no suspicious lesions noted  Chest - effort normal, all lung fields clear to auscultation bilaterally  Heart - normal rate and regular rhythm  Abdomen - soft, nontender  Extremities:  No swelling or varicosities noted  Thin prep pap is not done   Chaperone: N/A    TODAY'S NT Yvonne Stone 12+5 wks,measurements c/w dates,FHR 147 bpm,NB present,NT 1.6 mm,posterior placenta,normal left ovary,right dermoid N/C 7.6 x 3.1 x 4.2 cm   No results found for this or any previous visit (from the past 24 hour(s)).  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 To discuss removal w/ MD during this pregnancy  6) Dep/anx> scanned LunaJoy QR code today and is making appt for therapy/meds  7) PG BMI 39> currently 41, discussed food choices, increasing activity, recommended no more than 11-15lb weight gain; plan EFW 32wks  8) Chronic BV> ok to continue metrogel  Meds: No orders of the defined types were placed in this encounter.   Initial labs obtained Continue prenatal vitamins Reviewed n/v relief measures and warning s/s to report Reviewed recommended weight gain based on pre-gravid BMI Encouraged well-balanced diet Genetic & carrier screening discussed: requests NT/IT, declines Panorama, AFP, and Horizon  Ultrasound discussed; fetal survey: requested CCNC completed> form faxed if has or is planning to apply for medicaid The nature of [employer] for [employer] with multiple MDs and other Advanced Practice Providers was explained to patient; also emphasized that fellows, residents, and students are part of our team. Does have home bp cuff. Office bp cuff given: no. Rx sent: n/a. Check bp weekly, let Yvonne Stone know if consistently >140/90.   Indications for ASA therapy (per uptodate) OR Two or more of the following: Obesity (BMI>30 kg/m2) Yes Sociodemographic characteristics (African American race, low socioeconomic level) Yes  Indications for early A1C (per uptodate) BMI >=25 (>=23 in Asian women) AND one of the following High-risk race/ethnicity (eg, African American, Latino, Native American, [place] American, [place] Islander) Yes  Follow-up: Return in about 4 weeks (around 04/24/2023) for LROB, 2nd IT, CNM, in person; then 8wks from now anatomy u/s and LROB w/ MD.   Orders Placed This Encounter  Procedures   Urine Culture   GC/Chlamydia Probe Amp   Integrated 1   CBC/D/Plt+RPR+Rh+ABO+RubIgG...   PANORAMA PRENATAL TEST   Hemoglobin A1c    Cheral Marker CNM,  [facility] 03/27/2023 12:31 PM

## 2023-03-29 LAB — GC/CHLAMYDIA PROBE AMP
Chlamydia trachomatis, NAA: NEGATIVE
Neisseria Gonorrhoeae by PCR: NEGATIVE

## 2023-03-29 LAB — URINE CULTURE

## 2023-04-09 ENCOUNTER — Encounter: Payer: Self-pay | Admitting: Women's Health

## 2023-04-11 ENCOUNTER — Other Ambulatory Visit (INDEPENDENT_AMBULATORY_CARE_PROVIDER_SITE_OTHER): Payer: Medicaid Other

## 2023-04-11 ENCOUNTER — Other Ambulatory Visit (HOSPITAL_COMMUNITY)
Admission: RE | Admit: 2023-04-11 | Discharge: 2023-04-11 | Disposition: A | Discharge status: Home / Self Care | Payer: Medicaid Other | Source: Ambulatory Visit | Attending: Obstetrics & Gynecology | Admitting: Obstetrics & Gynecology

## 2023-04-11 VITALS — BP 109/72 | HR 87 | Wt 242.0 lb

## 2023-04-11 DIAGNOSIS — N898 Other specified noninflammatory disorders of vagina: Secondary | ICD-10-CM | POA: Diagnosis present

## 2023-04-11 DIAGNOSIS — Z3A14 14 weeks gestation of pregnancy: Secondary | ICD-10-CM

## 2023-04-11 DIAGNOSIS — O09899 Supervision of other high risk pregnancies, unspecified trimester: Secondary | ICD-10-CM

## 2023-04-11 DIAGNOSIS — O26892 Other specified pregnancy related conditions, second trimester: Secondary | ICD-10-CM

## 2023-04-11 DIAGNOSIS — Z3480 Encounter for supervision of other normal pregnancy, unspecified trimester: Secondary | ICD-10-CM | POA: Diagnosis present

## 2023-04-11 NOTE — Progress Notes (Signed)
   NURSE VISIT- VAGINITIS/STD/POC  SUBJECTIVE:  Yvonne Stone is a 32 y.o. [id number] [redacted]w[redacted]d pregnantfemale here for a vaginal swab for vaginitis screening.  She reports the following symptoms: discharge described as grey and odor for a while. Metrogel didn't help.   Denies abnormal vaginal bleeding, significant pelvic pain, fever, or UTI symptoms.  OBJECTIVE:  BP 109/72   Pulse 87   Wt 242 lb (109.8 kg)   LMP  (LMP Unknown)   BMI 40.27 kg/m   Appears well, in no apparent distress  ASSESSMENT: Vaginal swab for vaginitis screening  PLAN: Self-collected vaginal probe for Gonorrhea, Chlamydia, Trichomonas, Bacterial Vaginosis, Yeast sent to lab Treatment: to be determined once results are received Follow-up as needed if symptoms persist/worsen, or new symptoms develop Keep next scheduled prenatal appointment.  Caralyn Guile  04/11/2023 2:27 PM

## 2023-04-13 ENCOUNTER — Other Ambulatory Visit: Payer: Self-pay | Admitting: Women's Health

## 2023-04-13 LAB — CERVICOVAGINAL ANCILLARY ONLY
Bacterial Vaginitis (gardnerella): NEGATIVE
Candida Glabrata: NEGATIVE
Candida Vaginitis: NEGATIVE
Chlamydia: NEGATIVE
Comment: NEGATIVE
Comment: NEGATIVE
Comment: NEGATIVE
Comment: NEGATIVE
Comment: NEGATIVE
Comment: NORMAL
Neisseria Gonorrhea: NEGATIVE
Trichomonas: NEGATIVE

## 2023-04-13 MED ORDER — METRONIDAZOLE 500 MG PO TABS
500.0000 mg | ORAL_TABLET | Freq: Two times a day (BID) | ORAL | 0 refills | Status: DC
Start: 2023-04-13 — End: 2023-04-24

## 2023-04-24 ENCOUNTER — Ambulatory Visit (INDEPENDENT_AMBULATORY_CARE_PROVIDER_SITE_OTHER): Payer: Medicaid Other | Admitting: Women's Health

## 2023-04-24 ENCOUNTER — Encounter: Payer: Self-pay | Admitting: Women's Health

## 2023-04-24 VITALS — BP 116/71 | HR 96 | Wt 251.2 lb

## 2023-04-24 DIAGNOSIS — Z131 Encounter for screening for diabetes mellitus: Secondary | ICD-10-CM

## 2023-04-24 DIAGNOSIS — Z3480 Encounter for supervision of other normal pregnancy, unspecified trimester: Secondary | ICD-10-CM

## 2023-04-24 DIAGNOSIS — Z3482 Encounter for supervision of other normal pregnancy, second trimester: Secondary | ICD-10-CM

## 2023-04-24 DIAGNOSIS — Z3A16 16 weeks gestation of pregnancy: Secondary | ICD-10-CM

## 2023-04-24 DIAGNOSIS — Z6839 Body mass index (BMI) 39.0-39.9, adult: Secondary | ICD-10-CM

## 2023-04-24 NOTE — Patient Instructions (Signed)
Yvonne Stone, thank you for choosing our office today! We appreciate the opportunity to meet your healthcare needs. You may receive a short survey by mail, e-mail, or through MyChart. If you are happy with your care we would appreciate if you could take just a few minutes to complete the survey questions. We read all of your comments and take your feedback very seriously. Thank you again for choosing our office.  [facility] Team at [facility] ([address] [place], [place] [address]) Entrance C, located off of [address] Free 24/7 valet parking  Go to [email or web address] to register for FREE online childbirth classes  Call the office ([phone]) or go to [facility] if: You begin to severe cramping Your water breaks.  Sometimes it is a big gush of fluid, sometimes it is just a trickle that keeps getting your panties wet or running down your legs You have vaginal bleeding.  It is normal to have a small amount of spotting if your cervix was checked.   [place] Pediatricians/Family Doctors [facility] ([facility]): [address], [phone]           [facility]: [address], [phone]                [facility] ([facility]): [address], [phone] (call to ask if accepting patients) [facility]: [address], [place], [phone]    [place] Pediatricians/Family Doctors [facility] ([facility]): [address], [phone] [facility]: [address], [phone] [facility]: [address], [phone]  [place] [facility]  [facility] ([facility]): [phone] [facility]: [address], [phone]   [place] Family Doctors [facility]: [address], [phone]  [place] [facility]  [facility]: [address], [phone]  Home Blood Pressure Monitoring for Patients   Your provider has recommended that you check your blood pressure (BP) at least once a week at home. If you do not have a blood pressure cuff at home, one will be provided for you. Contact your provider if you have not received your monitor within 1 week.   Helpful Tips for Accurate Home Blood Pressure Checks  Don't smoke, exercise, or drink caffeine 30 minutes before checking your BP Use the restroom before checking your BP (a full bladder can raise your pressure) Relax in a comfortable upright chair Feet on the ground Left arm resting comfortably on a flat surface at the level of your heart Legs uncrossed Back supported Sit quietly and don't talk Place the cuff on your bare arm Adjust snuggly, so that only two fingertips can fit between your skin and the top of the cuff Check 2 readings separated by at least one minute Keep a log of your BP readings For a visual, please reference this diagram: http://ccnc.care/bpdiagram  Provider Name: [facility]     Phone: [phone]  Zone 1: ALL CLEAR  Continue to monitor your symptoms:  BP reading is less than 140 (top number) or less than 90 (bottom number)  No right upper stomach pain No headaches or seeing spots No feeling nauseated or throwing up No swelling in face and hands  Zone 2: CAUTION Call your doctor's office for any of the following:  BP reading is greater than 140 (top number) or greater than   90 (bottom number)  Stomach pain under your ribs in the middle or right side Headaches or seeing spots Feeling nauseated or throwing up Swelling in face and hands  Zone 3: EMERGENCY  Seek immediate medical care if you have any of the following:  BP reading is greater than160 (top number) or greater than 110 (bottom number) Severe headaches not improving with Tylenol Serious difficulty catching your breath Any worsening symptoms from  Zone 2     Second Trimester of Pregnancy The second trimester is from week 14 through week 27 (months 4 through 6). The second trimester is often a time when you feel your best. Your body has adjusted to being pregnant, and you begin to feel better physically. Usually, morning sickness has lessened or quit completely, you may have more energy, and you may have an increase in appetite. The second trimester is also a time when the fetus is growing rapidly. At the end of the sixth month, the fetus is about 9 inches long and weighs about 1 pounds. You will likely begin to feel the baby move (quickening) between 16 and 20 weeks of pregnancy. Body changes during your second trimester Your body continues to go through many changes during your second trimester. The changes vary from woman to woman. Your weight will continue to increase. You will notice your lower abdomen bulging out. You may begin to get stretch marks on your hips, abdomen, and breasts. You may develop headaches that can be relieved by medicines. The medicines should be approved by your health care provider. You may urinate more often because the fetus is pressing on your bladder. You may develop or continue to have heartburn as a result of your pregnancy. You may develop constipation because certain hormones are causing the muscles that push waste through your intestines to slow down. You may develop hemorrhoids or swollen, bulging veins (varicose veins). You may have back pain. This is caused by: Weight gain. Pregnancy hormones that are relaxing the joints in your pelvis. A shift in weight and the muscles that support your balance. Your breasts will continue to grow and they will continue to become tender. Your gums may bleed and may be sensitive to brushing and flossing. Dark spots or blotches (chloasma, mask of pregnancy) may develop on your face. This will likely fade after the baby is born. A dark line from your belly button to  the pubic area (linea nigra) may appear. This will likely fade after the baby is born. You may have changes in your hair. These can include thickening of your hair, rapid growth, and changes in texture. Some women also have hair loss during or after pregnancy, or hair that feels dry or thin. Your hair will most likely return to normal after your baby is born.  What to expect at prenatal visits During a routine prenatal visit: You will be weighed to make sure you and the fetus are growing normally. Your blood pressure will be taken. Your abdomen will be measured to track your baby's growth. The fetal heartbeat will be listened to. Any test results from the previous visit will be discussed.  Your health care provider may ask you: How you are feeling. If you are feeling the baby move. If you have had any abnormal symptoms, such as leaking fluid, bleeding, severe headaches, or abdominal cramping. If you are using any tobacco products, including cigarettes, chewing tobacco, and electronic cigarettes. If you have any questions.  Other tests that may be performed during   your second trimester include: Blood tests that check for: Low iron levels (anemia). High blood sugar that affects pregnant women (gestational diabetes) between 24 and 28 weeks. Rh antibodies. This is to check for a protein on red blood cells (Rh factor). Urine tests to check for infections, diabetes, or protein in the urine. An ultrasound to confirm the proper growth and development of the baby. An amniocentesis to check for possible genetic problems. Fetal screens for spina bifida and Down syndrome. HIV (human immunodeficiency virus) testing. Routine prenatal testing includes screening for HIV, unless you choose not to have this test.  Follow these instructions at home: Medicines Follow your health care provider's instructions regarding medicine use. Specific medicines may be either safe or unsafe to take during  pregnancy. Take a prenatal vitamin that contains at least 600 micrograms (mcg) of folic acid. If you develop constipation, try taking a stool softener if your health care provider approves. Eating and drinking Eat a balanced diet that includes fresh fruits and vegetables, whole grains, good sources of protein such as meat, eggs, or tofu, and low-fat dairy. Your health care provider will help you determine the amount of weight gain that is right for you. Avoid raw meat and uncooked cheese. These carry germs that can cause birth defects in the baby. If you have low calcium intake from food, talk to your health care provider about whether you should take a daily calcium supplement. Limit foods that are high in fat and processed sugars, such as fried and sweet foods. To prevent constipation: Drink enough fluid to keep your urine clear or pale yellow. Eat foods that are high in fiber, such as fresh fruits and vegetables, whole grains, and beans. Activity Exercise only as directed by your health care provider. Most women can continue their usual exercise routine during pregnancy. Try to exercise for 30 minutes at least 5 days a week. Stop exercising if you experience uterine contractions. Avoid heavy lifting, wear low heel shoes, and practice good posture. A sexual relationship may be continued unless your health care provider directs you otherwise. Relieving pain and discomfort Wear a good support bra to prevent discomfort from breast tenderness. Take warm sitz baths to soothe any pain or discomfort caused by hemorrhoids. Use hemorrhoid cream if your health care provider approves. Rest with your legs elevated if you have leg cramps or low back pain. If you develop varicose veins, wear support hose. Elevate your feet for 15 minutes, 3-4 times a day. Limit salt in your diet. Prenatal Care Write down your questions. Take them to your prenatal visits. Keep all your prenatal visits as told by your health  care provider. This is important. Safety Wear your seat belt at all times when driving. Make a list of emergency phone numbers, including numbers for family, friends, the hospital, and police and fire departments. General instructions Ask your health care provider for a referral to a local prenatal education class. Begin classes no later than the beginning of month 6 of your pregnancy. Ask for help if you have counseling or nutritional needs during pregnancy. Your health care provider can offer advice or refer you to specialists for help with various needs. Do not use hot tubs, steam rooms, or saunas. Do not douche or use tampons or scented sanitary pads. Do not cross your legs for long periods of time. Avoid cat litter boxes and soil used by cats. These carry germs that can cause birth defects in the baby and possibly loss of the   fetus by miscarriage or stillbirth. Avoid all smoking, herbs, alcohol, and unprescribed drugs. Chemicals in these products can affect the formation and growth of the baby. Do not use any products that contain nicotine or tobacco, such as cigarettes and e-cigarettes. If you need help quitting, ask your health care provider. Visit your dentist if you have not gone yet during your pregnancy. Use a soft toothbrush to brush your teeth and be gentle when you floss. Contact a health care provider if: You have dizziness. You have mild pelvic cramps, pelvic pressure, or nagging pain in the abdominal area. You have persistent nausea, vomiting, or diarrhea. You have a bad smelling vaginal discharge. You have pain when you urinate. Get help right away if: You have a fever. You are leaking fluid from your vagina. You have spotting or bleeding from your vagina. You have severe abdominal cramping or pain. You have rapid weight gain or weight loss. You have shortness of breath with chest pain. You notice sudden or extreme swelling of your face, hands, ankles, feet, or legs. You  have not felt your baby move in over an hour. You have severe headaches that do not go away when you take medicine. You have vision changes. Summary The second trimester is from week 14 through week 27 (months 4 through 6). It is also a time when the fetus is growing rapidly. Your body goes through many changes during pregnancy. The changes vary from woman to woman. Avoid all smoking, herbs, alcohol, and unprescribed drugs. These chemicals affect the formation and growth your baby. Do not use any tobacco products, such as cigarettes, chewing tobacco, and e-cigarettes. If you need help quitting, ask your health care provider. Contact your health care provider if you have any questions. Keep all prenatal visits as told by your health care provider. This is important. This information is not intended to replace advice given to you by your health care provider. Make sure you discuss any questions you have with your health care provider. Document Released: 05/17/2001 Document Revised: 10/29/2015 Document Reviewed: 07/24/2012 Elsevier Interactive Patient Education  2017 Elsevier Inc.  

## 2023-04-24 NOTE — Progress Notes (Signed)
LOW-RISK PREGNANCY VISIT Patient name: Yvonne Stone MRN [id number]  Date of birth: 1991-01-01 Chief Complaint:   Routine Prenatal Visit  History of Present Illness:   Yvonne Stone is a 32 y.o. [id number] female at [redacted]w[redacted]d with an Estimated Date of Delivery: 10/04/23 being seen today for ongoing management of a low-risk pregnancy.   Today she reports  didn't get bloodwork last visit, has balance w/ [facility] . Finished metronidazole, hasn't started boric acid suppositories. Didn't finish LunaJoy referral, phone turned off.  Contractions: Not present. Vag. Bleeding: None.   . denies leaking of fluid.     03/27/2023   11:07 AM 08/09/2022    2:31 PM 12/27/2021    2:46 PM 12/16/2016   11:50 AM 12/16/2016   11:49 AM  Depression screen PHQ 2/9  Decreased Interest 3 2 2 1 1   Down, Depressed, Hopeless 3 1 2 1 1   PHQ - 2 Score 6 3 4 2 2   Altered sleeping 3 3 2 2    Tired, decreased energy 3 3 2 2    Change in appetite 3 0 2 2   Feeling bad or failure about yourself  3 1 2 1    Trouble concentrating 3 0 2 1   Moving slowly or fidgety/restless 1 0 0 1   Suicidal thoughts 0 0 0 0   PHQ-9 Score 22 10 14 11          03/27/2023   11:07 AM 08/09/2022    2:33 PM 12/27/2021    2:46 PM  GAD 7 : Generalized Anxiety Score  Nervous, Anxious, on Edge 3 1 2   Control/stop worrying 3 3 2   Worry too much - different things 3 2 2   Trouble relaxing 3 3 2   Restless 1 0 0  Easily annoyed or irritable 2 3 2   Afraid - awful might happen 2 1 2   Total GAD 7 Score 17 13 12       Review of Systems:   Pertinent items are noted in HPI Denies abnormal vaginal discharge w/ itching/odor/irritation, headaches, visual changes, shortness of breath, chest pain, abdominal pain, severe nausea/vomiting, or problems with urination or bowel movements unless otherwise stated above. Pertinent History Reviewed:  Reviewed past medical,surgical, social, obstetrical and family history.  Reviewed problem list, medications and  allergies. Physical Assessment:   Vitals:   04/24/23 1339  BP: 116/71  Pulse: 96  Weight: 251 lb 3.2 oz (113.9 kg)  Body mass index is 41.8 kg/m.        Physical Examination:   General appearance: Well appearing, and in no distress  Mental status: Alert, oriented to person, place, and time  Skin: Warm & dry  Cardiovascular: Normal heart rate noted  Respiratory: Normal respiratory effort, no distress  Abdomen: Soft, gravid, nontender  Pelvic: Cervical exam deferred         Extremities: Edema: None  Fetal Status: Fetal Heart Rate (bpm): 148        Chaperone: N/A   No results found for this or any previous visit (from the past 24 hour(s)).  Assessment & Plan:  1) Low-risk pregnancy [id number] at [redacted]w[redacted]d with an Estimated Date of Delivery: 10/04/23   2) PG BMI 39  3) Prev c/s x3> for RCS w/ possible removal of 7cm dermoid  4) Dep/anx> scanned LunaJoy QR code again today, let us know if any issues getting appt  5) Recurrent BV> last swab was neg but pt felt like she definitely had it, rx'd flagyl, per [facility]  can do boric acid suppositories 2x/wk   Meds: No orders of the defined types were placed in this encounter.  Labs/procedures today:  pn1/A1c/panorama at [facility] (has balance w/ [facility])- pt states if [facility] won't do courtesy draw for panorama does not want drawn. Can't do 2nd IT b/c never had 1st IT  Plan:  Continue routine obstetrical care  Next visit: prefers will be in person for u/s     Reviewed: Preterm labor symptoms and general obstetric precautions including but not limited to vaginal bleeding, contractions, leaking of fluid and fetal movement were reviewed in detail with the patient.  All questions were answered. Does have home bp cuff. Office bp cuff given: not applicable. Check bp weekly, let us know if consistently >140 and/or >90.  Follow-up: Return for As scheduled.  Future Appointments  Date Time Provider Department Center  05/22/2023 10:00 AM [facility] [place]  [facility] None  05/22/2023 10:50 AM Myna Hidalgo, DO [facility]    Orders Placed This Encounter  Procedures   Hemoglobin A1c   CBC   HIV Antibody (routine testing w rflx)   RPR   Rubella screen   Hepatitis C antibody   Hepatitis B Surface AntiGEN   Antibody screen   Cheral Marker CNM, WHNP-BC 04/24/2023 2:00 PM

## 2023-04-26 LAB — CBC
HCT: 33.5 % — ABNORMAL LOW (ref 35.0–45.0)
Hemoglobin: 11.2 g/dL — ABNORMAL LOW (ref 11.7–15.5)
MCH: 27.7 pg (ref 27.0–33.0)
MCHC: 33.4 g/dL (ref 32.0–36.0)
MCV: 82.9 fL (ref 80.0–100.0)
MPV: 11.2 fL (ref 7.5–12.5)
Platelets: 208 10*3/uL (ref 140–400)
RBC: 4.04 10*6/uL (ref 3.80–5.10)
RDW: 12.6 % (ref 11.0–15.0)
WBC: 6.8 10*3/uL (ref 3.8–10.8)

## 2023-04-26 LAB — RUBELLA SCREEN: Rubella: 1.43 {index}

## 2023-04-26 LAB — RPR: RPR Ser Ql: NONREACTIVE

## 2023-04-26 LAB — HEPATITIS C ANTIBODY: Hepatitis C Ab: NONREACTIVE

## 2023-04-26 LAB — HEPATITIS B SURFACE ANTIGEN: Hepatitis B Surface Ag: NONREACTIVE

## 2023-04-26 LAB — HEMOGLOBIN A1C
Hgb A1c MFr Bld: 5.3 %{Hb} (ref <5.7)
Mean Plasma Glucose: 105 mg/dL
eAG (mmol/L): 5.8 mmol/L

## 2023-04-26 LAB — HIV ANTIBODY (ROUTINE TESTING W REFLEX): HIV 1&2 Ab, 4th Generation: NONREACTIVE

## 2023-04-26 LAB — ANTIBODY SCREEN: Antibody Screen: NOT DETECTED

## 2023-05-19 ENCOUNTER — Other Ambulatory Visit: Payer: Self-pay | Admitting: Obstetrics & Gynecology

## 2023-05-19 DIAGNOSIS — Z363 Encounter for antenatal screening for malformations: Secondary | ICD-10-CM

## 2023-05-22 ENCOUNTER — Encounter: Payer: Self-pay | Admitting: Obstetrics & Gynecology

## 2023-05-22 ENCOUNTER — Other Ambulatory Visit (INDEPENDENT_AMBULATORY_CARE_PROVIDER_SITE_OTHER): Payer: Medicaid Other

## 2023-05-22 ENCOUNTER — Ambulatory Visit (INDEPENDENT_AMBULATORY_CARE_PROVIDER_SITE_OTHER): Payer: Medicaid Other | Admitting: Obstetrics & Gynecology

## 2023-05-22 VITALS — BP 118/63 | HR 78 | Wt 248.2 lb

## 2023-05-22 DIAGNOSIS — Z3A20 20 weeks gestation of pregnancy: Secondary | ICD-10-CM

## 2023-05-22 DIAGNOSIS — F418 Other specified anxiety disorders: Secondary | ICD-10-CM

## 2023-05-22 DIAGNOSIS — Z3480 Encounter for supervision of other normal pregnancy, unspecified trimester: Secondary | ICD-10-CM

## 2023-05-22 DIAGNOSIS — N83201 Unspecified ovarian cyst, right side: Secondary | ICD-10-CM

## 2023-05-22 DIAGNOSIS — Z98891 History of uterine scar from previous surgery: Secondary | ICD-10-CM

## 2023-05-22 DIAGNOSIS — Z3482 Encounter for supervision of other normal pregnancy, second trimester: Secondary | ICD-10-CM

## 2023-05-22 DIAGNOSIS — Z363 Encounter for antenatal screening for malformations: Secondary | ICD-10-CM | POA: Diagnosis not present

## 2023-05-22 NOTE — Progress Notes (Signed)
[place] 20+5 wks,cephalic,fundal placenta gr 0,cx 4.7 cm,SVP of fluid 4.2 cm,right dermoid n/c 7.2 x 2.9 x  3.8 cm,normal left ovary,FHR 144 bpm,EFW 356 g 32%,anatomy complete

## 2023-05-22 NOTE — Progress Notes (Signed)
LOW-RISK PREGNANCY VISIT Patient name: Yvonne Stone MRN [id number]  Date of birth: [birth year] Chief Complaint:   Routine Prenatal Visit (Ultrasound today)  History of Present Illness:   Yvonne Stone is a 32 y.o. [phone] female at [redacted]w[redacted]d with an Estimated Date of Delivery: 10/04/23 being seen today for ongoing management of a low-risk pregnancy.   -Right dermoid cyst -prior C-section x3 -Obesity     03/27/2023   11:07 AM 08/09/2022    2:31 PM 12/27/2021    2:46 PM 12/16/2016   11:50 AM 12/16/2016   11:49 AM  Depression screen PHQ 2/9  Decreased Interest 3 2 2 1 1   Down, Depressed, Hopeless 3 1 2 1 1   PHQ - 2 Score 6 3 4 2 2   Altered sleeping 3 3 2 2    Tired, decreased energy 3 3 2 2    Change in appetite 3 0 2 2   Feeling bad or failure about yourself  3 1 2 1    Trouble concentrating 3 0 2 1   Moving slowly or fidgety/restless 1 0 0 1   Suicidal thoughts 0 0 0 0   PHQ-9 Score 22 10 14 11     Today she is noting some leg and back pain.  Pt standing/lifting at work and having a tough time.  Contractions: Not present. Vag. Bleeding: None.  Movement: Absent. denies leaking of fluid. Review of Systems:   Pertinent items are noted in HPI Denies abnormal vaginal discharge w/ itching/odor/irritation, headaches, visual changes, shortness of breath, chest pain, abdominal pain, severe nausea/vomiting, or problems with urination or bowel movements unless otherwise stated above. Pertinent History Reviewed:  Reviewed past medical,surgical, social, obstetrical and family history.  Reviewed problem list, medications and allergies.  Physical Assessment:   Vitals:   05/22/23 1114  BP: 118/63  Pulse: 78  Weight: 248 lb 3.2 oz (112.6 kg)  Body mass index is 41.3 kg/m.        Physical Examination:   General appearance: Well appearing, and in no distress  Mental status: Alert, oriented to person, place, and time  Skin: Warm & dry  Respiratory: Normal respiratory effort, no  distress  Abdomen: Soft, gravid, nontender  Pelvic: Cervical exam deferred         Extremities: Edema: None  Psych:  mood and affect appropriate  Fetal Status:     Movement: Absent   cephalic,fundal placenta gr 0,cx 4.7 cm,SVP of fluid 4.2 cm,right dermoid n/c 7.2 x 2.9 x 3.8 cm,normal left ovary,FHR 144 bpm,EFW 356 g 32%,anatomy complete   Chaperone: n/a    No results found for this or any previous visit (from the past 24 hours).   Assessment & Plan:  1) Low-risk pregnancy G5P3013 at [redacted]w[redacted]d with an Estimated Date of Delivery: 10/04/23   -Prior C-section x 3 Plan for repeat, does not desire sterilization  -dermoid cyst  []  plan for removal at time of C-section  -Obesity Repeat growth @ 32wk- order placed  -work note written   Meds: No orders of the defined types were placed in this encounter.  Labs/procedures today: anatomy scan  Plan:  Continue routine obstetrical care and as outlined above, growth scan @ 32wks Next visit: prefers in person    Reviewed: Preterm labor symptoms and general obstetric precautions including but not limited to vaginal bleeding, contractions, leaking of fluid and fetal movement were reviewed in detail with the patient.  All questions were answered. Pt has home bp cuff. Check bp weekly, let us  know if >140/90.   Follow-up: Return in about 4 weeks (around 06/19/2023) for LROB visit.  Orders Placed This Encounter  Procedures   US OB Follow Up    Myna Hidalgo, DO Attending Obstetrician & Gynecologist, [facility] for [employer], [facility]

## 2023-05-28 ENCOUNTER — Encounter: Payer: Self-pay | Admitting: Women's Health

## 2023-06-06 ENCOUNTER — Encounter: Payer: Self-pay | Admitting: Women's Health

## 2023-06-20 ENCOUNTER — Ambulatory Visit (INDEPENDENT_AMBULATORY_CARE_PROVIDER_SITE_OTHER): Payer: Medicaid Other | Admitting: Women's Health

## 2023-06-20 ENCOUNTER — Encounter: Payer: Self-pay | Admitting: Women's Health

## 2023-06-20 VITALS — BP 102/67 | HR 89 | Wt 248.0 lb

## 2023-06-20 DIAGNOSIS — Z3A24 24 weeks gestation of pregnancy: Secondary | ICD-10-CM

## 2023-06-20 DIAGNOSIS — O99342 Other mental disorders complicating pregnancy, second trimester: Secondary | ICD-10-CM

## 2023-06-20 DIAGNOSIS — F418 Other specified anxiety disorders: Secondary | ICD-10-CM

## 2023-06-20 DIAGNOSIS — Z3480 Encounter for supervision of other normal pregnancy, unspecified trimester: Secondary | ICD-10-CM

## 2023-06-20 DIAGNOSIS — Z3482 Encounter for supervision of other normal pregnancy, second trimester: Secondary | ICD-10-CM

## 2023-06-20 NOTE — Patient Instructions (Signed)
 Yvonne Stone, thank you for choosing our office today! We appreciate the opportunity to meet your healthcare needs. You may receive a short survey by mail, e-mail, or through [employer]. If you are happy with your care we would appreciate if you could take just a few minutes to complete the survey questions. We read all of your comments and take your feedback very seriously. Thank you again for choosing our office.  Center for [employer] Team at [facility]  [facility] at [facility] ([address] [place], [place] [address]) Entrance C, located off of [address] Free 24/7 valet parking   You will have your sugar test next visit.  Please do not eat or drink anything after midnight the night before you come, not even water .  You will be here for at least two hours.  Please make an appointment online for the bloodwork at [email or web address] for 8:00am (or as close to this as possible). Make sure you select the [facility] service center.   CLASSES: Go to [email or web address] to register for classes (childbirth, breastfeeding, waterbirth, infant CPR, daddy bootcamp, etc.)  Call the office [phone]) or go to [facility] if: You begin to have strong, frequent contractions Your water  breaks.  Sometimes it is a big gush of fluid, sometimes it is just a trickle that keeps getting your panties wet or running down your legs You have vaginal bleeding.  It is normal to have a small amount of spotting if your cervix was checked.  You don't feel your baby moving like normal.  If you don't, get you something to eat and drink and lay down and focus on feeling your baby move.   If your baby is still not moving like normal, you should call the office or go to [facility].  Call the office [phone]) or go to [facility] hospital for these signs of pre-eclampsia: Severe headache that does not go away with Tylenol  Visual changes- seeing spots, double, blurred vision Pain under your right breast or upper  abdomen that does not go away with Tums or heartburn medicine Nausea and/or vomiting Severe swelling in your hands, feet, and face    [place] Pediatricians/Family Doctors [facility]): [address] Luba BROCKS, [phone]           [facility]: [address], [phone]                [facility]): [address], [phone]  [facility]: [address], [place], [phone]    [facility] Pediatricians/Family Doctors [facility]): [address], [phone] [facility]: [address] [place], [phone] [facility] [place]: [address], [phone]  [facility] Doctors  [facility]): [phone] [facility]: [address], [phone]   [facility] Doctors [facility]: [address] [phone]  [facility] Doctors  [facility]: [phone], [phone]  Home Blood Pressure Monitoring for Patients   Your provider has recommended that you check your blood pressure (BP) at least once a week at home. If you do not have a blood pressure cuff at home, one will be provided for you. Contact your provider if you have not received your monitor within 1 week.   Helpful Tips for Accurate Home Blood Pressure Checks  Don't smoke, exercise, or drink caffeine  30 minutes before checking  your BP Use the restroom before checking your BP (a full bladder can raise your pressure) Relax in a comfortable upright chair Feet on the ground Left arm resting comfortably on a flat surface at the level of your heart Legs uncrossed Back supported Sit quietly and don't talk Place the cuff on your bare arm Adjust snuggly, so that only two fingertips can fit between your skin and the top of the cuff Check 2 readings separated by at least one  minute Keep a log of your BP readings For a visual, please reference this diagram: http://ccnc.care/bpdiagram  Provider Name: [facility]     Phone: [phone]  Zone 1: ALL CLEAR  Continue to monitor your symptoms:  BP reading is less than 140 (top number) or less than 90 (bottom number)  No right upper stomach pain No headaches or seeing spots No feeling nauseated or throwing up No swelling in face and hands  Zone 2: CAUTION Call your doctor's office for any of the following:  BP reading is greater than 140 (top number) or greater than 90 (bottom number)  Stomach pain under your ribs in the middle or right side Headaches or seeing spots Feeling nauseated or throwing up Swelling in face and hands  Zone 3: EMERGENCY  Seek immediate medical care if you have any of the following:  BP reading is greater than160 (top number) or greater than 110 (bottom number) Severe headaches not improving with Tylenol  Serious difficulty catching your breath Any worsening symptoms from Zone 2   Second Trimester of Pregnancy The second trimester is from week 13 through week 28, months 4 through 6. The second trimester is often a time when you feel your best. Your body has also adjusted to being pregnant, and you begin to feel better physically. Usually, morning sickness has lessened or quit completely, you may have more energy, and you may have an increase in appetite. The second trimester is also a time when the fetus is growing rapidly. At the end of the sixth month, the fetus is about 9 inches long and weighs about 1 pounds. You will likely begin to feel the baby move (quickening) between 18 and 20 weeks of the pregnancy. BODY CHANGES Your body goes through many changes during pregnancy. The changes vary from woman to woman.  Your weight will continue to increase. You will notice your lower abdomen bulging out. You may begin to get stretch marks on your hips, abdomen, and breasts. You may  develop headaches that can be relieved by medicines approved by your health care provider. You may urinate more often because the fetus is pressing on your bladder. You may develop or continue to have heartburn as a result of your pregnancy. You may develop constipation because certain hormones are causing the muscles that push waste through your intestines to slow down. You may develop hemorrhoids or swollen, bulging veins (varicose veins). You may have back pain because of the weight gain and pregnancy hormones relaxing your joints between the bones in your pelvis and as a result of a shift in weight and the muscles that support your balance. Your breasts will continue to grow and be tender. Your gums may bleed and may be sensitive to brushing and flossing. Dark spots or blotches (chloasma, mask of pregnancy) may develop on your face. This will likely fade after the baby is born. A dark line from your belly button to the pubic area (linea nigra) may appear. This will likely fade after the  baby is born. You may have changes in your hair. These can include thickening of your hair, rapid growth, and changes in texture. Some women also have hair loss during or after pregnancy, or hair that feels dry or thin. Your hair will most likely return to normal after your baby is born. WHAT TO EXPECT AT YOUR PRENATAL VISITS During a routine prenatal visit: You will be weighed to make sure you and the fetus are growing normally. Your blood pressure will be taken. Your abdomen will be measured to track your baby's growth. The fetal heartbeat will be listened to. Any test results from the previous visit will be discussed. Your health care provider may ask you: How you are feeling. If you are feeling the baby move. If you have had any abnormal symptoms, such as leaking fluid, bleeding, severe headaches, or abdominal cramping. If you have any questions. Other tests that may be performed during your second  trimester include: Blood tests that check for: Low iron levels (anemia). Gestational diabetes (between 24 and 28 weeks). Rh antibodies. Urine tests to check for infections, diabetes, or protein in the urine. An ultrasound to confirm the proper growth and development of the baby. An amniocentesis to check for possible genetic problems. Fetal screens for spina bifida and Down syndrome. HOME CARE INSTRUCTIONS  Avoid all smoking, herbs, alcohol, and unprescribed drugs. These chemicals affect the formation and growth of the baby. Follow your health care provider's instructions regarding medicine use. There are medicines that are either safe or unsafe to take during pregnancy. Exercise only as directed by your health care provider. Experiencing uterine cramps is a good sign to stop exercising. Continue to eat regular, healthy meals. Wear a good support bra for breast tenderness. Do not use hot tubs, steam rooms, or saunas. Wear your seat belt at all times when driving. Avoid raw meat, uncooked cheese, cat litter boxes, and soil used by cats. These carry germs that can cause birth defects in the baby. Take your prenatal vitamins. Try taking a stool softener (if your health care provider approves) if you develop constipation. Eat more high-fiber foods, such as fresh vegetables or fruit and whole grains. Drink plenty of fluids to keep your urine clear or pale yellow. Take warm sitz baths to soothe any pain or discomfort caused by hemorrhoids. Use hemorrhoid cream if your health care provider approves. If you develop varicose veins, wear support hose. Elevate your feet for 15 minutes, 3-4 times a day. Limit salt in your diet. Avoid heavy lifting, wear low heel shoes, and practice good posture. Rest with your legs elevated if you have leg cramps or low back pain. Visit your dentist if you have not gone yet during your pregnancy. Use a soft toothbrush to brush your teeth and be gentle when you floss. A  sexual relationship may be continued unless your health care provider directs you otherwise. Continue to go to all your prenatal visits as directed by your health care provider. SEEK MEDICAL CARE IF:  You have dizziness. You have mild pelvic cramps, pelvic pressure, or nagging pain in the abdominal area. You have persistent nausea, vomiting, or diarrhea. You have a bad smelling vaginal discharge. You have pain with urination. SEEK IMMEDIATE MEDICAL CARE IF:  You have a fever. You are leaking fluid from your vagina. You have spotting or bleeding from your vagina. You have severe abdominal cramping or pain. You have rapid weight gain or loss. You have shortness of breath with chest pain. You  notice sudden or extreme swelling of your face, hands, ankles, feet, or legs. You have not felt your baby move in over an hour. You have severe headaches that do not go away with medicine. You have vision changes. Document Released: 05/17/2001 Document Revised: 05/28/2013 Document Reviewed: 07/24/2012 [facility] Patient Information 2015 [place], [place]. This information is not intended to replace advice given to you by your health care provider. Make sure you discuss any questions you have with your health care provider.

## 2023-06-20 NOTE — Progress Notes (Signed)
 LOW-RISK PREGNANCY VISIT Patient name: Yvonne Stone MRN [id number]  Date of birth: December 02, 1990 Chief Complaint:   Routine Prenatal Visit  History of Present Illness:   Yvonne Stone is a 33 y.o. [id number] female at [redacted]w[redacted]d with an Estimated Date of Delivery: 10/04/23 being seen today for ongoing management of a low-risk pregnancy.   Today she reports  pain in front of pelvis at night and during day some . Never finished [facility] referral. Feels ok most days.  Contractions: Not present.  .  Movement: Present. denies leaking of fluid.     03/27/2023   11:07 AM 08/09/2022    2:31 PM 12/27/2021    2:46 PM 12/16/2016   11:50 AM 12/16/2016   11:49 AM  Depression screen PHQ 2/9  Decreased Interest 3 2 2 1 1   Down, Depressed, Hopeless 3 1 2 1 1   PHQ - 2 Score 6 3 4 2 2   Altered sleeping 3 3 2 2    Tired, decreased energy 3 3 2 2    Change in appetite 3 0 2 2   Feeling bad or failure about yourself  3 1 2 1    Trouble concentrating 3 0 2 1   Moving slowly or fidgety/restless 1 0 0 1   Suicidal thoughts 0 0 0 0   PHQ-9 Score 22 10 14 11          03/27/2023   11:07 AM 08/09/2022    2:33 PM 12/27/2021    2:46 PM  GAD 7 : Generalized Anxiety Score  Nervous, Anxious, on Edge 3 1 2   Control/stop worrying 3 3 2   Worry too much - different things 3 2 2   Trouble relaxing 3 3 2   Restless 1 0 0  Easily annoyed or irritable 2 3 2   Afraid - awful might happen 2 1 2   Total GAD 7 Score 17 13 12       Review of Systems:   Pertinent items are noted in HPI Denies abnormal vaginal discharge w/ itching/odor/irritation, headaches, visual changes, shortness of breath, chest pain, abdominal pain, severe nausea/vomiting, or problems with urination or bowel movements unless otherwise stated above. Pertinent History Reviewed:  Reviewed past medical,surgical, social, obstetrical and family history.  Reviewed problem list, medications and allergies. Physical Assessment:   Vitals:   06/20/23 1145 06/20/23  1155  BP: (!) 142/72 102/67  Pulse: 89 89  Weight: 248 lb (112.5 kg)   Body mass index is 41.27 kg/m.        Physical Examination:   General appearance: Well appearing, and in no distress  Mental status: Alert, oriented to person, place, and time  Skin: Warm & dry  Cardiovascular: Normal heart rate noted  Respiratory: Normal respiratory effort, no distress  Abdomen: Soft, gravid, nontender  Pelvic: Cervical exam deferred         Extremities: Edema: None  Fetal Status: Fetal Heart Rate (bpm): 158 Fundal Height: 26 cm Movement: Present    Chaperone: N/A   No results found for this or any previous visit (from the past 24 hours).  Assessment & Plan:  1) Low-risk pregnancy G5P3013 at [redacted]w[redacted]d with an Estimated Date of Delivery: 10/04/23   2) Symphysis Pubis Dysfunction, discussed trying compression underwear, changing positions slowly  3) Prev c/s x 3> for RCS w/ removal Rt 7.6cm dermoid  4) Dep/anx- no meds, hasn't made appt w/ [facility], [facility] referral entered  5) 1st bp elevated> 2nd nowhere close to 1st and no h/o HTN  Meds: No orders of the defined types were placed in this encounter.  Labs/procedures today: none  Plan:  Continue routine obstetrical care  Next visit: prefers will be in person for pn2     Reviewed: Preterm labor symptoms and general obstetric precautions including but not limited to vaginal bleeding, contractions, leaking of fluid and fetal movement were reviewed in detail with the patient.  All questions were answered. Does have home bp cuff. Office bp cuff given: not applicable. Check bp weekly, let us  know if consistently >140 and/or >90.  Follow-up: Return in about 4 weeks (around 07/18/2023) for LROB, PN2, CNM, in person.  No future appointments.   Orders Placed This Encounter  Procedures   Amb ref to [facility]   Suzen JONELLE Fetters CNM, [facility] 06/20/2023 12:15 PM

## 2023-07-11 ENCOUNTER — Encounter: Payer: Self-pay | Admitting: Women's Health

## 2023-07-18 ENCOUNTER — Encounter: Payer: Self-pay | Admitting: Women's Health

## 2023-07-18 ENCOUNTER — Ambulatory Visit (INDEPENDENT_AMBULATORY_CARE_PROVIDER_SITE_OTHER): Payer: Medicaid Other | Admitting: Obstetrics & Gynecology

## 2023-07-18 ENCOUNTER — Encounter: Payer: Self-pay | Admitting: Obstetrics & Gynecology

## 2023-07-18 ENCOUNTER — Other Ambulatory Visit: Payer: Medicaid Other

## 2023-07-18 VITALS — BP 120/70 | HR 86 | Wt 244.0 lb

## 2023-07-18 DIAGNOSIS — Z131 Encounter for screening for diabetes mellitus: Secondary | ICD-10-CM

## 2023-07-18 DIAGNOSIS — Z3A28 28 weeks gestation of pregnancy: Secondary | ICD-10-CM

## 2023-07-18 DIAGNOSIS — Z98891 History of uterine scar from previous surgery: Secondary | ICD-10-CM

## 2023-07-18 DIAGNOSIS — Z3480 Encounter for supervision of other normal pregnancy, unspecified trimester: Secondary | ICD-10-CM

## 2023-07-18 DIAGNOSIS — Z3483 Encounter for supervision of other normal pregnancy, third trimester: Secondary | ICD-10-CM

## 2023-07-18 NOTE — Progress Notes (Signed)
.    LOW-RISK PREGNANCY VISIT Patient name: Yvonne Stone MRN [id number]  Date of birth: 12/26/1990 Chief Complaint:   Routine Prenatal Visit  History of Present Illness:   Yvonne Stone is a 33 y.o. [id number] female at [redacted]w[redacted]d with an Estimated Date of Delivery: 10/04/23 being seen today for ongoing management of a low-risk pregnancy.     03/27/2023   11:07 AM 08/09/2022    2:31 PM 12/27/2021    2:46 PM 12/16/2016   11:50 AM 12/16/2016   11:49 AM  Depression screen PHQ 2/9  Decreased Interest 3 2 2 1 1   Down, Depressed, Hopeless 3 1 2 1 1   PHQ - 2 Score 6 3 4 2 2   Altered sleeping 3 3 2 2    Tired, decreased energy 3 3 2 2    Change in appetite 3 0 2 2   Feeling bad or failure about yourself  3 1 2 1    Trouble concentrating 3 0 2 1   Moving slowly or fidgety/restless 1 0 0 1   Suicidal thoughts 0 0 0 0   PHQ-9 Score 22 10 14 11      Today she reports no complaints. Contractions: Not present. Vag. Bleeding: None.  Movement: Present. denies leaking of fluid. Review of Systems:   Pertinent items are noted in HPI Denies abnormal vaginal discharge w/ itching/odor/irritation, headaches, visual changes, shortness of breath, chest pain, abdominal pain, severe nausea/vomiting, or problems with urination or bowel movements unless otherwise stated above. Pertinent History Reviewed:  Reviewed past medical,surgical, social, obstetrical and family history.  Reviewed problem list, medications and allergies. Physical Assessment:   Vitals:   07/18/23 0920  BP: 120/70  Pulse: 86  Weight: 244 lb (110.7 kg)  Body mass index is 40.6 kg/m.        Physical Examination:   General appearance: Well appearing, and in no distress  Mental status: Alert, oriented to person, place, and time  Skin: Warm & dry  Cardiovascular: Normal heart rate noted  Respiratory: Normal respiratory effort, no distress  Abdomen: Soft, gravid, nontender  Pelvic: Cervical exam deferred         Extremities:    Fetal  Status: Fetal Heart Rate (bpm): 151 Fundal Height: 29 cm Movement: Present    Chaperone: n/a    No results found for this or any previous visit (from the past 24 hours).  Assessment & Plan:  1) Low-risk pregnancy [id number] at [redacted]w[redacted]d with an Estimated Date of Delivery: 10/04/23   2) Previous C section, x 3, will sign BTL papers   Meds: No orders of the defined types were placed in this encounter.  Labs/procedures today: PN2  Plan:  Continue routine obstetrical care  Next visit: prefers in person      Follow-up: Return in about 3 weeks (around 08/08/2023) for LROB.  No orders of the defined types were placed in this encounter.   Lazaro Arms, MD 07/18/2023 10:06 AM

## 2023-07-19 LAB — CBC
Hematocrit: 33.4 % — ABNORMAL LOW (ref 34.0–46.6)
Hemoglobin: 10.9 g/dL — ABNORMAL LOW (ref 11.1–15.9)
MCH: 27.1 pg (ref 26.6–33.0)
MCHC: 32.6 g/dL (ref 31.5–35.7)
MCV: 83 fL (ref 79–97)
Platelets: 217 10*3/uL (ref 150–450)
RBC: 4.02 x10E6/uL (ref 3.77–5.28)
RDW: 12.5 % (ref 11.7–15.4)
WBC: 6 10*3/uL (ref 3.4–10.8)

## 2023-07-19 LAB — GLUCOSE TOLERANCE, 2 HOURS W/ 1HR
Glucose, 1 hour: 152 mg/dL (ref 70–179)
Glucose, 2 hour: 98 mg/dL (ref 70–152)
Glucose, Fasting: 89 mg/dL (ref 70–91)

## 2023-07-19 LAB — HIV ANTIBODY (ROUTINE TESTING W REFLEX): HIV Screen 4th Generation wRfx: NONREACTIVE

## 2023-07-19 LAB — RPR: RPR Ser Ql: NONREACTIVE

## 2023-07-19 LAB — ANTIBODY SCREEN: Antibody Screen: NEGATIVE

## 2023-07-20 NOTE — BH Specialist Note (Deleted)
 Integrated Behavioral Health via Telemedicine Visit  07/20/2023 Yvonne Stone [id number]  Number of Integrated Behavioral Health Clinician visits: 1- Initial Visit  Session Start time: 1417   Session End time: 1452  Total time in minutes: 35   Referring Provider: Cam Hai, CNM Patient/Family location: Home*** [facility] Provider location: [facility]  All persons participating in visit: Patient Yvonne Stone and [facility] Yvonne Stone ***  Types of Service: {CHL AMB TYPE OF SERVICE:[phone]}  I connected with Yvonne Stone and/or Yvonne Stone's {family members:20773} via  Telephone or Engineer, civil (consulting)  (Video is Surveyor, mining) and verified that I am speaking with the correct person using two identifiers. Discussed confidentiality: Yes   I discussed the limitations of telemedicine and the availability of in person appointments.  Discussed there is a possibility of technology failure and discussed alternative modes of communication if that failure occurs.  I discussed that engaging in this telemedicine visit, they consent to the provision of behavioral healthcare and the services will be billed under their insurance.  Patient and/or legal guardian expressed understanding and consented to Telemedicine visit: Yes   Presenting Concerns: Patient and/or family reports the following symptoms/concerns: *** Duration of problem: ***; Severity of problem: {Mild/Moderate/Severe:20260}  Patient and/or Family's Strengths/Protective Factors: {CHL AMB BH PROTECTIVE FACTORS:[id number]}  Goals Addressed: Patient will:  Reduce symptoms of: {IBH Symptoms:21014056}   Increase knowledge and/or ability of: {IBH Patient Tools:21014057}   Demonstrate ability to: {IBH Goals:21014053}  Progress towards Goals: {CHL AMB BH PROGRESS TOWARDS [phone]}  Interventions: Interventions utilized:  {IBH  Interventions:21014054} Standardized Assessments completed: {IBH Screening Tools:21014051}  Patient and/or Family Response: Patient agrees with treatment plan.   Assessment: Patient currently experiencing ***.   Patient may benefit from continued therapeutic intervention *** .  Plan: Follow up with behavioral health clinician on : *** Behavioral recommendations:  -*** -*** Referral(s): {IBH Referrals:21014055}  I discussed the assessment and treatment plan with the patient and/or parent/guardian. They were provided an opportunity to ask questions and all were answered. They agreed with the plan and demonstrated an understanding of the instructions.   They were advised to call back or seek an in-person evaluation if the symptoms worsen or if the condition fails to improve as anticipated.  Rae Lips, LCSW     03/27/2023   11:07 AM 08/09/2022    2:31 PM 12/27/2021    2:46 PM 12/16/2016   11:50 AM 12/16/2016   11:49 AM  Depression screen PHQ 2/9  Decreased Interest 3 2 2 1 1   Down, Depressed, Hopeless 3 1 2 1 1   PHQ - 2 Score 6 3 4 2 2   Altered sleeping 3 3 2 2    Tired, decreased energy 3 3 2 2    Change in appetite 3 0 2 2   Feeling bad or failure about yourself  3 1 2 1    Trouble concentrating 3 0 2 1   Moving slowly or fidgety/restless 1 0 0 1   Suicidal thoughts 0 0 0 0   PHQ-9 Score 22 10 14 11        03/27/2023   11:07 AM 08/09/2022    2:33 PM 12/27/2021    2:46 PM  GAD 7 : Generalized Anxiety Score  Nervous, Anxious, on Edge 3 1 2   Control/stop worrying 3 3 2   Worry too much - different things 3 2 2   Trouble relaxing 3 3 2   Restless 1 0 0  Easily annoyed or irritable 2 3 2   Afraid - awful might happen 2 1 2   Total GAD 7 Score 17 13 12    '

## 2023-07-26 ENCOUNTER — Encounter: Payer: Self-pay | Admitting: Women's Health

## 2023-08-08 ENCOUNTER — Encounter: Payer: Self-pay | Admitting: Women's Health

## 2023-08-08 ENCOUNTER — Ambulatory Visit (INDEPENDENT_AMBULATORY_CARE_PROVIDER_SITE_OTHER): Payer: Medicaid Other | Admitting: Women's Health

## 2023-08-08 VITALS — BP 108/65 | HR 95 | Wt 250.0 lb

## 2023-08-08 DIAGNOSIS — Z6841 Body Mass Index (BMI) 40.0 and over, adult: Secondary | ICD-10-CM

## 2023-08-08 DIAGNOSIS — Z3A31 31 weeks gestation of pregnancy: Secondary | ICD-10-CM

## 2023-08-08 DIAGNOSIS — Z3480 Encounter for supervision of other normal pregnancy, unspecified trimester: Secondary | ICD-10-CM | POA: Diagnosis not present

## 2023-08-08 DIAGNOSIS — Z3483 Encounter for supervision of other normal pregnancy, third trimester: Secondary | ICD-10-CM | POA: Diagnosis not present

## 2023-08-08 NOTE — Progress Notes (Signed)
 LOW-RISK PREGNANCY VISIT Patient name: Yvonne Stone MRN [id number]  Date of birth: 1991-04-03 Chief Complaint:   Routine Prenatal Visit  History of Present Illness:   Yvonne Stone is a 33 y.o. [id number] female at [redacted]w[redacted]d with an Estimated Date of Delivery: 10/04/23 being seen today for ongoing management of a low-risk pregnancy.   Today she reports no complaints. Contractions: Irregular. Vag. Bleeding: None.  Movement: Present. denies leaking of fluid.     03/27/2023   11:07 AM 08/09/2022    2:31 PM 12/27/2021    2:46 PM 12/16/2016   11:50 AM 12/16/2016   11:49 AM  Depression screen PHQ 2/9  Decreased Interest 3 2 2 1 1   Down, Depressed, Hopeless 3 1 2 1 1   PHQ - 2 Score 6 3 4 2 2   Altered sleeping 3 3 2 2    Tired, decreased energy 3 3 2 2    Change in appetite 3 0 2 2   Feeling bad or failure about yourself  3 1 2 1    Trouble concentrating 3 0 2 1   Moving slowly or fidgety/restless 1 0 0 1   Suicidal thoughts 0 0 0 0   PHQ-9 Score 22 10 14 11          03/27/2023   11:07 AM 08/09/2022    2:33 PM 12/27/2021    2:46 PM  GAD 7 : Generalized Anxiety Score  Nervous, Anxious, on Edge 3 1 2   Control/stop worrying 3 3 2   Worry too much - different things 3 2 2   Trouble relaxing 3 3 2   Restless 1 0 0  Easily annoyed or irritable 2 3 2   Afraid - awful might happen 2 1 2   Total GAD 7 Score 17 13 12       Review of Systems:   Pertinent items are noted in HPI Denies abnormal vaginal discharge w/ itching/odor/irritation, headaches, visual changes, shortness of breath, chest pain, abdominal pain, severe nausea/vomiting, or problems with urination or bowel movements unless otherwise stated above. Pertinent History Reviewed:  Reviewed past medical,surgical, social, obstetrical and family history.  Reviewed problem list, medications and allergies. Physical Assessment:   Vitals:   08/08/23 0904  BP: 108/65  Pulse: 95  Weight: 250 lb (113.4 kg)  Body mass index is 41.6  kg/m.        Physical Examination:   General appearance: Well appearing, and in no distress  Mental status: Alert, oriented to person, place, and time  Skin: Warm & dry  Cardiovascular: Normal heart rate noted  Respiratory: Normal respiratory effort, no distress  Abdomen: Soft, gravid, nontender  Pelvic: Cervical exam deferred         Extremities: Edema: None  Fetal Status: Fetal Heart Rate (bpm): 160 Fundal Height: 32 cm Movement: Present    Chaperone: N/A No results found for this or any previous visit (from the past 24 hours).  Assessment & Plan:  1) Low-risk pregnancy G5P3013 at [redacted]w[redacted]d with an Estimated Date of Delivery: 10/04/23   2) Prev c/s x 3, for RCS w/ Rt 7cm dermoid removal  3) PG BMI 39> EFW next visit   Meds: No orders of the defined types were placed in this encounter.  Labs/procedures today: none  Plan:  Continue routine obstetrical care  Next visit: prefers will be in person for u/s     Reviewed: Preterm labor symptoms and general obstetric precautions including but not limited to vaginal bleeding, contractions, leaking of fluid and fetal movement  were reviewed in detail with the patient.  All questions were answered. Does have home bp cuff. Office bp cuff given: not applicable. Check bp weekly, let us know if consistently >140 and/or >90.  Follow-up: Return in about 2 weeks (around 08/22/2023) for LROB, US:EFW, MD (to schedule c/s), in person.  No future appointments.  No orders of the defined types were placed in this encounter.  Cheral Marker CNM, [facility] 08/08/2023 9:31 AM

## 2023-08-08 NOTE — Patient Instructions (Signed)
Vernadine, thank you for choosing our office today! We appreciate the opportunity to meet your healthcare needs. You may receive a short survey by mail, e-mail, or through MyChart. If you are happy with your care we would appreciate if you could take just a few minutes to complete the survey questions. We read all of your comments and take your feedback very seriously. Thank you again for choosing our office.  [facility] Team at [facility] at  ([address] [place], [place] [address]) Entrance C, located off of [address] Free 24/7 valet parking   CLASSES: Go to [email or web address] to register for classes (childbirth, breastfeeding, waterbirth, infant CPR, daddy bootcamp, etc.)  Call the office ([phone]) or go to [facility] if: You begin to have strong, frequent contractions Your water breaks.  Sometimes it is a big gush of fluid, sometimes it is just a trickle that keeps getting your panties wet or running down your legs You have vaginal bleeding.  It is normal to have a small amount of spotting if your cervix was checked.  You don't feel your baby moving like normal.  If you don't, get you something to eat and drink and lay down and focus on feeling your baby move.   If your baby is still not moving like normal, you should call the office or go to [facility].  Call the office ([phone]) or go to [facility] for these signs of pre-eclampsia: Severe headache that does not go away with Tylenol Visual changes- seeing spots, double, blurred vision Pain under your right breast or upper abdomen that does not go away with Tums or heartburn medicine Nausea and/or vomiting Severe swelling in your hands, feet, and face   Tdap Vaccine It is recommended that you get the Tdap vaccine during the third trimester of EACH pregnancy to help protect your baby from getting pertussis (whooping cough) 27-36 weeks is the BEST time to do  this so that you can pass the protection on to your baby. During pregnancy is better than after pregnancy, but if you are unable to get it during pregnancy it will be offered at the hospital.  You can get this vaccine with us, at the health department, your family doctor, or some local pharmacies Everyone who will be around your baby should also be up-to-date on their vaccines before the baby comes. Adults (who are not pregnant) only need 1 dose of Tdap during adulthood.   [place] Pediatricians/[facility] ([facility]): [address], [phone]           [facility]: [address], [phone]                [facility] ([facility]): [address], [phone] (call to ask if accepting patients) [facility]: [address], [place], [phone]    [place] Pediatricians/Family Doctors [facility] ([facility]): [address], [phone] [facility]: [address], [phone] [facility] [place]: [address], [phone]  [place] [facility]  [facility] ([facility]): [phone] [facility]: [address], [phone]   [place] Family Doctors [facility]: [address], [phone]  [place] [facility]  [facility]: [phone], [phone]  Home Blood Pressure Monitoring for Patients   Your provider has recommended that you check your   blood pressure (BP) at least once a week at home. If you do not have a blood pressure cuff at home, one will be provided for you. Contact your provider if you have not received your monitor within 1 week.   Helpful Tips for Accurate Home Blood Pressure Checks  Don't smoke, exercise, or drink caffeine 30 minutes before checking your BP Use the restroom before checking your BP (a full bladder can raise your  pressure) Relax in a comfortable upright chair Feet on the ground Left arm resting comfortably on a flat surface at the level of your heart Legs uncrossed Back supported Sit quietly and don't talk Place the cuff on your bare arm Adjust snuggly, so that only two fingertips can fit between your skin and the top of the cuff Check 2 readings separated by at least one minute Keep a log of your BP readings For a visual, please reference this diagram: http://ccnc.care/bpdiagram  Provider Name: [facility]     Phone: [phone]  Zone 1: ALL CLEAR  Continue to monitor your symptoms:  BP reading is less than 140 (top number) or less than 90 (bottom number)  No right upper stomach pain No headaches or seeing spots No feeling nauseated or throwing up No swelling in face and hands  Zone 2: CAUTION Call your doctor's office for any of the following:  BP reading is greater than 140 (top number) or greater than 90 (bottom number)  Stomach pain under your ribs in the middle or right side Headaches or seeing spots Feeling nauseated or throwing up Swelling in face and hands  Zone 3: EMERGENCY  Seek immediate medical care if you have any of the following:  BP reading is greater than160 (top number) or greater than 110 (bottom number) Severe headaches not improving with Tylenol Serious difficulty catching your breath Any worsening symptoms from Zone 2  Preterm Labor and Birth Information  The normal length of a pregnancy is 39-41 weeks. Preterm labor is when labor starts before 37 completed weeks of pregnancy. What are the risk factors for preterm labor? Preterm labor is more likely to occur in women who: Have certain infections during pregnancy such as a bladder infection, sexually transmitted infection, or infection inside the uterus (chorioamnionitis). Have a shorter-than-normal cervix. Have gone into preterm labor before. Have had surgery on their cervix. Are younger than age 17  or older than age 35. Are African American. Are pregnant with twins or multiple babies (multiple gestation). Take street drugs or smoke while pregnant. Do not gain enough weight while pregnant. Became pregnant shortly after having been pregnant. What are the symptoms of preterm labor? Symptoms of preterm labor include: Cramps similar to those that can happen during a menstrual period. The cramps may happen with diarrhea. Pain in the abdomen or lower back. Regular uterine contractions that may feel like tightening of the abdomen. A feeling of increased pressure in the pelvis. Increased watery or bloody mucus discharge from the vagina. Water breaking (ruptured amniotic sac). Why is it important to recognize signs of preterm labor? It is important to recognize signs of preterm labor because babies who are born prematurely may not be fully developed. This can put them at an increased risk for: Long-term (chronic) heart and lung problems. Difficulty immediately after birth with regulating body systems, including blood sugar, body temperature, heart rate, and breathing rate. Bleeding in the brain. Cerebral palsy. Learning difficulties. Death. These risks are highest for babies who are born before 34 weeks   of pregnancy. How is preterm labor treated? Treatment depends on the length of your pregnancy, your condition, and the health of your baby. It may involve: Having a stitch (suture) placed in your cervix to prevent your cervix from opening too early (cerclage). Taking or being given medicines, such as: Hormone medicines. These may be given early in pregnancy to help support the pregnancy. Medicine to stop contractions. Medicines to help mature the baby's lungs. These may be prescribed if the risk of delivery is high. Medicines to prevent your baby from developing cerebral palsy. If the labor happens before 34 weeks of pregnancy, you may need to stay in the hospital. What should I do if I  think I am in preterm labor? If you think that you are going into preterm labor, call your health care provider right away. How can I prevent preterm labor in future pregnancies? To increase your chance of having a full-term pregnancy: Do not use any tobacco products, such as cigarettes, chewing tobacco, and e-cigarettes. If you need help quitting, ask your health care provider. Do not use street drugs or medicines that have not been prescribed to you during your pregnancy. Talk with your health care provider before taking any herbal supplements, even if you have been taking them regularly. Make sure you gain a healthy amount of weight during your pregnancy. Watch for infection. If you think that you might have an infection, get it checked right away. Make sure to tell your health care provider if you have gone into preterm labor before. This information is not intended to replace advice given to you by your health care provider. Make sure you discuss any questions you have with your health care provider. Document Revised: 09/14/2018 Document Reviewed: 10/14/2015 Elsevier Patient Education  2020 Elsevier Inc.   

## 2023-08-10 ENCOUNTER — Encounter: Payer: Self-pay | Admitting: Women's Health

## 2023-08-17 ENCOUNTER — Encounter (HOSPITAL_COMMUNITY): Payer: Self-pay | Admitting: Obstetrics and Gynecology

## 2023-08-17 ENCOUNTER — Other Ambulatory Visit: Payer: Self-pay

## 2023-08-17 ENCOUNTER — Inpatient Hospital Stay (HOSPITAL_COMMUNITY)
Admission: AD | Admit: 2023-08-17 | Discharge: 2023-08-18 | Disposition: A | Discharge status: Home / Self Care | Attending: Obstetrics and Gynecology | Admitting: Obstetrics and Gynecology

## 2023-08-17 DIAGNOSIS — R519 Headache, unspecified: Secondary | ICD-10-CM

## 2023-08-17 DIAGNOSIS — Z3A33 33 weeks gestation of pregnancy: Secondary | ICD-10-CM | POA: Insufficient documentation

## 2023-08-17 DIAGNOSIS — R600 Localized edema: Secondary | ICD-10-CM

## 2023-08-17 DIAGNOSIS — O99353 Diseases of the nervous system complicating pregnancy, third trimester: Secondary | ICD-10-CM | POA: Diagnosis not present

## 2023-08-17 DIAGNOSIS — O1203 Gestational edema, third trimester: Secondary | ICD-10-CM | POA: Diagnosis not present

## 2023-08-17 DIAGNOSIS — G43909 Migraine, unspecified, not intractable, without status migrainosus: Secondary | ICD-10-CM | POA: Insufficient documentation

## 2023-08-17 DIAGNOSIS — O26893 Other specified pregnancy related conditions, third trimester: Secondary | ICD-10-CM | POA: Diagnosis not present

## 2023-08-17 LAB — COMPREHENSIVE METABOLIC PANEL
ALT: 24 U/L (ref 0–44)
AST: 22 U/L (ref 15–41)
Albumin: 2.7 g/dL — ABNORMAL LOW (ref 3.5–5.0)
Alkaline Phosphatase: 121 U/L (ref 38–126)
Anion gap: 8 (ref 5–15)
BUN: 7 mg/dL (ref 6–20)
CO2: 23 mmol/L (ref 22–32)
Calcium: 9.1 mg/dL (ref 8.9–10.3)
Chloride: 105 mmol/L (ref 98–111)
Creatinine, Ser: 0.48 mg/dL (ref 0.44–1.00)
GFR, Estimated: >60 mL/min (ref >60)
Glucose, Bld: 89 mg/dL (ref 70–99)
Potassium: 3.9 mmol/L (ref 3.5–5.1)
Sodium: 136 mmol/L (ref 135–145)
Total Bilirubin: 0.2 mg/dL (ref 0.0–1.2)
Total Protein: 6.7 g/dL (ref 6.5–8.1)

## 2023-08-17 LAB — CBC
HCT: 33 % — ABNORMAL LOW (ref 36.0–46.0)
Hemoglobin: 10.5 g/dL — ABNORMAL LOW (ref 12.0–15.0)
MCH: 26.6 pg (ref 26.0–34.0)
MCHC: 31.8 g/dL (ref 30.0–36.0)
MCV: 83.8 fL (ref 80.0–100.0)
Platelets: 226 10*3/uL (ref 150–400)
RBC: 3.94 MIL/uL (ref 3.87–5.11)
RDW: 14.7 % (ref 11.5–15.5)
WBC: 8.5 10*3/uL (ref 4.0–10.5)
nRBC: 0 % (ref 0.0–0.2)

## 2023-08-17 LAB — PROTEIN / CREATININE RATIO, URINE
Creatinine, Urine: 194 mg/dL
Protein Creatinine Ratio: 0.13 mg/mg{creat} (ref 0.00–0.15)
Total Protein, Urine: 26 mg/dL

## 2023-08-17 MED ORDER — ACETAMINOPHEN-CAFFEINE 500-65 MG PO TABS
2.0000 | ORAL_TABLET | Freq: Once | ORAL | Status: AC
  Administered 2023-08-17: 2 via ORAL
  Filled 2023-08-17: qty 2

## 2023-08-17 MED ORDER — EXCEDRIN TENSION HEADACHE 500-65 MG PO TABS: 2.0000 | ORAL_TABLET | Freq: Four times a day (QID) | ORAL | 0 refills | Status: DC | PRN

## 2023-08-17 NOTE — MAU Note (Signed)
.  Yvonne Stone is a 33 y.o. at [redacted]w[redacted]d here in MAU reporting: HA and swollen feet, NO c/o SROM, vaginal bleeding, contractions, decreased FM, chest pain, or visual disturbances. EFM explained and applied to soft non tender abd. Physical begun.   LMP: na Onset of complaint: 6 days ago for HA and 7 days for swelling Pain score: 6/10 There were no vitals filed for this visit.    Lab orders placed from triage: NA

## 2023-08-17 NOTE — MAU Provider Note (Signed)
 Chief Complaint:  Headache and Foot Swelling   Event Date/Time   First Provider Initiated Contact with Patient 08/17/23 2215     HPI: Yvonne Stone is a 33 y.o. [id number] at 7w1dwho presents to maternity admissions reporting new swelling of feet and legs.  Also has a headache which did not get better with Tylenol.  Has a history of migraines. . She reports good fetal movement, denies LOF, vaginal bleeding, dizziness, n/v, diarrhea, constipation or fever/chills.    Headache  This is a new problem. The current episode started today. The problem occurs constantly. The quality of the pain is described as aching. Pertinent negatives include no abdominal pain, back pain, eye pain, muscle aches, nausea or weakness. She has tried acetaminophen for the symptoms. The treatment provided no relief.   RN Note: Yvonne Stone is a 33 y.o. at [redacted]w[redacted]d here in MAU reporting: HA and swollen feet, NO c/o SROM, vaginal bleeding, contractions, decreased FM, chest pain, or visual disturbances. EFM explained and applied to soft non tender abd. Physical begun.   Past Medical History: Past Medical History:  Diagnosis Date   Depression    Family history of adverse reaction to anesthesia    mom and unsure what   Herpes    genital   History of cesarean section 12/16/2015   Mental disorder    postpartum depression   Migraines    UTI (urinary tract infection) 12/10/2018   + Ecoli, tx'd 7/6 with septra ds    Vaginal Pap smear, abnormal     Past obstetric history: OB History  Gravida Para Term Preterm AB Living  5 3 3  1 3   SAB IAB Ectopic Multiple Live Births  1   0 3    # Outcome Date GA Lbr Len/2nd Weight Sex Type Anes PTL Lv  5 Current           4 Term 06/16/22 [redacted]w[redacted]d  2722 g M CS-LTranv Spinal N LIV  3 Term 07/15/16 [redacted]w[redacted]d  3255 g M CS-LTranv Spinal  LIV  2 Term 02/22/14 [redacted]w[redacted]d  3005 g M CS-LTranv  N LIV     Complications: Fetal Intolerance  1 SAB             Past Surgical History: Past Surgical  History:  Procedure Laterality Date   CESAREAN SECTION  06/16/2022   CESAREAN SECTION N/A 07/15/2016   Procedure: REPEAT CESAREAN SECTION;  Surgeon: Tilda Burrow, MD;  Location: [facility];  Service: Obstetrics;  Laterality: N/A;   CESAREAN SECTION  02/22/2014    Family History: Family History  Problem Relation Age of Onset   Heart murmur Mother    Hypertension Mother    Diabetes Mother    Thyroid disease Mother    Hypertension Father    Urticaria Father    Asthma Father    Heart murmur Sister    Urticaria Sister    Heart murmur Sister    Thyroid disease Maternal Aunt    Hypertension Maternal Aunt    Diabetes Maternal Aunt    Diabetes Maternal Uncle    Hypertension Maternal Uncle    Diabetes Paternal Aunt    Hypertension Paternal Aunt    Hypertension Paternal Uncle    Diabetes Paternal Uncle    Cancer Maternal Grandmother        breast    Hypertension Maternal Grandmother    Hypertension Maternal Grandfather    Seizures Paternal Grandmother    Stroke Paternal Grandmother  Hypertension Paternal Grandmother    Diabetes Paternal Grandfather    Hypertension Paternal Grandfather    Angioedema Neg Hx    Allergic rhinitis Neg Hx    Atopy Neg Hx    Eczema Neg Hx    Immunodeficiency Neg Hx     Social History: Social History   Tobacco Use   Smoking status: Never   Smokeless tobacco: Never  Vaping Use   Vaping status: Never Used  Substance Use Topics   Alcohol use: Not Currently    Comment: occ   Drug use: No    Allergies: No Known Allergies  Meds:  Medications Prior to Admission  Medication Sig Dispense Refill Last Dose/Taking   Prenatal Vit-Fe Fumarate-FA (PRENATAL VITAMIN PO) Take by mouth.       I have reviewed patient's Past Medical Hx, Surgical Hx, Family Hx, Social Hx, medications and allergies.   ROS:  Review of Systems  Eyes:  Negative for pain.  Gastrointestinal:  Negative for abdominal pain and nausea.  Musculoskeletal:  Negative  for back pain.  Neurological:  Positive for headaches. Negative for weakness.   Other systems negative  Physical Exam  No data found. Vitals:   08/17/23 2218 08/17/23 2230 08/17/23 2245 08/17/23 2300  BP: 131/73 125/81 127/71 130/74   08/17/23 2315 08/17/23 2330 08/17/23 2345 08/18/23 0000  BP: 126/73 124/70 134/77 128/79    Constitutional: Well-developed, well-nourished female in no acute distress.  Cardiovascular: normal rate and rhythm Respiratory: normal effort GI: Abd soft, non-tender, gravid appropriate for gestational age.   No rebound or guarding. MS: Extremities nontender, no edema, normal ROM Neurologic: Alert and oriented x 4.   FHT:  Baseline 135 , moderate variability, accelerations present, no decelerations Contractions:   Rare   Labs: Results for orders placed or performed during the hospital encounter of 08/17/23 (from the past 24 hours)  Protein / creatinine ratio, urine     Status: None   Collection Time: 08/17/23 10:29 PM  Result Value Ref Range   Creatinine, Urine 194 mg/dL   Total Protein, Urine 26 mg/dL   Protein Creatinine Ratio 0.13 0.00 - 0.15 mg/mg[Cre]  CBC     Status: Abnormal   Collection Time: 08/17/23 10:40 PM  Result Value Ref Range   WBC 8.5 4.0 - 10.5 K/uL   RBC 3.94 3.87 - 5.11 MIL/uL   Hemoglobin 10.5 (L) 12.0 - 15.0 g/dL   HCT 13.0 (L) 86.5 - 78.4 %   MCV 83.8 80.0 - 100.0 fL   MCH 26.6 26.0 - 34.0 pg   MCHC 31.8 30.0 - 36.0 g/dL   RDW 69.6 29.5 - 28.4 %   Platelets 226 150 - 400 K/uL   nRBC 0.0 0.0 - 0.2 %  Comprehensive metabolic panel     Status: Abnormal   Collection Time: 08/17/23 10:40 PM  Result Value Ref Range   Sodium 136 135 - 145 mmol/L   Potassium 3.9 3.5 - 5.1 mmol/L   Chloride 105 98 - 111 mmol/L   CO2 23 22 - 32 mmol/L   Glucose, Bld 89 70 - 99 mg/dL   BUN 7 6 - 20 mg/dL   Creatinine, Ser 1.32 0.44 - 1.00 mg/dL   Calcium 9.1 8.9 - 44.0 mg/dL   Total Protein 6.7 6.5 - 8.1 g/dL   Albumin 2.7 (L) 3.5 - 5.0 g/dL    AST 22 15 - 41 U/L   ALT 24 0 - 44 U/L   Alkaline Phosphatase 121 38 - 126  U/L   Total Bilirubin 0.2 0.0 - 1.2 mg/dL   GFR, Estimated >16 >10 mL/min   Anion gap 8 5 - 15       Imaging:  No results found.  MAU Course/MDM: I have reviewed the triage vital signs and the nursing notes.   Pertinent labs & imaging results that were available during my care of the patient were reviewed by me and considered in my medical decision making (see chart for details).      I have reviewed her medical records including past results, notes and treatments.   I have ordered labs and reviewed results. Ran preeclampsia labs as baseline since the edema is new.  Patient is not hypertensive.  Discussed Gestational hypertension.  NST reviewed  Treatments in MAU included Excedrin Tension which did provide relief from headache.    Assessment: [place] IUP at [redacted]w[redacted]d Migraine headache New lower extremity edema   Plan: Discharge home Elevate legs and wear compression hose Rx Excedrin Tension prn migraines May need referral for migraine management Preterm Labor precautions and fetal kick counts Follow up in Office for prenatal visits and recheck Encouraged to return if she develops worsening of symptoms, increase in pain, fever, or other concerning symptoms.   Pt stable at time of discharge.  Wynelle Bourgeois CNM, MSN Certified Nurse-Midwife 08/17/2023 10:15 PM

## 2023-08-18 ENCOUNTER — Other Ambulatory Visit: Payer: Self-pay | Admitting: Advanced Practice Midwife

## 2023-08-18 DIAGNOSIS — O26893 Other specified pregnancy related conditions, third trimester: Secondary | ICD-10-CM

## 2023-08-18 DIAGNOSIS — R600 Localized edema: Secondary | ICD-10-CM

## 2023-08-18 DIAGNOSIS — Z3A33 33 weeks gestation of pregnancy: Secondary | ICD-10-CM

## 2023-08-18 DIAGNOSIS — R519 Headache, unspecified: Secondary | ICD-10-CM

## 2023-08-18 MED ORDER — BUTALBITAL-APAP-CAFFEINE 50-325-40 MG PO TABS: 1.0000 | ORAL_TABLET | Freq: Four times a day (QID) | ORAL | 0 refills | Status: DC | PRN

## 2023-08-24 ENCOUNTER — Ambulatory Visit

## 2023-08-24 ENCOUNTER — Ambulatory Visit: Admitting: Obstetrics & Gynecology

## 2023-08-24 ENCOUNTER — Encounter: Payer: Self-pay | Admitting: Obstetrics & Gynecology

## 2023-08-24 VITALS — BP 106/70 | HR 76 | Wt 249.0 lb

## 2023-08-24 DIAGNOSIS — Z3483 Encounter for supervision of other normal pregnancy, third trimester: Secondary | ICD-10-CM

## 2023-08-24 DIAGNOSIS — Z3A34 34 weeks gestation of pregnancy: Secondary | ICD-10-CM | POA: Diagnosis not present

## 2023-08-24 DIAGNOSIS — Z3480 Encounter for supervision of other normal pregnancy, unspecified trimester: Secondary | ICD-10-CM

## 2023-08-24 DIAGNOSIS — D369 Benign neoplasm, unspecified site: Secondary | ICD-10-CM

## 2023-08-24 DIAGNOSIS — N83201 Unspecified ovarian cyst, right side: Secondary | ICD-10-CM | POA: Diagnosis not present

## 2023-08-24 DIAGNOSIS — Z98891 History of uterine scar from previous surgery: Secondary | ICD-10-CM | POA: Diagnosis not present

## 2023-08-24 NOTE — Progress Notes (Signed)
 [place] 34+1 wks,cephalic,fundal placenta gr 2,AFI 15 cm,right adnexal echogenic mass with solid and cyst components (? dermoid) 9.5 x 5.5 x 5 cm,normal left ovary,FHR 137 cm,EFW 2285 g 35%

## 2023-08-24 NOTE — Progress Notes (Signed)
   LOW-RISK PREGNANCY VISIT Patient name: Yvonne Stone MRN [id number]  Date of birth: March 10, 1991 Chief Complaint:   Routine Prenatal Visit  History of Present Illness:   Yvonne Stone is a 33 y.o. [id number] female at [redacted]w[redacted]d with an Estimated Date of Delivery: 10/04/23 being seen today for ongoing management of a low-risk pregnancy.     03/27/2023   11:07 AM 08/09/2022    2:31 PM 12/27/2021    2:46 PM 12/16/2016   11:50 AM 12/16/2016   11:49 AM  Depression screen PHQ 2/9  Decreased Interest 3 2 2 1 1   Down, Depressed, Hopeless 3 1 2 1 1   PHQ - 2 Score 6 3 4 2 2   Altered sleeping 3 3 2 2    Tired, decreased energy 3 3 2 2    Change in appetite 3 0 2 2   Feeling bad or failure about yourself  3 1 2 1    Trouble concentrating 3 0 2 1   Moving slowly or fidgety/restless 1 0 0 1   Suicidal thoughts 0 0 0 0   PHQ-9 Score 22 10 14 11      Today she reports no complaints. Contractions: Not present. Vag. Bleeding: None.  Movement: Present. denies leaking of fluid. Review of Systems:   Pertinent items are noted in HPI Denies abnormal vaginal discharge w/ itching/odor/irritation, headaches, visual changes, shortness of breath, chest pain, abdominal pain, severe nausea/vomiting, or problems with urination or bowel movements unless otherwise stated above. Pertinent History Reviewed:  Reviewed past medical,surgical, social, obstetrical and family history.  Reviewed problem list, medications and allergies. Physical Assessment:   Vitals:   08/24/23 1100  BP: 106/70  Pulse: 76  Weight: 249 lb (112.9 kg)  Body mass index is 41.44 kg/m.        Physical Examination:   General appearance: Well appearing, and in no distress  Mental status: Alert, oriented to person, place, and time  Skin: Warm & dry  Cardiovascular: Normal heart rate noted  Respiratory: Normal respiratory effort, no distress  Abdomen: Soft, gravid, nontender  Pelvic: Cervical exam deferred         Extremities:    Fetal  Status:     Movement: Present    Chaperone: n/a    No results found for this or any previous visit (from the past 24 hours).  Assessment & Plan:  1) Low-risk pregnancy [id number] at [redacted]w[redacted]d with an Estimated Date of Delivery: 10/04/23   2) sonogram EFW 35%, dermoid stable,    Meds: No orders of the defined types were placed in this encounter.  Labs/procedures today: sonogram  Plan:  Continue routine obstetrical care  Next visit: prefers in person      Follow-up: No follow-ups on file.  No orders of the defined types were placed in this encounter.   Lazaro Arms, MD 08/24/2023 11:06 AM

## 2023-08-27 ENCOUNTER — Encounter (HOSPITAL_COMMUNITY): Payer: Self-pay | Admitting: Obstetrics & Gynecology

## 2023-08-27 ENCOUNTER — Inpatient Hospital Stay (HOSPITAL_COMMUNITY)
Admission: AD | Admit: 2023-08-27 | Discharge: 2023-08-27 | Disposition: A | Discharge status: Home / Self Care | Attending: Obstetrics & Gynecology | Admitting: Obstetrics & Gynecology

## 2023-08-27 ENCOUNTER — Other Ambulatory Visit: Payer: Self-pay

## 2023-08-27 DIAGNOSIS — O479 False labor, unspecified: Secondary | ICD-10-CM | POA: Diagnosis not present

## 2023-08-27 DIAGNOSIS — N858 Other specified noninflammatory disorders of uterus: Secondary | ICD-10-CM | POA: Insufficient documentation

## 2023-08-27 DIAGNOSIS — O4703 False labor before 37 completed weeks of gestation, third trimester: Secondary | ICD-10-CM | POA: Insufficient documentation

## 2023-08-27 DIAGNOSIS — Z3A34 34 weeks gestation of pregnancy: Secondary | ICD-10-CM | POA: Diagnosis not present

## 2023-08-27 DIAGNOSIS — Z3493 Encounter for supervision of normal pregnancy, unspecified, third trimester: Secondary | ICD-10-CM

## 2023-08-27 DIAGNOSIS — Z3689 Encounter for other specified antenatal screening: Secondary | ICD-10-CM

## 2023-08-27 DIAGNOSIS — O34211 Maternal care for low transverse scar from previous cesarean delivery: Secondary | ICD-10-CM | POA: Diagnosis not present

## 2023-08-27 LAB — WET PREP, GENITAL
Clue Cells Wet Prep HPF POC: NONE SEEN
Sperm: NONE SEEN
Trich, Wet Prep: NONE SEEN
WBC, Wet Prep HPF POC: >=10 — AB (ref <10)
Yeast Wet Prep HPF POC: NONE SEEN

## 2023-08-27 NOTE — MAU Note (Signed)
.  Yvonne Stone is a 33 y.o. at [redacted]w[redacted]d here in MAU reporting: called nurse line and was told to come in. On and off ctx, but not consistent. States she was in the shower tonight and had some dark brown mucous like discharge come out. Denies VB or LOF. +FM   Onset of complaint: 2300 Pain score: 7 - ctx  Vitals:   08/27/23 0101  BP: 125/73  Pulse: 80  Resp: 18  Temp: 97.8 F (36.6 C)  SpO2: 100%     FHT: 138  Lab orders placed from triage: none

## 2023-08-27 NOTE — MAU Provider Note (Signed)
 Chief Complaint:  Vaginal Discharge   HPI   None     Yvonne Stone is a 33 y.o. [id number] at [redacted]w[redacted]d who presents to maternity admissions reporting she was in the shower tonight and saw some brown discharge. She was concerned it could be her mucus plug, called the triage line and was told to come in. She denies intercourse in the past week, LOF or VB. She endorses regular and vigorous fetal movement and irregular contractions. She reports a history of recurrent and frequent bacterial vaginosis.   Pregnancy Course: HSV, Dep/Anxiety  Past Medical History:  Diagnosis Date   Depression    Family history of adverse reaction to anesthesia    mom and unsure what   Herpes    genital   History of cesarean section 12/16/2015   Mental disorder    postpartum depression   Migraines    UTI (urinary tract infection) 12/10/2018   + Ecoli, tx'd 7/6 with septra ds    Vaginal Pap smear, abnormal    OB History  Gravida Para Term Preterm AB Living  5 3 3  1 3   SAB IAB Ectopic Multiple Live Births  1   0 3    # Outcome Date GA Lbr Len/2nd Weight Sex Type Anes PTL Lv  5 Current           4 Term 06/16/22 [redacted]w[redacted]d  2722 g M CS-LTranv Spinal N LIV  3 Term 07/15/16 [redacted]w[redacted]d  3255 g M CS-LTranv Spinal  LIV  2 Term 02/22/14 [redacted]w[redacted]d  3005 g M CS-LTranv  N LIV     Complications: Fetal Intolerance  1 SAB            Past Surgical History:  Procedure Laterality Date   CESAREAN SECTION  06/16/2022   CESAREAN SECTION N/A 07/15/2016   Procedure: REPEAT CESAREAN SECTION;  Surgeon: Tilda Burrow, MD;  Location: [facility];  Service: Obstetrics;  Laterality: N/A;   CESAREAN SECTION  02/22/2014   Family History  Problem Relation Age of Onset   Heart murmur Mother    Hypertension Mother    Diabetes Mother    Thyroid disease Mother    Hypertension Father    Urticaria Father    Asthma Father    Heart murmur Sister    Urticaria Sister    Heart murmur Sister    Thyroid disease Maternal Aunt     Hypertension Maternal Aunt    Diabetes Maternal Aunt    Diabetes Maternal Uncle    Hypertension Maternal Uncle    Diabetes Paternal Aunt    Hypertension Paternal Aunt    Hypertension Paternal Uncle    Diabetes Paternal Uncle    Cancer Maternal Grandmother        breast    Hypertension Maternal Grandmother    Hypertension Maternal Grandfather    Seizures Paternal Grandmother    Stroke Paternal Grandmother    Hypertension Paternal Grandmother    Diabetes Paternal Grandfather    Hypertension Paternal Grandfather    Angioedema Neg Hx    Allergic rhinitis Neg Hx    Atopy Neg Hx    Eczema Neg Hx    Immunodeficiency Neg Hx    Social History   Tobacco Use   Smoking status: Never   Smokeless tobacco: Never  Vaping Use   Vaping status: Never Used  Substance Use Topics   Alcohol use: Not Currently    Comment: occ   Drug use: No   No Known Allergies  Medications Prior to Admission  Medication Sig Dispense Refill Last Dose/Taking   acetaminophen-caffeine (EXCEDRIN TENSION HEADACHE) 500-65 MG TABS per tablet Take 2 tablets by mouth every 6 (six) hours as needed. 60 tablet 0 Past Month   butalbital-acetaminophen-caffeine (FIORICET) 50-325-40 MG tablet Take 1-2 tablets by mouth every 6 (six) hours as needed for headache. 20 tablet 0 Unknown   Prenatal Vit-Fe Fumarate-FA (PRENATAL VITAMIN PO) Take by mouth.   08/27/2023 Morning    I have reviewed patient's Past Medical Hx, Surgical Hx, Family Hx, Social Hx, medications and allergies.   ROS  Pertinent items noted in HPI and remainder of comprehensive ROS otherwise negative.   PHYSICAL EXAM  Patient Vitals for the past 24 hrs:  BP Temp Temp src Pulse Resp SpO2 Height Weight  08/27/23 0117 122/70 -- -- 78 -- 99 % -- --  08/27/23 0101 125/73 97.8 F (36.6 C) Oral 80 18 100 % 5\' 5"  (1.651 m) 112.6 kg    Physical Exam Vitals and nursing note reviewed.  Constitutional:      General: She is not in acute distress.    Appearance:  Normal appearance. She is not ill-appearing, toxic-appearing or diaphoretic.  Cardiovascular:     Rate and Rhythm: Normal rate.  Pulmonary:     Effort: Pulmonary effort is normal.     Breath sounds: Normal breath sounds.  Skin:    General: Skin is warm and dry.     Capillary Refill: Capillary refill takes less than 2 seconds.  Neurological:     General: No focal deficit present.     Mental Status: She is alert and oriented to person, place, and time.  Psychiatric:        Mood and Affect: Mood normal.        Behavior: Behavior normal.        Thought Content: Thought content normal.        Judgment: Judgment normal.         Fetal Tracing: Baseline: 155 Variability:moderate Accelerations: 15x15 Decelerations:none Toco: 3-10   Labs: Results for orders placed or performed during the hospital encounter of 08/27/23 (from the past 24 hours)  Wet prep, genital     Status: Abnormal   Collection Time: 08/27/23  1:34 AM   Specimen: Vaginal  Result Value Ref Range   Yeast Wet Prep HPF POC NONE SEEN NONE SEEN   Trich, Wet Prep NONE SEEN NONE SEEN   Clue Cells Wet Prep HPF POC NONE SEEN NONE SEEN   WBC, Wet Prep HPF POC >=10 (A) <10   Sperm NONE SEEN     Imaging:  No results found.  MDM & MAU COURSE  MDM: Fetal and uterine monitoring. Wet prep and GC. Patient report of irregular, infrequent contractions unlikely to be labor.  MAU Course: Orders Placed This Encounter  Procedures   Wet prep, genital   Discharge patient Discharge disposition: 01-Home or Self Care; Discharge patient date: 08/27/2023   No orders of the defined types were placed in this encounter.   ASSESSMENT   1. Braxton Hick's contraction   2. Movement of fetus present during pregnancy in third trimester   3. [redacted] weeks gestation of pregnancy   4. NST (non-stress test) reactive    Wet prep negative.  Patient without signs of labor. Patient reports good fetal movement.   PLAN  Discharge home in  stable condition.  Return precautions advised.      Allergies as of 08/27/2023   No Known Allergies  Medication List     TAKE these medications    butalbital-acetaminophen-caffeine 50-325-40 MG tablet Commonly known as: FIORICET Take 1-2 tablets by mouth every 6 (six) hours as needed for headache.   Excedrin Tension Headache 500-65 MG Tabs per tablet Generic drug: acetaminophen-caffeine Take 2 tablets by mouth every 6 (six) hours as needed.   PRENATAL VITAMIN PO Take by mouth.        Lamont Snowball, MSN, CNM 08/27/2023 2:03 AM  Certified Nurse Midwife, [facility]

## 2023-08-28 ENCOUNTER — Encounter: Payer: Self-pay | Admitting: Women's Health

## 2023-08-28 LAB — GC/CHLAMYDIA PROBE AMP (~~LOC~~) NOT AT ARMC
Chlamydia: NEGATIVE
Comment: NEGATIVE
Comment: NORMAL
Neisseria Gonorrhea: NEGATIVE

## 2023-09-03 ENCOUNTER — Inpatient Hospital Stay (HOSPITAL_COMMUNITY): Admitting: Anesthesiology

## 2023-09-03 ENCOUNTER — Inpatient Hospital Stay (HOSPITAL_COMMUNITY)
Admission: AD | Admit: 2023-09-03 | Discharge: 2023-09-06 | DRG: 783 | Disposition: A | Discharge status: Home / Self Care | Attending: Obstetrics & Gynecology | Admitting: Obstetrics & Gynecology

## 2023-09-03 ENCOUNTER — Encounter (HOSPITAL_COMMUNITY)
Admission: AD | Disposition: A | Discharge status: Home / Self Care | Payer: Self-pay | Source: Home / Self Care | Attending: Obstetrics & Gynecology

## 2023-09-03 ENCOUNTER — Encounter (HOSPITAL_COMMUNITY): Payer: Self-pay | Admitting: Obstetrics & Gynecology

## 2023-09-03 ENCOUNTER — Other Ambulatory Visit: Payer: Self-pay

## 2023-09-03 DIAGNOSIS — O99892 Other specified diseases and conditions complicating childbirth: Secondary | ICD-10-CM | POA: Diagnosis present

## 2023-09-03 DIAGNOSIS — E66813 Obesity, class 3: Secondary | ICD-10-CM | POA: Diagnosis present

## 2023-09-03 DIAGNOSIS — O9832 Other infections with a predominantly sexual mode of transmission complicating childbirth: Secondary | ICD-10-CM

## 2023-09-03 DIAGNOSIS — O99214 Obesity complicating childbirth: Secondary | ICD-10-CM | POA: Diagnosis present

## 2023-09-03 DIAGNOSIS — Z3A00 Weeks of gestation of pregnancy not specified: Secondary | ICD-10-CM

## 2023-09-03 DIAGNOSIS — A6000 Herpesviral infection of urogenital system, unspecified: Secondary | ICD-10-CM | POA: Diagnosis present

## 2023-09-03 DIAGNOSIS — Z302 Encounter for sterilization: Secondary | ICD-10-CM | POA: Diagnosis not present

## 2023-09-03 DIAGNOSIS — Z8249 Family history of ischemic heart disease and other diseases of the circulatory system: Secondary | ICD-10-CM

## 2023-09-03 DIAGNOSIS — O34219 Maternal care for unspecified type scar from previous cesarean delivery: Secondary | ICD-10-CM

## 2023-09-03 DIAGNOSIS — O34211 Maternal care for low transverse scar from previous cesarean delivery: Secondary | ICD-10-CM

## 2023-09-03 DIAGNOSIS — D270 Benign neoplasm of right ovary: Secondary | ICD-10-CM | POA: Diagnosis present

## 2023-09-03 DIAGNOSIS — Z9079 Acquired absence of other genital organ(s): Secondary | ICD-10-CM

## 2023-09-03 DIAGNOSIS — O99824 Streptococcus B carrier state complicating childbirth: Secondary | ICD-10-CM | POA: Diagnosis present

## 2023-09-03 DIAGNOSIS — Z833 Family history of diabetes mellitus: Secondary | ICD-10-CM | POA: Diagnosis not present

## 2023-09-03 DIAGNOSIS — Z98891 History of uterine scar from previous surgery: Principal | ICD-10-CM

## 2023-09-03 DIAGNOSIS — Z3A35 35 weeks gestation of pregnancy: Secondary | ICD-10-CM

## 2023-09-03 HISTORY — PX: TUBAL LIGATION: SHX77

## 2023-09-03 LAB — CBC
HCT: 34.3 % — ABNORMAL LOW (ref 36.0–46.0)
Hemoglobin: 11.2 g/dL — ABNORMAL LOW (ref 12.0–15.0)
MCH: 27.2 pg (ref 26.0–34.0)
MCHC: 32.7 g/dL (ref 30.0–36.0)
MCV: 83.3 fL (ref 80.0–100.0)
Platelets: 254 10*3/uL (ref 150–400)
RBC: 4.12 MIL/uL (ref 3.87–5.11)
RDW: 14.7 % (ref 11.5–15.5)
WBC: 8.5 10*3/uL (ref 4.0–10.5)
nRBC: 0 % (ref 0.0–0.2)

## 2023-09-03 LAB — PREPARE RBC (CROSSMATCH)

## 2023-09-03 SURGERY — Surgical Case
Anesthesia: Regional

## 2023-09-03 MED ORDER — ACETAMINOPHEN 500 MG PO TABS: 1000.0000 mg | ORAL_TABLET | Freq: Four times a day (QID) | ORAL | Status: DC

## 2023-09-03 MED ORDER — SODIUM CHLORIDE 0.9% FLUSH: 3.0000 mL | Freq: Two times a day (BID) | INTRAVENOUS | Status: DC

## 2023-09-03 MED ORDER — NALOXONE HCL 4 MG/10ML IJ SOLN: 1.0000 ug/kg/h | INTRAVENOUS | Status: DC | PRN

## 2023-09-03 MED ORDER — ONDANSETRON HCL 4 MG/2ML IJ SOLN
INTRAMUSCULAR | Status: DC | PRN
  Administered 2023-09-03: 4 mg via INTRAVENOUS

## 2023-09-03 MED ORDER — SENNOSIDES-DOCUSATE SODIUM 8.6-50 MG PO TABS
2.0000 | ORAL_TABLET | Freq: Every day | ORAL | Status: DC
  Administered 2023-09-04 – 2023-09-06 (×3): 2 via ORAL
  Filled 2023-09-03 (×3): qty 2

## 2023-09-03 MED ORDER — ONDANSETRON HCL 4 MG/2ML IJ SOLN: 4.0000 mg | Freq: Three times a day (TID) | INTRAMUSCULAR | Status: DC | PRN

## 2023-09-03 MED ORDER — FENTANYL CITRATE (PF) 100 MCG/2ML IJ SOLN: 25.0000 ug | INTRAMUSCULAR | Status: DC | PRN

## 2023-09-03 MED ORDER — SODIUM CHLORIDE 0.9 % IV SOLN
5.0000 10*6.[IU] | Freq: Once | INTRAVENOUS | Status: AC
  Administered 2023-09-03: 5 10*6.[IU] via INTRAVENOUS
  Filled 2023-09-03: qty 5

## 2023-09-03 MED ORDER — SIMETHICONE 80 MG PO CHEW
80.0000 mg | CHEWABLE_TABLET | Freq: Three times a day (TID) | ORAL | Status: DC
  Administered 2023-09-04 – 2023-09-06 (×7): 80 mg via ORAL
  Filled 2023-09-03 (×7): qty 1

## 2023-09-03 MED ORDER — SODIUM CHLORIDE 0.9 % IR SOLN
Status: DC | PRN
  Administered 2023-09-03: 1

## 2023-09-03 MED ORDER — IBUPROFEN 600 MG PO TABS
600.0000 mg | ORAL_TABLET | Freq: Four times a day (QID) | ORAL | Status: DC
  Administered 2023-09-04 – 2023-09-06 (×9): 600 mg via ORAL
  Filled 2023-09-03 (×9): qty 1

## 2023-09-03 MED ORDER — MAGNESIUM HYDROXIDE 400 MG/5ML PO SUSP: 30.0000 mL | ORAL | Status: DC | PRN

## 2023-09-03 MED ORDER — DROPERIDOL 2.5 MG/ML IJ SOLN: 0.6250 mg | Freq: Once | INTRAMUSCULAR | Status: DC | PRN

## 2023-09-03 MED ORDER — TRANEXAMIC ACID-NACL 1000-0.7 MG/100ML-% IV SOLN
INTRAVENOUS | Status: AC
  Filled 2023-09-03: qty 100

## 2023-09-03 MED ORDER — FENTANYL CITRATE (PF) 100 MCG/2ML IJ SOLN
INTRAMUSCULAR | Status: DC | PRN
  Administered 2023-09-03: 15 ug via INTRATHECAL

## 2023-09-03 MED ORDER — KETOROLAC TROMETHAMINE 30 MG/ML IJ SOLN
30.0000 mg | Freq: Once | INTRAMUSCULAR | Status: AC
  Administered 2023-09-03: 30 mg via INTRAVENOUS

## 2023-09-03 MED ORDER — SOD CITRATE-CITRIC ACID 500-334 MG/5ML PO SOLN
30.0000 mL | ORAL | Status: AC
  Administered 2023-09-03: 30 mL via ORAL

## 2023-09-03 MED ORDER — OXYCODONE HCL 5 MG PO TABS
5.0000 mg | ORAL_TABLET | Freq: Four times a day (QID) | ORAL | Status: DC | PRN
  Administered 2023-09-04 – 2023-09-06 (×2): 5 mg via ORAL
  Filled 2023-09-03 (×2): qty 1

## 2023-09-03 MED ORDER — MENTHOL 3 MG MT LOZG: 1.0000 | LOZENGE | OROMUCOSAL | Status: DC | PRN

## 2023-09-03 MED ORDER — COCONUT OIL OIL: 1.0000 | TOPICAL_OIL | Status: DC | PRN

## 2023-09-03 MED ORDER — GABAPENTIN 100 MG PO CAPS
300.0000 mg | ORAL_CAPSULE | Freq: Three times a day (TID) | ORAL | Status: DC
  Administered 2023-09-03 – 2023-09-06 (×8): 300 mg via ORAL
  Filled 2023-09-03 (×8): qty 3

## 2023-09-03 MED ORDER — OXYTOCIN-SODIUM CHLORIDE 30-0.9 UT/500ML-% IV SOLN
2.5000 [IU]/h | INTRAVENOUS | Status: AC
  Administered 2023-09-03: 2.5 [IU]/h via INTRAVENOUS
  Filled 2023-09-03: qty 500

## 2023-09-03 MED ORDER — SODIUM CHLORIDE 0.9% FLUSH: 3.0000 mL | INTRAVENOUS | Status: DC | PRN

## 2023-09-03 MED ORDER — MORPHINE SULFATE (PF) 0.5 MG/ML IJ SOLN
INTRAMUSCULAR | Status: AC
  Filled 2023-09-03: qty 10

## 2023-09-03 MED ORDER — ONDANSETRON HCL 4 MG/2ML IJ SOLN
INTRAMUSCULAR | Status: AC
  Filled 2023-09-03: qty 2

## 2023-09-03 MED ORDER — STERILE WATER FOR IRRIGATION IR SOLN
Status: DC | PRN
  Administered 2023-09-03: 1000 mL

## 2023-09-03 MED ORDER — SODIUM CHLORIDE 0.9 % IV SOLN
500.0000 mg | INTRAVENOUS | Status: AC
  Administered 2023-09-03: 500 mg via INTRAVENOUS
  Filled 2023-09-03: qty 5

## 2023-09-03 MED ORDER — ENOXAPARIN SODIUM 60 MG/0.6ML IJ SOSY
55.0000 mg | PREFILLED_SYRINGE | INTRAMUSCULAR | Status: DC
  Filled 2023-09-03 (×2): qty 0.6

## 2023-09-03 MED ORDER — BUPIVACAINE IN DEXTROSE 0.75-8.25 % IT SOLN
INTRATHECAL | Status: DC | PRN
  Administered 2023-09-03: 1.6 mL via INTRATHECAL

## 2023-09-03 MED ORDER — BUPIVACAINE IN DEXTROSE 0.75-8.25 % IT SOLN
INTRATHECAL | Status: AC
  Filled 2023-09-03: qty 2

## 2023-09-03 MED ORDER — ACETAMINOPHEN 10 MG/ML IV SOLN
INTRAVENOUS | Status: DC | PRN
  Administered 2023-09-03: 1000 mg via INTRAVENOUS

## 2023-09-03 MED ORDER — ACETAMINOPHEN 500 MG PO TABS
1000.0000 mg | ORAL_TABLET | Freq: Four times a day (QID) | ORAL | Status: DC
  Administered 2023-09-04 – 2023-09-06 (×10): 1000 mg via ORAL
  Filled 2023-09-03 (×11): qty 2

## 2023-09-03 MED ORDER — LACTATED RINGERS IV SOLN: INTRAVENOUS | Status: DC | PRN

## 2023-09-03 MED ORDER — FENTANYL CITRATE (PF) 100 MCG/2ML IJ SOLN
INTRAMUSCULAR | Status: AC
  Filled 2023-09-03: qty 2

## 2023-09-03 MED ORDER — PHENYLEPHRINE HCL-NACL 20-0.9 MG/250ML-% IV SOLN
INTRAVENOUS | Status: AC
  Filled 2023-09-03: qty 250

## 2023-09-03 MED ORDER — MORPHINE SULFATE (PF) 0.5 MG/ML IJ SOLN
INTRAMUSCULAR | Status: DC | PRN
  Administered 2023-09-03: 150 ug via INTRATHECAL

## 2023-09-03 MED ORDER — SIMETHICONE 80 MG PO CHEW: 80.0000 mg | CHEWABLE_TABLET | ORAL | Status: DC | PRN

## 2023-09-03 MED ORDER — SODIUM CHLORIDE 0.9 % IV SOLN
INTRAVENOUS | Status: AC
  Filled 2023-09-03: qty 5

## 2023-09-03 MED ORDER — TETANUS-DIPHTH-ACELL PERTUSSIS 5-2.5-18.5 LF-MCG/0.5 IM SUSY: 0.5000 mL | PREFILLED_SYRINGE | Freq: Once | INTRAMUSCULAR | Status: DC

## 2023-09-03 MED ORDER — DIBUCAINE (PERIANAL) 1 % EX OINT: 1.0000 | TOPICAL_OINTMENT | CUTANEOUS | Status: DC | PRN

## 2023-09-03 MED ORDER — CEFAZOLIN SODIUM-DEXTROSE 2-4 GM/100ML-% IV SOLN
2.0000 g | INTRAVENOUS | Status: AC
  Administered 2023-09-03: 2 g via INTRAVENOUS

## 2023-09-03 MED ORDER — DEXAMETHASONE SODIUM PHOSPHATE 10 MG/ML IJ SOLN
INTRAMUSCULAR | Status: DC | PRN
  Administered 2023-09-03: 10 mg via INTRAVENOUS

## 2023-09-03 MED ORDER — DIPHENHYDRAMINE HCL 50 MG/ML IJ SOLN: 12.5000 mg | INTRAMUSCULAR | Status: DC | PRN

## 2023-09-03 MED ORDER — NALOXONE HCL 0.4 MG/ML IJ SOLN: 0.4000 mg | INTRAMUSCULAR | Status: DC | PRN

## 2023-09-03 MED ORDER — LIDOCAINE-EPINEPHRINE (PF) 1.5 %-1:200000 IJ SOLN
INTRAMUSCULAR | Status: DC | PRN
Start: 2023-09-03 — End: 2023-09-03
  Administered 2023-09-03: 3 mL via EPIDURAL

## 2023-09-03 MED ORDER — LACTATED RINGERS IV BOLUS
1000.0000 mL | Freq: Once | INTRAVENOUS | Status: AC
  Administered 2023-09-03: 1000 mL via INTRAVENOUS

## 2023-09-03 MED ORDER — KETOROLAC TROMETHAMINE 30 MG/ML IJ SOLN: 30.0000 mg | Freq: Four times a day (QID) | INTRAMUSCULAR | Status: AC | PRN

## 2023-09-03 MED ORDER — TRANEXAMIC ACID-NACL 1000-0.7 MG/100ML-% IV SOLN
1000.0000 mg | Freq: Once | INTRAVENOUS | Status: AC
  Administered 2023-09-03: 1000 mg via INTRAVENOUS

## 2023-09-03 MED ORDER — WITCH HAZEL-GLYCERIN EX PADS: 1.0000 | MEDICATED_PAD | CUTANEOUS | Status: DC | PRN

## 2023-09-03 MED ORDER — DEXAMETHASONE SODIUM PHOSPHATE 10 MG/ML IJ SOLN
INTRAMUSCULAR | Status: AC
  Filled 2023-09-03: qty 1

## 2023-09-03 MED ORDER — PRENATAL MULTIVITAMIN CH
1.0000 | ORAL_TABLET | Freq: Every day | ORAL | Status: DC
  Administered 2023-09-05: 1 via ORAL
  Filled 2023-09-03: qty 1

## 2023-09-03 MED ORDER — DIPHENHYDRAMINE HCL 25 MG PO CAPS: 25.0000 mg | ORAL_CAPSULE | ORAL | Status: DC | PRN

## 2023-09-03 MED ORDER — OXYTOCIN-SODIUM CHLORIDE 30-0.9 UT/500ML-% IV SOLN
INTRAVENOUS | Status: DC | PRN
  Administered 2023-09-03: 30 [IU] via INTRAVENOUS

## 2023-09-03 MED ORDER — FENTANYL CITRATE (PF) 100 MCG/2ML IJ SOLN
50.0000 ug | Freq: Once | INTRAMUSCULAR | Status: AC
  Administered 2023-09-03: 50 ug via INTRAVENOUS
  Filled 2023-09-03: qty 2

## 2023-09-03 MED ORDER — PHENYLEPHRINE HCL-NACL 20-0.9 MG/250ML-% IV SOLN
INTRAVENOUS | Status: DC | PRN
  Administered 2023-09-03: 60 ug/min via INTRAVENOUS

## 2023-09-03 MED ORDER — DIPHENHYDRAMINE HCL 25 MG PO CAPS: 25.0000 mg | ORAL_CAPSULE | Freq: Four times a day (QID) | ORAL | Status: DC | PRN

## 2023-09-03 MED ORDER — KETOROLAC TROMETHAMINE 30 MG/ML IJ SOLN
30.0000 mg | Freq: Four times a day (QID) | INTRAMUSCULAR | Status: AC
  Administered 2023-09-04: 30 mg via INTRAVENOUS
  Filled 2023-09-03: qty 1

## 2023-09-03 MED ORDER — KETOROLAC TROMETHAMINE 30 MG/ML IJ SOLN
INTRAMUSCULAR | Status: AC
Start: 2023-09-03 — End: ?
  Filled 2023-09-03: qty 1

## 2023-09-03 MED ORDER — OXYTOCIN-SODIUM CHLORIDE 30-0.9 UT/500ML-% IV SOLN
INTRAVENOUS | Status: AC
  Filled 2023-09-03: qty 500

## 2023-09-03 MED ORDER — CEFAZOLIN SODIUM-DEXTROSE 2-4 GM/100ML-% IV SOLN
INTRAVENOUS | Status: AC
  Filled 2023-09-03: qty 100

## 2023-09-03 MED ORDER — ACETAMINOPHEN 10 MG/ML IV SOLN
INTRAVENOUS | Status: AC
Start: 2023-09-03 — End: ?
  Filled 2023-09-03: qty 100

## 2023-09-03 SURGICAL SUPPLY — 32 items
BENZOIN TINCTURE PRP APPL 2/3 (GAUZE/BANDAGES/DRESSINGS) ×1 IMPLANT
CHLORAPREP W/TINT 26 (MISCELLANEOUS) ×2 IMPLANT
CLAMP UMBILICAL CORD (MISCELLANEOUS) ×1 IMPLANT
CLOTH BEACON ORANGE TIMEOUT ST (SAFETY) ×1 IMPLANT
DERMABOND ADVANCED .7 DNX12 (GAUZE/BANDAGES/DRESSINGS) ×1 IMPLANT
DRSG OPSITE POSTOP 4X10 (GAUZE/BANDAGES/DRESSINGS) ×1 IMPLANT
ELECT REM PT RETURN 9FT ADLT (ELECTROSURGICAL) ×1 IMPLANT
ELECTRODE REM PT RTRN 9FT ADLT (ELECTROSURGICAL) ×1 IMPLANT
EXTRACTOR VACUUM KIWI (MISCELLANEOUS) IMPLANT
GLOVE BIOGEL PI IND STRL 7.0 (GLOVE) ×3 IMPLANT
GLOVE ECLIPSE 6.5 STRL STRAW (GLOVE) ×1 IMPLANT
GOWN STRL REUS W/TWL LRG LVL3 (GOWN DISPOSABLE) ×3 IMPLANT
KIT ABG SYR 3ML LUER SLIP (SYRINGE) IMPLANT
MAT PREVALON FULL STRYKER (MISCELLANEOUS) IMPLANT
NDL HYPO 25X5/8 SAFETYGLIDE (NEEDLE) IMPLANT
NEEDLE HYPO 25X5/8 SAFETYGLIDE (NEEDLE) IMPLANT
NS IRRIG 1000ML POUR BTL (IV SOLUTION) ×1 IMPLANT
PACK C SECTION WH (CUSTOM PROCEDURE TRAY) ×1 IMPLANT
PAD ABD 7.5X8 STRL (GAUZE/BANDAGES/DRESSINGS) ×1 IMPLANT
PAD OB MATERNITY 4.3X12.25 (PERSONAL CARE ITEMS) ×1 IMPLANT
RTRCTR C-SECT PINK 25CM LRG (MISCELLANEOUS) ×1 IMPLANT
STRIP CLOSURE SKIN 1/2X4 (GAUZE/BANDAGES/DRESSINGS) IMPLANT
SUT PLAIN 0 NONE (SUTURE) IMPLANT
SUT PLAIN 2 0 XLH (SUTURE) IMPLANT
SUT VIC AB 0 CT1 27XBRD ANBCTR (SUTURE) ×2 IMPLANT
SUT VIC AB 0 CTX36XBRD ANBCTRL (SUTURE) ×3 IMPLANT
SUT VIC AB 2-0 CT1 TAPERPNT 27 (SUTURE) ×1 IMPLANT
SUT VIC AB 3-0 SH 27XBRD (SUTURE) ×1 IMPLANT
SUT VIC AB 4-0 KS 27 (SUTURE) ×1 IMPLANT
TOWEL OR 17X24 6PK STRL BLUE (TOWEL DISPOSABLE) ×1 IMPLANT
TRAY FOLEY W/BAG SLVR 14FR LF (SET/KITS/TRAYS/PACK) IMPLANT
WATER STERILE IRR 1000ML POUR (IV SOLUTION) ×1 IMPLANT

## 2023-09-03 NOTE — Transfer of Care (Signed)
 Immediate Anesthesia Transfer of Care Note  Patient: Yvonne Stone  Procedure(s) Performed: CESAREAN DELIVERY  Patient Location: PACU  Anesthesia Type:Epidural  Level of Consciousness: awake, alert , and oriented  Airway & Oxygen Therapy: Patient Spontanous Breathing  Post-op Assessment: Report given to RN and Post -op Vital signs reviewed and stable  Post vital signs: Reviewed and stable  Last Vitals:  Vitals Value Taken Time  BP 107/69 09/03/23 1909  Temp    Pulse 72 09/03/23 1913  Resp 16 09/03/23 1913  SpO2 94 % 09/03/23 1913  Vitals shown include unfiled device data.  Last Pain:  Vitals:   09/03/23 1629  TempSrc:   PainSc: 7          Complications: No notable events documented.

## 2023-09-03 NOTE — Anesthesia Preprocedure Evaluation (Addendum)
 Anesthesia Evaluation  Patient identified by MRN, date of birth, ID band Patient awake    Reviewed: Allergy & Precautions, NPO status , Patient's Chart, lab work & pertinent test results  History of Anesthesia Complications Negative for: history of anesthetic complications  Airway Mallampati: II  TM Distance: >3 FB Neck ROM: Full    Dental no notable dental hx.    Pulmonary neg pulmonary ROS   Pulmonary exam normal        Cardiovascular negative cardio ROS Normal cardiovascular exam     Neuro/Psych  Headaches  Anxiety Depression       GI/Hepatic negative GI ROS, Neg liver ROS,,,  Endo/Other    Class 3 obesity  Renal/GU negative Renal ROS  negative genitourinary   Musculoskeletal negative musculoskeletal ROS (+)    Abdominal   Peds  Hematology negative hematology ROS (+)   Anesthesia Other Findings Day of surgery medications reviewed with patient.  Reproductive/Obstetrics (+) Pregnancy (Hx of C/S x3)                             Anesthesia Physical Anesthesia Plan  ASA: 3 and emergent  Anesthesia Plan: Combined Spinal and Epidural   Post-op Pain Management: Ofirmev IV (intra-op)*   Induction:   PONV Risk Score and Plan: 4 or greater and Treatment may vary due to age or medical condition, Ondansetron and Dexamethasone  Airway Management Planned: Natural Airway  Additional Equipment: None  Intra-op Plan:   Post-operative Plan:   Informed Consent: I have reviewed the patients History and Physical, chart, labs and discussed the procedure including the risks, benefits and alternatives for the proposed anesthesia with the patient or authorized representative who has indicated his/her understanding and acceptance.       Plan Discussed with: CRNA  Anesthesia Plan Comments:        Anesthesia Quick Evaluation

## 2023-09-03 NOTE — Progress Notes (Signed)
 Plan to proceed with repeat C-section and bilateral salpingectomy due to prior C-section x 3 and desires for permanent sterilization.  Additionally, pt has known right dermoid cyst-  Discussed potential intervention including cyst removal and/or oophorectomy. Risk/benefit/alternatives were addressed- pt would prefer to postpone oophorectomy unless clinically indicated.   Will plan for possible oophorectomy.  The risks of surgery were discussed with the patient including but were not limited to: bleeding which may require transfusion or reoperation; infection which may require antibiotics; injury to bowel, bladder, ureters or other surrounding organs; injury to the fetus; need for additional procedures including hysterectomy in the event of a life-threatening hemorrhage; formation of adhesions; placental abnormalities with subsequent pregnancies; incisional problems; thromboembolic phenomenon and other postoperative/anesthesia complications.  The patient concurred with the proposed plan, giving informed written consent for the procedure.     Patient desires permanent sterilization.  Other reversible forms of contraception were discussed with patient; she declines all other modalities. Risks of procedure discussed with patient including but not limited to: risk of regret, permanence of method, bleeding, infection, and potential injury to surrounding organs.  Discussed that especially in setting of salpingectomy, risk of ectopic pregnancy low, less than 1%.  Also discussed possibility of post-tubal pain syndrome. Patient verbalized understanding of these risks and wants to proceed with sterilization.  Written informed consent obtained.  To OR when ready.  Last ate @ 1000am. Anesthesia and OR aware. Preoperative prophylactic antibiotics and SCDs ordered on call to the OR.  Plan for OR ~ 1800 or as clinically indicated.  Should pt note worsening contractions or cervical change, will plan to proceed  sooner.  Myna Hidalgo, DO Attending Obstetrician & Gynecologist, [facility] [employer], [facility]

## 2023-09-03 NOTE — Op Note (Addendum)
 Yvonne Stone Procedure Date: 09/03/23   PreOp Diagnosis: History of cesarean delivery x 3, preterm labor, undesired fertility PostOp Diagnosis: The same, moderate abdominal adhesive disease, benign appearing right dermoid cyst Procedure: Cesarean section and bilateral salpingectomy Surgeon: Dr. Myna Hidalgo Assistant: Sundra Aland, MD An experienced assistant was required given the standard of surgical care given the complexity of the case.  This assistant was needed for exposure, dissection, suctioning, retraction, instrument exchange, assisting with delivery with administration of fundal pressure, and for overall help during the procedure.  Anesthesia: Anesthesiologist: Kaylyn Layer, MD CRNA: Jennelle Human, CRNA; Donnalee Curry H, CRNA  Complications: None EBL: 244cc UOP: 200cc  Fluids: 1000cc crystalloid  INDICATIONS: History of cesarean delivery x 3, undesired fertility  Findings: Moderate abdominal adhesive disease, prefascial as well as vesicouterine adhesions  PROCEDURE:  Informed consent was obtained from the patient with risks, benefits, complications, treatment options, and expected outcomes discussed with the patient.  The patient concurred with the proposed plan, giving informed consent with form signed.   The patient was taken to Operating Room, and identified with the procedure verified as C-Section Delivery with Time Out. With induction of anesthesia, the patient was prepped and draped in the usual sterile fashion. A Pfannenstiel incision was made over prior incision and carried down through the subcutaneous tissue to the fascia. The fascia was incised in the midline and extended transversely. The superior aspect of the fascial incision was grasped with Kochers elevated and the underlying muscle dissected off. The inferior aspect of the facial incision was in similar fashion, grasped elevated and rectus muscles dissected off. There was difficulty in entering  the peritoneum due to degree of adhesive diease. Peritoneal incision was extended longitudinally. A band of rectus and underlying adhesions were noted and in the desired field. The band was clamped with Kelly clamps then divided and cauterized with suture.  Alexis retractor was placed.  The utero-vesical peritoneal reflection was identified and incised transversely with the [facility] scissors, the incision extended laterally, the bladder flap created digitally. A low transverse uterine incision was made and the infants head delivered atraumatically. After the umbilical cord was clamped and cut cord blood was obtained for evaluation.  The placenta was removed intact and appeared normal. The uterine outline, tubes and ovaries appeared normal. The uterine incision was closed with running locked sutures of 0 Vicryl and a figure-of-eight stitch was placed to ensure hemostasis.  Excellent hemostasis was obtained. Alexis retractor was removed.  Attention was then turned to the adnexa.  The right ovary was noted to have a 6-7cm benign appearing dermoid cyst.  The right fallopian tube was then visualized and grasped with a Babcock clamp.  Using the Babcock clamp the tube was followed all the way down to the fimbriated edge.  A window was made in the mesosalpinx using electrocautery. Kelly clamps were then placed across the lower third of the tube and through the window. This portion of the tube was then removed. Kelly clamps then placed from mesosalpinx window to proximal aspect of fallopian tube. The pedicles were then ligated and sutured using a tie of plain gut.  An additional suture of 2-0 vicryl was placed and excellent hemostasis was confirmed. Attention was turned to the left side and in a similar fashion the lower portion of the right fallopian tube was clamped, cut and suture ligated.  Excellent hemostasis was confirmed.   The fascia was then reapproximated with running sutures of 0 Vicryl. The rectus muscles were  reapproximated using 2-0 Vicryl.  The subcutaneous tissue was reapproximated with 2-0 plain gut suture.  The skin was closed with 4-0 vicryl in a subcuticular fashion.  Instrument, sponge, and needle counts were correct prior the abdominal closure and at the conclusion of the case. The patient was taken to recovery in stable condition.  Sundra Aland, MD OB Fellow, Faculty Practice [facility], [facility] for [facility]

## 2023-09-03 NOTE — Lactation Note (Signed)
 This note was copied from a baby's chart. Lactation Consultation Note  Patient Name: Yvonne Stone [id number] Date: 09/03/2023 Age:32 hours Reason for consult: Initial assessment;Late-preterm 34-36.6wks;Infant < 5lbs (HSV)  P4- Infant was born at [redacted]w[redacted]d weighing 2150g. Infant had 15 mL in post-op, but MOB reports that most of it spilled out her her mouth while using the yellow hospital throw away nipple. MOB has been provided with the white Nfant nipple and Similac Neosure 22 kcal to supplement with. LC reviewed how speech may end up consulting with them as well due to infant's prematurity and low birth weight. LC reviewed how to pace feed side lying in the mean time. LC reviewed the LPI/low birth feeding plan set in place for infant. Both MOB/FOB verbalized understanding of this plan.  Feeding plan as follows: -Entire feeding may last 30 minutes or less (starting with 15 minutes breast followed by 15 minutes of bottle). MOB reports understanding that if infant is not feeding well, then breastfeeding time will be reduced. -Pump after every feeding for 15 minutes to ensure proper breast stimulation and to be able to offer EBM to infant. -Use white Nfant slow flow nipple unless SLP states otherwise. -Supplementation for day 1 is a minimum of 10-12 mL, day 2 is 15-18 mL and day 3 is 20-24 mL.  At this time, infant was having difficulty with her temperature, so the RN was having MOB perform STS to hopefully bring it up. No latch was assessed or attempted at this time. MOB reports knowing how to latch her infant because she has many years of breastfeeding experience. LC set up the hospital DEBP with size 21 mm flanges. MOB was unable to pump due to performing STS with infant and MOB being in pain. LC still reviewed how to work the pump when she is able to. MOB has hearts tattooed as her areola. This is no concern for breastfeeding infant. LC reviewed the possible feeding, temperature and blood sugar  issues infant may experience. LC reviewed the first 24 hr birthday nap, day 2 cluster feeding, feeding infant on cue 8-12x in 24 hrs, not allowing infant to go over 3 hrs without a feeding, CDC milk storage guidelines, LC services handout and engorgement/breast care. MOB does not have a pump for home, nor qualifies for a Stork pump. LC provided MOB information on how to get a pump through [facility]. MOB would benefit from a [facility] loaner pump before discharge. LC encouraged MOB to call for further assistance as needed.  Maternal Data Has patient been taught Hand Expression?: No Does the patient have breastfeeding experience prior to this delivery?: Yes How long did the patient breastfeed?: 10 months with children 1 &2 and 6 months with third child  Feeding Mother's Current Feeding Choice: Breast Milk and Formula Nipple Type: Nfant Standard Flow (white)  Lactation Tools Discussed/Used Tools: Pump;Flanges;Coconut oil Flange Size: 21 Breast pump type: Double-Electric Breast Pump;Manual Pump Education: Setup, frequency, and cleaning;Milk Storage Reason for Pumping: LPI/low birth feeding plan Pumping frequency: 15-20 min every 3 hrs  Interventions Interventions: Breast feeding basics reviewed;Hand pump;DEBP;Education;Pace feeding;LC Services brochure;LPT handout/interventions  Discharge Discharge Education: Engorgement and breast care;Warning signs for feeding baby;Outpatient recommendation Pump: Advised to call insurance company  Consult Status Consult Status: Follow-up Date: 09/04/23 Follow-up type: In-patient    Dema Severin BS, IBCLC 09/03/2023, 9:14 PM

## 2023-09-03 NOTE — Anesthesia Procedure Notes (Signed)
 Epidural Patient location during procedure: OR Start time: 09/03/2023 5:19 PM End time: 09/03/2023 5:22 PM  Staffing Anesthesiologist: Kaylyn Layer, MD Performed: anesthesiologist   Preanesthetic Checklist Completed: patient identified, IV checked, risks and benefits discussed, monitors and equipment checked, pre-op evaluation and timeout performed  Epidural Patient position: sitting Prep: DuraPrep and site prepped and draped Patient monitoring: continuous pulse ox, heart rate and blood pressure Approach: midline Location: L3-L4 Injection technique: LOR air  Needle:  Needle type: Tuohy  Needle gauge: 17 G Needle length: 9 cm Needle insertion depth: 7 cm Catheter type: closed end flexible Catheter size: 19 Gauge Catheter at skin depth: 12 cm Test dose: negative and 1.5% lidocaine with Epi 1:200 K  Assessment Sensory level: T4 Events: blood not aspirated, injection not painful, no injection resistance, no paresthesia and negative IV test  Additional Notes Patient identified. Risks, benefits, and alternatives discussed with patient including but not limited to bleeding, infection, nerve damage, paralysis, failed block, headache, blood pressure changes, nausea, vomiting, reactions to medication, itching, and postpartum back pain. Confirmed the patient's most recent platelet count. Confirmed with patient that they are not currently taking any anticoagulation, have any bleeding history, or any family history of bleeding disorders. Patient expressed understanding and wished to proceed. All questions were answered. Sterile technique was used throughout the entire procedure.    Crisp LOR with Tuohy needle on first pass. Whitacre 25g spinal needle introduced through Tuohy with clear CSF return prior to injection of intrathecal medication. Spinal needle withdrawn and epidural catheter threaded easily. Negative aspiration of catheter for heme or CSF.

## 2023-09-03 NOTE — H&P (Signed)
 OBSTETRIC ADMISSION HISTORY AND PHYSICAL  Yvonne Stone is a 33 y.o. female [phone] with IUP at [redacted]w[redacted]d by first trimester ultrasound presenting for contractions. She reports +FMs, No LOF, no VB, no blurry vision, headaches or peripheral edema, and RUQ pain.  She plans on breast feeding. She request BTL for birth control. She received her prenatal care at [facility]   Dating: By u/s at 8 weeks --->  Estimated Date of Delivery: 10/04/23  Sono:    @[redacted]w[redacted]d , CWD, normal anatomy, cephalic presentation, 2285g, 14% EFW  NURSING  PROVIDER  Office Location [facility] Dating by U/S at 8 wks  [facility] Model Traditional Anatomy U/S Normal female  Initiated care at  [employer]  English              LAB RESULTS   Support Person  Genetics NIPS: declined AFP:     NT/IT (FT only) NT wnl, didn't have 1st IT drawn    Carrier Screen Horizon: declined  Rhogam   N/a A1C/GTT Early: 5.3 Third trimester: wnl  Flu Vaccine Declined 05/22/23     TDaP Vaccine  Declined 3/4 Blood Type  O+  Covid Vaccine  Antibody NO ANTIBODIES DETECTED (11/18 1418)  RSV Vaccine  Rubella 1.43 (11/18 1418)  Feeding Plan breast RPR NON-REACTIVE (11/18 1418)  Contraception Poss PCOS, discuss options, NR if doesn't BF HBsAg NON-REACTIVE (11/18 1418)  Circumcision N/a HIV NON-REACTIVE (11/18 1418)  Pediatrician  [facility] HCVAb  neg  Prenatal Classes discussed      Pap Diagnosis  Date Value Ref Range Status  12/27/2021 (A)  Final   - Atypical squamous cells of undetermined significance (ASC-US)    BTL Consent declined GC/CT Initial:  -/- 36wks:    VBAC Consent  GBS Positive/-- (01/02 1330) For PCN allergy, check sensitivities        DME Rx [ x] BP cuff [ ]  Weight Scale Waterbirth  [ ]  Class [ ]  Consent [ ]  CNM visit  PHQ9 & GAD7 [ x ] new OB [  ] 28 weeks  [  ] 36 weeks Induction  [ ]  Orders Entered [ ] Foley Y/N    Prenatal History/Complications: HSV on suppression, right dermoid cyst  Past Medical  History: Past Medical History:  Diagnosis Date   Depression    Family history of adverse reaction to anesthesia    mom and unsure what   Herpes    genital   History of cesarean section 12/16/2015   Mental disorder    postpartum depression   Migraines    UTI (urinary tract infection) 12/10/2018   + Ecoli, tx'd 7/6 with septra ds    Vaginal Pap smear, abnormal     Past Surgical History: Past Surgical History:  Procedure Laterality Date   CESAREAN SECTION  06/16/2022   CESAREAN SECTION N/A 07/15/2016   Procedure: REPEAT CESAREAN SECTION;  Surgeon: Tilda Burrow, MD;  Location: [facility];  Service: Obstetrics;  Laterality: N/A;   CESAREAN SECTION  02/22/2014    Obstetrical History: OB History     Gravida  5   Para  3   Term  3   Preterm      AB  1   Living  3      SAB  1   IAB      Ectopic      Multiple  0   Live Births  3           Social History Social History   Socioeconomic History   Marital status: Single    Spouse name: Not on file   Number of children: 2   Years of education: Not on file   Highest education level: Not on file  Occupational History   Not on file  Tobacco Use   Smoking status: Never   Smokeless tobacco: Never  Vaping Use   Vaping status: Never Used  Substance and Sexual Activity   Alcohol use: Not Currently    Comment: occ   Drug use: No   Sexual activity: Yes    Birth control/protection: None  Other Topics Concern   Not on file  Social History Narrative   Not on file   Social Drivers of Health   Financial Resource Strain: Low Risk  (03/27/2023)   Overall Financial Resource Strain (CARDIA)    Difficulty of Paying Living Expenses: Not very hard  Food Insecurity: No Food Insecurity (08/17/2023)   Hunger Vital Sign    Worried About Running Out of Food in the Last Year: Never true    Ran Out of Food in the Last Year: Never true  Transportation Needs: No Transportation Needs (08/17/2023)   PRAPARE -  Administrator, Civil Service (Medical): No    Lack of Transportation (Non-Medical): No  Physical Activity: Insufficiently Active (03/27/2023)   Exercise Vital Sign    Days of Exercise per Week: 2 days    Minutes of Exercise per Session: 10 min  Stress: Stress Concern Present (03/27/2023)   [employer] of Occupational Health - Occupational Stress Questionnaire    Feeling of Stress : Very much  Social Connections: Moderately Isolated (03/27/2023)   Social Connection and Isolation Panel [NHANES]    Frequency of Communication with Friends and Family: Twice a week    Frequency of Social Gatherings with Friends and Family: Never    Attends Religious Services: More than 4 times per year    Active Member of [employer] or Organizations: Yes    Attends Engineer, structural: More than 4 times per year    Marital Status: Divorced    Family History: Family History  Problem Relation Age of Onset   Heart murmur Mother    Hypertension Mother    Diabetes Mother    Thyroid disease Mother    Hypertension Father    Urticaria Father    Asthma Father    Heart murmur Sister    Urticaria Sister    Heart murmur Sister    Thyroid disease Maternal Aunt    Hypertension Maternal Aunt    Diabetes Maternal Aunt    Diabetes Maternal Uncle    Hypertension Maternal Uncle    Diabetes Paternal Aunt    Hypertension Paternal Aunt    Hypertension Paternal Uncle    Diabetes Paternal Uncle    Cancer Maternal Grandmother        breast    Hypertension Maternal Grandmother    Hypertension Maternal Grandfather    Seizures Paternal Grandmother    Stroke Paternal Grandmother    Hypertension Paternal Grandmother    Diabetes Paternal Grandfather    Hypertension Paternal Grandfather    Angioedema Neg Hx    Allergic rhinitis Neg Hx    Atopy Neg Hx    Eczema Neg Hx    Immunodeficiency Neg Hx     Allergies: No Known Allergies  Medications Prior to Admission  Medication Sig Dispense  Refill Last Dose/Taking   Prenatal Vit-Fe Fumarate-FA (PRENATAL VITAMIN PO) Take by mouth.   09/02/2023 Morning   acetaminophen-caffeine (EXCEDRIN TENSION HEADACHE) 500-65 MG TABS per tablet Take 2 tablets by mouth every 6 (six) hours as needed. 60 tablet 0    butalbital-acetaminophen-caffeine (FIORICET) 50-325-40 MG tablet Take 1-2 tablets by mouth every 6 (six) hours as needed for headache. 20 tablet 0      Review of Systems   All systems reviewed and negative except as stated in HPI  Blood pressure 119/61, pulse 96, temperature (!) 97.4 F (36.3 C), resp. rate 14, SpO2 100%, not currently breastfeeding. General appearance: alert, cooperative, and no distress Lungs: clear to auscultation bilaterally Heart: regular rate and rhythm Abdomen: soft, non-tender; bowel sounds normal Pelvic: n/a Extremities: Homans sign is negative, no sign of DVT DTR's +2 Presentation: cephalic- confirmed by bedside ultrasound Fetal monitoringBaseline: 130 bpm, Variability: Good {> 6 bpm), Accelerations: Reactive, and Decelerations: Absent Uterine activityFrequency: Every 2-7 minutes Dilation: 3 Effacement (%): 70 Station: Ballotable Exam by:: Antony Odea   Prenatal labs: ABO, Rh:   Antibody: Negative (02/11 0914) Rubella: 1.43 (11/18 1418) RPR: Non Reactive (02/11 0914)  HBsAg: NON-REACTIVE (11/18 1418)  HIV: Non Reactive (02/11 0914)  GBS:      Lab Results  Component Value Date   GBS Positive (A) 06/07/2022   Prenatal Transfer Tool  Maternal Diabetes: No Genetic Screening: Normal Maternal Ultrasounds/Referrals: Normal Fetal Ultrasounds or other Referrals:  None Maternal Substance Abuse:  No Significant Maternal Medications:  None Significant Maternal Lab Results: None Number of Prenatal Visits:greater than 3 verified prenatal visits  No results found for this or any previous visit (from the past 24 hours).  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 [id number] at [redacted]w[redacted]d here for repeat c/s for preterm labor with hx of 3 c/s.  Prepare for OR Care turned over to MD- see MD attestation  Rolm Bookbinder, CNM  09/03/2023, 2:50 PM

## 2023-09-03 NOTE — MAU Note (Signed)
 Yvonne Stone is a 33 y.o. at [redacted]w[redacted]d here in MAU reporting: Pt reports contractions for days. SHe reports worsened since 0828. They are 4-8 minutes apart. Denies LOF, Denies vaginal bleeding, reports +FM   Onset of complaint: today  Pain score: 7/10 abdomen  There were no vitals filed for this visit.    Lab orders placed from triage:  ua

## 2023-09-03 NOTE — Anesthesia Postprocedure Evaluation (Signed)
 Anesthesia Post Note  Patient: Yvonne Stone  Procedure(s) Performed: CESAREAN DELIVERY     Patient location during evaluation: PACU Anesthesia Type: Combined Spinal/Epidural Level of consciousness: awake and alert Pain management: pain level controlled Vital Signs Assessment: post-procedure vital signs reviewed and stable Respiratory status: spontaneous breathing, nonlabored ventilation and respiratory function stable Cardiovascular status: blood pressure returned to baseline Postop Assessment: no apparent nausea or vomiting, spinal receding, no headache and no backache Anesthetic complications: no   No notable events documented.  Last Vitals:  Vitals:   09/03/23 2000 09/03/23 2015  BP: 104/64   Pulse: 71 70  Resp: (!) 22 20  Temp:    SpO2: 97% (!) 78%    Last Pain:  Vitals:   09/03/23 2004  TempSrc:   PainSc: 3    Pain Goal:    LLE Motor Response: Purposeful movement (09/03/23 2000) LLE Sensation: Increased (09/03/23 2000) RLE Motor Response: Purposeful movement (09/03/23 2000) RLE Sensation: Increased (09/03/23 2000)     Epidural/Spinal Function Cutaneous sensation: Able to Discern Pressure (09/03/23 2000), Patient able to flex knees: No (09/03/23 2000), Patient able to lift hips off bed: No (09/03/23 2000), Back pain beyond tenderness at insertion site: No (09/03/23 2000), Progressively worsening motor and/or sensory loss: No (09/03/23 2000), Bowel and/or bladder incontinence post epidural: No (09/03/23 2000)  Shanda Howells

## 2023-09-03 NOTE — Discharge Summary (Shared)
 Postpartum Discharge Summary  Date of Service updated***     Patient Name: Yvonne Stone DOB: Nov 24, 1990 MRN: [id number]  Date of admission: 09/03/2023 Delivery date:09/03/2023 Delivering provider: Myna Hidalgo Date of discharge: 09/03/2023  Admitting diagnosis: Preterm labor in third trimester [O60.03] Intrauterine pregnancy: [redacted]w[redacted]d     Secondary diagnosis:  Principal Problem:   S/P cesarean section Active Problems:   Preterm labor in third trimester   H/O bilateral salpingectomy  Additional problems: ***    Discharge diagnosis: Preterm Pregnancy Delivered                                              Post partum procedures:{Postpartum procedures:23558} Augmentation: N/A Complications: {OB Labor/Delivery Complications:20784}  Hospital course: Onset of Labor With Vaginal Delivery      33 y.o. yo [id number] at [redacted]w[redacted]d was admitted for preterm labor on 09/03/2023. She has history of cesarean delivery x 3, plan for repeat cesarean. Membrane Rupture Time/Date: 5:56 PM,09/03/2023  Delivery Method:C-Section, Low Transverse Operative Delivery:N/A Episiotomy: None Lacerations:  None Patient had a postpartum course complicated by ***.  She is ambulating, tolerating a regular diet, passing flatus, and urinating well. Patient is discharged home in stable condition on 09/03/23.  Newborn Data: Birth date:09/03/2023 Birth time:5:57 PM Gender:Female Living status:Living Apgars:9 ,9  Weight:2150 g  Magnesium Sulfate received: {Mag received:30440022} BMZ received: No Rhophylac:N/A MMR:N/A T-DaP:*** Flu: No RSV Vaccine received: No Transfusion:{Transfusion received:30440034}  Immunizations received: There is no immunization history for the selected administration types on file for this patient.  Physical exam  Vitals:   09/03/23 1415 09/03/23 1420 09/03/23 1425 09/03/23 1430  BP:  109/66    Pulse:  86    Resp:  16    Temp:  98 F (36.7 C)    TempSrc:  Oral    SpO2: 98%  98% 99% 98%   General: {Exam; general:21111117} Lochia: {Desc; appropriate/inappropriate:30686::"appropriate"} Uterine Fundus: {Desc; firm/soft:30687} Incision: {Exam; incision:21111123} DVT Evaluation: {Exam; dvt:2111122} Labs: Lab Results  Component Value Date   WBC 8.5 09/03/2023   HGB 11.2 (L) 09/03/2023   HCT 34.3 (L) 09/03/2023   MCV 83.3 09/03/2023   PLT 254 09/03/2023      Latest Ref Rng & Units 08/17/2023   10:40 PM  CMP  Glucose 70 - 99 mg/dL 89   BUN 6 - 20 mg/dL 7   Creatinine 1.47 - 8.29 mg/dL 5.62   Sodium 130 - 865 mmol/L 136   Potassium 3.5 - 5.1 mmol/L 3.9   Chloride 98 - 111 mmol/L 105   CO2 22 - 32 mmol/L 23   Calcium 8.9 - 10.3 mg/dL 9.1   Total Protein 6.5 - 8.1 g/dL 6.7   Total Bilirubin 0.0 - 1.2 mg/dL 0.2   Alkaline Phos 38 - 126 U/L 121   AST 15 - 41 U/L 22   ALT 0 - 44 U/L 24    Edinburgh Score:    07/20/2022    8:37 AM  Edinburgh Postnatal Depression Scale Screening Tool  I have been able to laugh and see the funny side of things. 1  I have looked forward with enjoyment to things. 1  I have blamed myself unnecessarily when things went wrong. 3  I have been anxious or worried for no good reason. 2  I have felt scared or panicky for no good reason. 2  Things  have been getting on top of me. 0  I have been so unhappy that I have had difficulty sleeping. 0  I have felt sad or miserable. 3  I have been so unhappy that I have been crying. 3  The thought of harming myself has occurred to me. 0  Edinburgh Postnatal Depression Scale Total 15   No data recorded  After visit meds:  Allergies as of 09/03/2023   No Known Allergies   Med Rec must be completed prior to using this [facility]***        Discharge home in stable condition Infant Feeding: {Baby feeding:23562} Infant Disposition:{CHL IP OB HOME WITH JXBJYN:82956} Discharge instruction: per After Visit Summary and Postpartum booklet. Activity: Advance as tolerated. Pelvic rest for  6 weeks.  Diet: {OB OZHY:86578469} Future Appointments: Future Appointments  Date Time Provider Department Center  09/07/2023 10:50 AM Cheral Marker, CNM [facility]   Follow up Visit: Message sent to [facility] 3/30  Please schedule this patient for a In person postpartum visit in 6 weeks with the following provider: Any provider. Additional Postpartum F/U:Postpartum Depression checkup and Incision check 1 week  High risk pregnancy complicated by: hx C/S x 3, hx PPD Delivery mode:  C-Section, Low Transverse Anticipated Birth Control:  BTL done PP   09/03/2023 Sundra Aland, MD

## 2023-09-04 ENCOUNTER — Encounter (HOSPITAL_COMMUNITY): Payer: Self-pay | Admitting: Obstetrics & Gynecology

## 2023-09-04 LAB — RPR: RPR Ser Ql: NONREACTIVE

## 2023-09-04 LAB — CBC
HCT: 33 % — ABNORMAL LOW (ref 36.0–46.0)
Hemoglobin: 10.7 g/dL — ABNORMAL LOW (ref 12.0–15.0)
MCH: 26.4 pg (ref 26.0–34.0)
MCHC: 32.4 g/dL (ref 30.0–36.0)
MCV: 81.3 fL (ref 80.0–100.0)
Platelets: 257 10*3/uL (ref 150–400)
RBC: 4.06 MIL/uL (ref 3.87–5.11)
RDW: 14.5 % (ref 11.5–15.5)
WBC: 11.7 10*3/uL — ABNORMAL HIGH (ref 4.0–10.5)
nRBC: 0 % (ref 0.0–0.2)

## 2023-09-04 LAB — CREATININE, SERUM
Creatinine, Ser: 0.51 mg/dL (ref 0.44–1.00)
GFR, Estimated: >60 mL/min (ref >60)

## 2023-09-04 NOTE — Progress Notes (Signed)
 POSTPARTUM PROGRESS NOTE  Subjective: Yvonne Stone is a 33 y.o. [id number] s/p LTCS at [redacted]w[redacted]d.  She reports she doing well. No acute events overnight. She denies any problems with ambulating, voiding or po intake. Denies nausea or vomiting. She has passed flatus. Pain is well controlled.  Lochia is appropriate.  Objective: Blood pressure 103/75, pulse 75, temperature 98 F (36.7 C), temperature source Oral, resp. rate 18, SpO2 99%, unknown if currently breastfeeding.  Physical Exam:  General: alert, cooperative and no distress Chest: no respiratory distress Abdomen: soft, non-tender  Uterine Fundus: firm and at level of umbilicus Extremities: No calf swelling or tenderness  no edema  Recent Labs    09/03/23 1413 09/04/23 0419  HGB 11.2* 10.7*  HCT 34.3* 33.0*    Assessment/Plan: Yvonne Stone is a 33 y.o. [id number] s/p LTCS at [redacted]w[redacted]d for repeat c section hx 3x c section.  Routine Postpartum Care: Doing well, pain well-controlled.  -- Continue routine care, lactation support  -- Contraception: BTL -- Feeding: breast  Dispo: Plan for discharge tomorrow.  Denton Ar, MD Faculty Practice, [facility] 09/04/2023 9:09 AM

## 2023-09-04 NOTE — Lactation Note (Signed)
 This note was copied from a baby's chart. Lactation Consultation Note  Patient Name: Yvonne Stone Date: 09/04/2023 Age:32 hours  Reason for consult: Follow-up assessment;Infant < 5lbs;Late-preterm 34-36.6wks  P4, [redacted]w[redacted]d, LPTI  Mother states she is latching her baby Yvonne "Yvonne Stone" before giving her formula. She says baby is sleepy and may only suck a few times or none. This is mother's 4th baby but her smallest. She successfully breast fed her other 3 boys and reports producing milk well. Discussed LPTI feeding behavior with breastfeeding. Mother states she has not pumped yet. Encouraged to pump to stimulate her milk production every 3 hours and to avoid engorgement. Mother verbalized understanding.   Discussed late preterm, low birth weight, infant feeding behavior.   Discussed a triple feeding approach: breastfeed with feeding cues and when baby is actively sucking/ feeding at breast. Stop when baby shows fatigue  Supplement with mother's expressed milk and/or formula. Refer to feeding guidelines based on age/ weight left at bedside Pump for 15 min every 3 hours in initiation phase to stimulate milk production  Keep total infant feedings to 30 minutes to decrease infant fatigue. Request assistance as needed   Feeding Mother's Current Feeding Choice: Breast Milk and Formula  Lactation Tools Discussed/Used Reason for Pumping: LPI/ low birth weight Pumped volume:  (mother reports she has not pumped)  Interventions Interventions: Education  Consult Status: Follow-up Date: 09/05/23 Follow-up type: In-patient    Christella Hartigan M 09/04/2023, 3:28 PM

## 2023-09-04 NOTE — Social Work (Signed)
 CSW received consult for hx of Anxiety and Depression and PPD.  CSW met with MOB to offer support and complete assessment. CSW entered the room and observed MOB sitting in the recliner holding the infant. CSW introduced self, CSW role and reason for visit MOB was welcoming and invited CSW to complete assessment. CSW inquired about how MOB was feeling, MOB reported good. CSW inquired about MOB MH hx, MOB reported she was originally diagnosed in 2017 and from then she has experienced "depression and anxiety off and on". CSW inquired about current treatment. MOB reported she was on Celexa when she was first diagnosed but nothing currently. MOB reported she was also in therapy in the past but not currently. MOB reported her symptoms typically increase during pregnancy. CSW inquired  about coping skills, MOB reported she relies on her faith and listens worship music. CSW assessed for safety, MOB denied any SI, HI or DV. CSW provided education regarding the baby blues period vs. perinatal mood disorders, discussed treatment and gave resources for mental health follow up if concerns arise.  CSW recommends self-evaluation during the postpartum time period using the New Mom Checklist from Postpartum Progress and encouraged MOB to contact a medical professional if symptoms are noted at any time.  Mob identified her mom, sister and FOB as her primary supports.   CSW provided review of Sudden Infant Death Syndrome (SIDS) precautions.  MOB identified [facility] for infants follow up care. MOB reported she has all necessary items for the infant including a bassinet and car seat.  CSW identifies no further need for intervention and no barriers to discharge at this time.  Wende Neighbors, LCSWA Clinical Social Worker [phone]

## 2023-09-05 ENCOUNTER — Encounter: Payer: Self-pay | Admitting: Obstetrics & Gynecology

## 2023-09-05 LAB — SURGICAL PATHOLOGY

## 2023-09-05 NOTE — Patient Instructions (Addendum)
 If interested in an outpatient lactation consult in office or virtually please reach out to [place] at [facility] (First Floor) [address], [place] Please call [phone] and press 4 for lactation.

## 2023-09-05 NOTE — Progress Notes (Addendum)
 POSTPARTUM PROGRESS NOTE  Postoperative Day 2  Subjective:  Yvonne Stone is a 32y G5 now P3114 s/p rLTCS at [redacted]w[redacted]d for PTL in setting of hx LTCS x3.  No acute events overnight.  Patient reports she is tired but pain is overall controlled with medications. Bleeding is less than a period. Tolerating regular diet, passing gas, and urinating normally. No BM yet. Feels like she is having a little difficulty ambulating and getting around so would like to stay until tomorrow. Denies any dizziness, headaches, vision changes, CP, heart palpitations, SOB, N/V, RUQ pain, or increased edema.  Objective: T 97.4F, BP 101/62, HR 80, RR 16, satting 100% on RA  Physical Exam:  General: Sitting up in chair, no acute distress, nontoxic in appearance HEENT: Normocephalic, normal external appearance of ears/nose/mouth, EOMI, hearing grossly intact RESP: No increased work of breathing, symmetrical chest rise, able to speak in complete sentences ABD: Soft, minimally tender, nondistended, bandage over incision appears clean and dry Uterine fundus: Firm, minimally tender, just above umbilicus Extremities: Moves all extremities spontaneously, mild difficulty transferring from chair to bed, mild nonpitting BLE edema, nontender, no deformity, no signs of DVT Skin: Warm and dry to palpation, no abnormal rashes/lesions/discoloration of exposed skin Neuro: Alert, oriented, no obvious focal deficits   Assessment/Plan:  Yvonne Stone is a 32y G5 now P3114 s/p rLTCS at [redacted]w[redacted]d for PTL in setting of hx LTCS x3.  --POD2: Routine PP and postoperative care. Hgb 11.2 on admit to 10.7 on POD1. --Hx PPD: Recommend visit for early PP mood check  --Rh pos/GBS unk/girl/breast/BTL  Dispo: Plan for discharge on POD3.   Luvenia Starch, MD 09/05/2023 06:18   Evaluation and management procedures were performed by the [facility] Medicine Resident under my supervision. I was immediately available for direct supervision,  assistance and direction throughout this encounter.  I also confirm that I have verified the information documented in the resident's note, and that I have also personally reperformed the pertinent components of the physical exam and all of the medical decision making activities.  I have also made any necessary editorial changes.   Mittie Bodo, MD Family Medicine - Obstetrics Fellow

## 2023-09-05 NOTE — Plan of Care (Signed)
  Problem: Education: Goal: Knowledge of General Education information will improve Description: Including pain rating scale, medication(s)/side effects and non-pharmacologic comfort measures Outcome: Progressing   Problem: Health Behavior/Discharge Planning: Goal: Ability to manage health-related needs will improve Outcome: Progressing   Problem: Clinical Measurements: Goal: Ability to maintain clinical measurements within normal limits will improve Outcome: Progressing Goal: Will remain free from infection Outcome: Progressing Goal: Diagnostic test results will improve Outcome: Progressing Goal: Respiratory complications will improve Outcome: Progressing Goal: Cardiovascular complication will be avoided Outcome: Progressing   Problem: Activity: Goal: Risk for activity intolerance will decrease Outcome: Progressing   Problem: Nutrition: Goal: Adequate nutrition will be maintained Outcome: Progressing   Problem: Coping: Goal: Level of anxiety will decrease Outcome: Progressing   Problem: Elimination: Goal: Will not experience complications related to bowel motility Outcome: Progressing Goal: Will not experience complications related to urinary retention Outcome: Progressing   Problem: Pain Managment: Goal: General experience of comfort will improve and/or be controlled Outcome: Progressing   Problem: Safety: Goal: Ability to remain free from injury will improve Outcome: Progressing   Problem: Skin Integrity: Goal: Risk for impaired skin integrity will decrease Outcome: Progressing   Problem: Education: Goal: Knowledge of condition will improve Outcome: Progressing Goal: Individualized Educational Video(s) Outcome: Progressing Goal: Individualized Newborn Educational Video(s) Outcome: Progressing   Problem: Activity: Goal: Will verbalize the importance of balancing activity with adequate rest periods Outcome: Progressing Goal: Ability to tolerate increased  activity will improve Outcome: Progressing   Problem: Coping: Goal: Ability to identify and utilize available resources and services will improve Outcome: Progressing   Problem: Life Cycle: Goal: Chance of risk for complications during the postpartum period will decrease Outcome: Progressing   Problem: Role Relationship: Goal: Ability to demonstrate positive interaction with newborn will improve Outcome: Progressing   Problem: Skin Integrity: Goal: Demonstration of wound healing without infection will improve Outcome: Progressing   Problem: Education: Goal: Knowledge of the prescribed therapeutic regimen will improve Outcome: Progressing Goal: Understanding of sexual limitations or changes related to disease process or condition will improve Outcome: Progressing Goal: Individualized Educational Video(s) Outcome: Progressing   Problem: Self-Concept: Goal: Communication of feelings regarding changes in body function or appearance will improve Outcome: Progressing   Problem: Skin Integrity: Goal: Demonstration of wound healing without infection will improve Outcome: Progressing

## 2023-09-05 NOTE — Lactation Note (Signed)
 This note was copied from a baby's chart. Lactation Consultation Note  Patient Name: Yvonne Stone [id number] Date: 09/05/2023 Age:32 hours, P4  Reason for consult: Follow-up assessment;Late-preterm 34-36.6wks;Infant < 5lbs;Infant weight loss;Breastfeeding assistance LC initially visited mom at 0830 and baby wasn't ready to feed.  Mom called the LC back for feeding. Baby wide awake and rooting.  LC offered to see if the baby would latch, mom receptive and baby latched for 10 mins with swallows. After breast feeding. LC reviewed pace feeding with the standard nipple. Baby tolerated well and took 18 ml.  Per mom has not pumped since the DEBP was set up.  LC reviewed the LPT , < 5 pound infant feeding plan, Supply and demand, importance of breast stimulation besides the baby to establish the milk supply.  LC plan: LPT , Less tnan 5 pounds  Feed with feeding cues and by 3 hours ( 8-12 times a day )  If the baby is really sluggish at feeding time, feed fhe supplement 1st and then latch if still hungry.  LC stressed the importance of post pumping after feedings, and save the milk.  See below for [facility] to [facility]   Maternal Data Has patient been taught Hand Expression?: Yes Does the patient have breastfeeding experience prior to this delivery?: Yes How long did the patient breastfeed?: 2 - for 1 year and 1 more than 1 year  Feeding Mother's Current Feeding Choice: Breast Milk and Formula Nipple Type: Nfant Standard Flow (white)  LATCH Score Latch: Grasps breast easily, tongue down, lips flanged, rhythmical sucking.  Audible Swallowing: Spontaneous and intermittent  Type of Nipple: Everted at rest and after stimulation  Comfort (Breast/Nipple): Soft / non-tender  Hold (Positioning): Assistance needed to correctly position infant at breast and maintain latch.  LATCH Score: 9   Lactation Tools Discussed/Used Tools: Pump;Flanges Flange Size: 21 (per mom was sized by the  previous LC) Breast pump type: Double-Electric Breast Pump Pump Education: Setup, frequency, and cleaning;Milk Storage Reason for Pumping:  (mom encouraged to call when she is ready to feed)  Interventions Interventions: Breast feeding basics reviewed;Assisted with latch;Skin to skin;Hand express;Reverse pressure;Breast compression;Adjust position;Support pillows;Position options;DEBP;Education;LC Services brochure;CDC milk storage guidelines;CDC Guidelines for Breast Pump Cleaning  Discharge WIC Program: Yes  Consult Status Consult Status: Follow-up Date: 09/06/23 Follow-up type: In-patient    Matilde Sprang Candy Leverett 09/05/2023, 9:34 AM

## 2023-09-05 NOTE — Social Work (Signed)
 CSW completed assessment with MOB yesterday. CSW addressed MOB's mental Health and EPDS score. CSW provided resources for postpartum. CSW observed no further needs at this time.   Wende Neighbors, LCSWA Clinical Social Worker [phone]

## 2023-09-06 ENCOUNTER — Other Ambulatory Visit (HOSPITAL_COMMUNITY): Payer: Self-pay

## 2023-09-06 MED ORDER — OXYCODONE HCL 5 MG PO TABS: 5.0000 mg | ORAL_TABLET | Freq: Four times a day (QID) | ORAL | 0 refills | Status: DC | PRN

## 2023-09-06 MED ORDER — GABAPENTIN 300 MG PO CAPS
300.0000 mg | ORAL_CAPSULE | Freq: Three times a day (TID) | ORAL | 0 refills | Status: DC
Start: 2023-09-06 — End: 2023-09-06
  Filled 2023-09-06: qty 15, 5d supply, fill #0

## 2023-09-06 MED ORDER — IBUPROFEN 600 MG PO TABS
600.0000 mg | ORAL_TABLET | Freq: Four times a day (QID) | ORAL | 0 refills | Status: DC
  Filled 2023-09-06: qty 30, 8d supply, fill #0

## 2023-09-06 MED ORDER — SENNOSIDES-DOCUSATE SODIUM 8.6-50 MG PO TABS
2.0000 | ORAL_TABLET | Freq: Every day | ORAL | 0 refills | Status: AC
  Filled 2023-09-06: qty 10, 5d supply, fill #0

## 2023-09-06 MED ORDER — SIMETHICONE 80 MG PO CHEW
80.0000 mg | CHEWABLE_TABLET | ORAL | 0 refills | Status: DC | PRN
  Filled 2023-09-06: qty 30, 10d supply, fill #0

## 2023-09-06 NOTE — Lactation Note (Signed)
 This note was copied from a baby's chart. Lactation Consultation Note  Patient Name: Girl Luanne Krzyzanowski [id number] Date: 09/06/2023 Age:32 hours Reason for consult: Follow-up assessment;Maternal discharge;Late-preterm 34-36.6wks  P4, 35 wks, @ 65 hrs of life. Mom using breast and bottle by choice. Experienced breast feeder with older babies, smallest baby. Mom working @ breast, before supplementing. Per mom- has not used DEBP. Anticipated discharge today. Encouraged mom to keep working on big mouth latch with baby and use EBM or coconut oil after each feed. Highlighted use of coconut oil with pumping also acceptable. Discussed cluster feeding overnight/ early morning brings in our milk supply, shared expectations of milk coming in. Highlighted risk of engorgement. Discussed hand pump/express to soften breasts, motrin as anti-inflammatory, and ice packs for 10-20 minutes post feed/pumping if still over-full is the best treatments for inflamed/engorged breasts.   Lactation Tools Discussed/Used Tools: Pump Flange Size: 21 Breast pump type: Manual Pump Education: Milk Storage  Interventions Interventions: Breast feeding basics reviewed;Hand express;Breast compression;Expressed milk;Coconut oil;Hand pump;Education;LC Services brochure;CDC milk storage guidelines  Discharge Discharge Education: Engorgement and breast care Pump: Manual;Personal (Mom going to buy a pump- hand pump provided)  Consult Status Consult Status: Complete Date: 09/06/23 Follow-up type: In-patient    [facility] 09/06/2023, 11:23 AM

## 2023-09-07 ENCOUNTER — Encounter: Admitting: Women's Health

## 2023-09-07 LAB — TYPE AND SCREEN
ABO/RH(D): O POS
Antibody Screen: NEGATIVE
Unit division: 0
Unit division: 0

## 2023-09-07 LAB — BPAM RBC
Blood Product Expiration Date: 202504272359
Blood Product Expiration Date: 202504272359
Unit Type and Rh: 5100
Unit Type and Rh: 5100

## 2023-09-09 ENCOUNTER — Telehealth (HOSPITAL_COMMUNITY): Payer: Self-pay | Admitting: *Deleted

## 2023-09-09 DIAGNOSIS — Z1331 Encounter for screening for depression: Secondary | ICD-10-CM

## 2023-09-09 NOTE — Telephone Encounter (Signed)
 Hospital inpatient EPDS=16. Patient answered "never" to question #10. CSW consult completed during stay. Ambulatory IBH referral made now, per protocol, and Dr. Earlene Plater notified via chart. Paulene Floor, RN, 09/09/23, [phone]

## 2023-09-12 ENCOUNTER — Ambulatory Visit: Admitting: Women's Health

## 2023-09-12 ENCOUNTER — Encounter: Payer: Self-pay | Admitting: Women's Health

## 2023-09-12 VITALS — BP 121/85 | HR 73 | Ht 65.0 in | Wt 249.0 lb

## 2023-09-12 DIAGNOSIS — R600 Localized edema: Secondary | ICD-10-CM

## 2023-09-12 DIAGNOSIS — Z4889 Encounter for other specified surgical aftercare: Secondary | ICD-10-CM

## 2023-09-12 DIAGNOSIS — Z98891 History of uterine scar from previous surgery: Secondary | ICD-10-CM

## 2023-09-12 MED ORDER — HYDROCHLOROTHIAZIDE 12.5 MG PO TABS: 12.5000 mg | ORAL_TABLET | Freq: Every day | ORAL | 0 refills | Status: DC

## 2023-09-12 NOTE — Progress Notes (Signed)
   GYN VISIT Patient name: Yvonne Stone MRN [id number]  Date of birth: 28-Jul-1990 Chief Complaint:   Routine Post Op  History of Present Illness:   Yvonne Stone is a 33 y.o. [id number] African-American female 9d s/p RCS w/ BTS being seen today for incision check.  Bottlefeeding. Denies ppd/anxiety. Bad swelling in legs.  No LMP recorded (lmp unknown).     03/27/2023   11:07 AM 08/09/2022    2:31 PM 12/27/2021    2:46 PM 12/16/2016   11:50 AM 12/16/2016   11:49 AM  Depression screen PHQ 2/9  Decreased Interest 3 2 2 1 1   Down, Depressed, Hopeless 3 1 2 1 1   PHQ - 2 Score 6 3 4 2 2   Altered sleeping 3 3 2 2    Tired, decreased energy 3 3 2 2    Change in appetite 3 0 2 2   Feeling bad or failure about yourself  3 1 2 1    Trouble concentrating 3 0 2 1   Moving slowly or fidgety/restless 1 0 0 1   Suicidal thoughts 0 0 0 0   PHQ-9 Score 22 10 14 11          03/27/2023   11:07 AM 08/09/2022    2:33 PM 12/27/2021    2:46 PM  GAD 7 : Generalized Anxiety Score  Nervous, Anxious, on Edge 3 1 2   Control/stop worrying 3 3 2   Worry too much - different things 3 2 2   Trouble relaxing 3 3 2   Restless 1 0 0  Easily annoyed or irritable 2 3 2   Afraid - awful might happen 2 1 2   Total GAD 7 Score 17 13 12      Review of Systems:   Pertinent items are noted in HPI Denies fever/chills, dizziness, headaches, visual disturbances, fatigue, shortness of breath, chest pain, abdominal pain, vomiting, abnormal vaginal discharge/itching/odor/irritation, problems with periods, bowel movements, urination, or intercourse unless otherwise stated above.  Pertinent History Reviewed:  Reviewed past medical,surgical, social, obstetrical and family history.  Reviewed problem list, medications and allergies. Physical Assessment:   Vitals:   09/12/23 1111  BP: 121/85  Pulse: 73  Weight: 249 lb (112.9 kg)  Height: 5\' 5"  (1.651 m)  Body mass index is 41.44 kg/m.       Physical Examination:    General appearance: alert, well appearing, and in no distress  Mental status: alert, oriented to person, place, and time  Skin: warm & dry   Cardiovascular: normal heart rate noted  Respiratory: normal respiratory effort, no distress  Abdomen: soft, non-tender, honeycomb removed, incision healing well, no s/s infection  Pelvic: examination not indicated  Extremities: 1+ BLE edema, no erythema/cords, -Homans  Chaperone: N/A  No results found for this or any previous visit (from the past 24 hours).  Assessment & Plan:  1) 9d s/p RCS w/ BTS> healing well, keep clean and dry  2) BLE edema> rx hydrochlorothiazide x 1wk  3) Rt dermoid 7cm> was not removed at C/S  Meds: No orders of the defined types were placed in this encounter.   No orders of the defined types were placed in this encounter.   No follow-ups on file.  Cheral Marker CNM, [facility] 09/12/2023 11:30 AM

## 2023-09-21 ENCOUNTER — Telehealth: Payer: Self-pay | Admitting: Clinical

## 2023-09-21 NOTE — Telephone Encounter (Signed)
Attempt call regarding referral; Left HIPPA-compliant message to call back Mikle Sternberg from [facility] at  [phone] (Ashelyn Mccravy's office).    

## 2023-09-22 ENCOUNTER — Encounter: Payer: Self-pay | Admitting: Women's Health

## 2023-10-11 ENCOUNTER — Encounter: Payer: Self-pay | Admitting: Advanced Practice Midwife

## 2023-10-11 ENCOUNTER — Ambulatory Visit (INDEPENDENT_AMBULATORY_CARE_PROVIDER_SITE_OTHER): Payer: Self-pay | Admitting: Advanced Practice Midwife

## 2023-10-11 DIAGNOSIS — N838 Other noninflammatory disorders of ovary, fallopian tube and broad ligament: Secondary | ICD-10-CM

## 2023-10-11 NOTE — Progress Notes (Signed)
 POSTPARTUM VISIT Patient name: Yvonne Stone MRN [id number]  Date of birth: 01-21-1991 Chief Complaint:   Postpartum Care (C section & tubal March 30)  History of Present Illness:   Yvonne Stone is a 33 y.o. [id number] African American female being seen today for a postpartum visit. She is  5.5  weeks postpartum following a repeat cesarean section, low transverse incision & bilat salpingectomy at 35.4 gestational weeks. IOL: no.  Anesthesia: spinal.  Laceration: n/a.  Complications: preterm delivery. Inpatient contraception: yes BTS .   Pregnancy complicated by c/s x 3; dermoid cyst . Tobacco use: no. Substance use disorder: no. Last pap smear: July 2023 and results were ASCUS w/ HRHPV negative. Next pap smear due: July 2026 No LMP recorded (lmp unknown).  Postpartum course has been uncomplicated. Bleeding none. Bowel function is  remarkable for some constipation relieved by stool softener . Bladder function is normal. Urinary incontinence? no, fecal incontinence? no Last sexual activity: didn't ask.  Desired contraception: s/p bilat salpingectomy. Patient does not want a pregnancy in the future.  Desired family size is 4 children.   The pregnancy intention screening data noted above was reviewed. Potential methods of contraception were discussed. The patient elected to proceed with No data recorded.  Edinburgh Postpartum Depression Screening: positive: H/O mental health disorder: yes hx dep/anx. Currently on meds: no.  Currently in therapy: no.  Sleeping: as expected.  Appetite: nl.  Still finds joy in things she used to: Yes.  Support at home: sometimes her mom.  SI/HI/II: no.  Interested in medicine: No.  Interested in therapy: No.  Edinburgh Postnatal Depression Scale - 10/11/23 1103       Edinburgh Postnatal Depression Scale:  In the Past 7 Days   I have been able to laugh and see the funny side of things. 0    I have looked forward with enjoyment to things. 1    I have blamed  myself unnecessarily when things went wrong. 2    I have been anxious or worried for no good reason. 2    I have felt scared or panicky for no good reason. 2    Things have been getting on top of me. 2    I have been so unhappy that I have had difficulty sleeping. 1    I have felt sad or miserable. 1    I have been so unhappy that I have been crying. 1    The thought of harming myself has occurred to me. 0    Edinburgh Postnatal Depression Scale Total 12                03/27/2023   11:07 AM 08/09/2022    2:33 PM 12/27/2021    2:46 PM  GAD 7 : Generalized Anxiety Score  Nervous, Anxious, on Edge 3 1 2   Control/stop worrying 3 3 2   Worry too much - different things 3 2 2   Trouble relaxing 3 3 2   Restless 1 0 0  Easily annoyed or irritable 2 3 2   Afraid - awful might happen 2 1 2   Total GAD 7 Score 17 13 12      Baby's course has been uncomplicated (was 35.4wks, but didn't require NICU stay). Baby is feeding by bottle. Infant has a pediatrician/family doctor? Yes.  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  Pt has material needs met for her and baby: Yes.   Review of Systems:   Pertinent items are noted in HPI  Denies Abnormal vaginal discharge w/ itching/odor/irritation, headaches, visual changes, shortness of breath, chest pain, abdominal pain, severe nausea/vomiting, or problems with urination or bowel movements. Pertinent History Reviewed:  Reviewed past medical,surgical, obstetrical and family history.  Reviewed problem list, medications and allergies. OB History  Gravida Para Term Preterm AB Living  5 4 3 1 1 4   SAB IAB Ectopic Multiple Live Births  1   0 4    # Outcome Date GA Lbr Len/2nd Weight Sex Type Anes PTL Lv  5 Preterm 09/03/23 [redacted]w[redacted]d  4 lb 11.8 oz (2.15 kg) F CS-LTranv Spinal, EPI  LIV  4 Term 06/16/22 [redacted]w[redacted]d  6 lb (2.722 kg) M CS-LTranv Spinal N LIV  3 Term 07/15/16 [redacted]w[redacted]d  7 lb 2.8 oz (3.255 kg) M CS-LTranv Spinal  LIV  2 Term 02/22/14 [redacted]w[redacted]d  6  lb 10 oz (3.005 kg) M CS-LTranv  N LIV     Complications: Fetal Intolerance  1 SAB            Physical Assessment:   Vitals:   10/11/23 1106  BP: 107/74  Pulse: 69  Weight: 233 lb 8 oz (105.9 kg)  Height: 5\' 5"  (1.651 m)  Body mass index is 38.86 kg/m.       Physical Examination:   General appearance: alert, well appearing, and in no distress  Mental status: alert, oriented to person, place, and time  Skin: warm & dry   Cardiovascular: normal heart rate noted   Respiratory: normal respiratory effort, no distress   Breasts: deferred, no complaints   Abdomen: soft, non-tender; LTCS incision has healed very well   Pelvic: examination not indicated. Thin prep pap obtained: No  Rectal: not examined  Extremities: Edema: Trace         No results found for this or any previous visit (from the past 24 hours).  Assessment & Plan:  1) Postpartum exam 2) 5.5 wks s/p repeat cesarean section, low transverse incision 3) bottle feeding 4) Depression screening: positive EPDS 12; declines meds or therapy; feels stable 5) Contraception: s/p bilat salpingectomy 6) Benign dermoid cyst not removed  Essential components of care per ACOG recommendations:  1.  Mood and well being:  If positive depression screen, discussed and plan developed.  If using tobacco we discussed reduction/cessation and risk of relapse If current substance abuse, we discussed and referral to local resources was offered.   2. Infant care and feeding:  If breastfeeding, discussed returning to work, pumping, breastfeeding-associated pain, guidance regarding return to fertility while lactating if not using another method. If needed, patient was provided with a letter to be allowed to pump q 2-3hrs to support lactation in a private location with access to a refrigerator to store breastmilk.   Recommended that all caregivers be immunized for flu, pertussis and other preventable communicable diseases If pt does not have  material needs met for her/baby, referred to local resources for help obtaining these.  3. Sexuality, contraception and birth spacing Provided guidance regarding sexuality, management of dyspareunia, and resumption of intercourse Discussed avoiding interpregnancy interval <48mths and recommended birth spacing of 18 months  4. Sleep and fatigue Discussed coping options for fatigue and sleep disruption Encouraged family/partner/community support of 4 hrs of uninterrupted sleep to help with mood and fatigue  5. Physical recovery  If pt had a C/S, assessed incisional pain and providing guidance on normal vs prolonged recovery If pt had a laceration, perineal healing and pain reviewed.  If urinary or fecal incontinence,  discussed management and referred to PT or uro/gyn if indicated  Patient is safe to resume physical activity. Discussed attainment of healthy weight.  6.  Chronic disease management Discussed pregnancy complications if any, and their implications for future childbearing and long-term maternal health. Review recommendations for prevention of recurrent pregnancy complications, such as 17 hydroxyprogesterone caproate to reduce risk for recurrent PTB not applicable, or aspirin  to reduce risk of preeclampsia not applicable. Pt had GDM: no. If yes, 2hr GTT scheduled: not applicable. Reviewed medications and non-pregnant dosing including consideration of whether pt is breastfeeding using a reliable resource such as LactMed: not applicable Referred for f/u w/ PCP or subspecialist providers as indicated: is a pt at [facility]  7. Health maintenance Mammogram at 32yo or earlier if indicated Pap smears as indicated  Meds: No orders of the defined types were placed in this encounter.   Follow-up: Return for prn.   No orders of the defined types were placed in this encounter.   Jolayne Natter CNM 10/11/2023 11:28 AM

## 2023-10-11 NOTE — Patient Instructions (Signed)
Use the website www.postpartum.net for helpful resources!
